# Patient Record
Sex: Female | Born: 1960 | Race: White | Hispanic: No | Smoking: Current every day smoker
Health system: Southern US, Community
[De-identification: ages and names within clinical notes are randomized; demographics above are authoritative.]

## PROBLEM LIST (undated history)

## (undated) DIAGNOSIS — F32A Depression, unspecified: Secondary | ICD-10-CM

## (undated) DIAGNOSIS — M1990 Unspecified osteoarthritis, unspecified site: Secondary | ICD-10-CM

## (undated) DIAGNOSIS — T7840XA Allergy, unspecified, initial encounter: Secondary | ICD-10-CM

## (undated) DIAGNOSIS — R197 Diarrhea, unspecified: Secondary | ICD-10-CM

## (undated) DIAGNOSIS — Z79890 Hormone replacement therapy (postmenopausal): Secondary | ICD-10-CM

## (undated) DIAGNOSIS — R112 Nausea with vomiting, unspecified: Secondary | ICD-10-CM

## (undated) DIAGNOSIS — K5900 Constipation, unspecified: Secondary | ICD-10-CM

## (undated) DIAGNOSIS — I509 Heart failure, unspecified: Secondary | ICD-10-CM

## (undated) DIAGNOSIS — F329 Major depressive disorder, single episode, unspecified: Secondary | ICD-10-CM

## (undated) DIAGNOSIS — Z9889 Other specified postprocedural states: Secondary | ICD-10-CM

## (undated) DIAGNOSIS — M797 Fibromyalgia: Secondary | ICD-10-CM

## (undated) HISTORY — PX: APPENDECTOMY: SHX54

## (undated) HISTORY — PX: VAGINAL HYSTERECTOMY: SUR661

## (undated) HISTORY — DX: Allergy, unspecified, initial encounter: T78.40XA

## (undated) HISTORY — DX: Heart failure, unspecified: I50.9

## (undated) HISTORY — DX: Unspecified osteoarthritis, unspecified site: M19.90

## (undated) HISTORY — PX: CHOLECYSTECTOMY: SHX55

## (undated) HISTORY — PX: RADICAL VAGINAL HYSTERECTOMY: SUR662

## (undated) HISTORY — PX: TONSILLECTOMY: SUR1361

## (undated) HISTORY — PX: COLONOSCOPY: SHX174

---

## 2006-01-17 ENCOUNTER — Ambulatory Visit: Payer: Self-pay | Admitting: Family Medicine

## 2006-04-16 ENCOUNTER — Ambulatory Visit: Payer: Self-pay | Admitting: Family Medicine

## 2006-04-25 ENCOUNTER — Ambulatory Visit: Payer: Self-pay | Admitting: Family Medicine

## 2006-07-31 ENCOUNTER — Ambulatory Visit: Payer: Self-pay | Admitting: Family Medicine

## 2006-09-30 ENCOUNTER — Ambulatory Visit: Payer: Self-pay | Admitting: Family Medicine

## 2006-10-07 ENCOUNTER — Ambulatory Visit: Payer: Self-pay | Admitting: Family Medicine

## 2006-12-31 ENCOUNTER — Ambulatory Visit: Payer: Self-pay | Admitting: Family Medicine

## 2007-02-11 ENCOUNTER — Ambulatory Visit: Payer: Self-pay | Admitting: Family Medicine

## 2011-03-08 ENCOUNTER — Other Ambulatory Visit: Payer: Self-pay

## 2012-01-24 ENCOUNTER — Observation Stay (HOSPITAL_COMMUNITY)
Admission: EM | Admit: 2012-01-24 | Discharge: 2012-01-24 | Disposition: A | Discharge status: Home / Self Care | Payer: Self-pay | Attending: Emergency Medicine | Admitting: Emergency Medicine

## 2012-01-24 ENCOUNTER — Observation Stay (HOSPITAL_COMMUNITY): Payer: Self-pay

## 2012-01-24 ENCOUNTER — Emergency Department (HOSPITAL_COMMUNITY): Payer: Self-pay

## 2012-01-24 ENCOUNTER — Encounter (HOSPITAL_COMMUNITY): Payer: Self-pay | Admitting: Emergency Medicine

## 2012-01-24 DIAGNOSIS — R079 Chest pain, unspecified: Principal | ICD-10-CM

## 2012-01-24 DIAGNOSIS — R42 Dizziness and giddiness: Secondary | ICD-10-CM | POA: Insufficient documentation

## 2012-01-24 HISTORY — DX: Fibromyalgia: M79.7

## 2012-01-24 HISTORY — DX: Hormone replacement therapy: Z79.890

## 2012-01-24 LAB — DIFFERENTIAL
Basophils Absolute: 0 10*3/uL (ref 0.0–0.1)
Basophils Relative: 1 % (ref 0–1)
Eosinophils Absolute: 0.1 10*3/uL (ref 0.0–0.7)
Monocytes Absolute: 0.5 10*3/uL (ref 0.1–1.0)
Neutro Abs: 3.3 10*3/uL (ref 1.7–7.7)
Neutrophils Relative %: 51 % (ref 43–77)

## 2012-01-24 LAB — BASIC METABOLIC PANEL
BUN: 12 mg/dL (ref 6–23)
Calcium: 8.9 mg/dL (ref 8.4–10.5)
Chloride: 107 mEq/L (ref 96–112)
Creatinine, Ser: 1.01 mg/dL (ref 0.50–1.10)
GFR calc Af Amer: 73 mL/min — ABNORMAL LOW (ref >90)
GFR calc non Af Amer: 63 mL/min — ABNORMAL LOW (ref >90)

## 2012-01-24 LAB — CBC
HCT: 35.4 % — ABNORMAL LOW (ref 36.0–46.0)
MCH: 33.1 pg (ref 26.0–34.0)
MCHC: 34.7 g/dL (ref 30.0–36.0)
RDW: 12.6 % (ref 11.5–15.5)

## 2012-01-24 LAB — TROPONIN I: Troponin I: <0.3 ng/mL (ref <0.30)

## 2012-01-24 MED ORDER — SODIUM CHLORIDE 0.9 % IV SOLN
1000.0000 mL | INTRAVENOUS | Status: DC
  Administered 2012-01-24: 1000 mL via INTRAVENOUS

## 2012-01-24 MED ORDER — IOHEXOL 350 MG/ML SOLN
80.0000 mL | Freq: Once | INTRAVENOUS | Status: AC | PRN
  Administered 2012-01-24: 80 mL via INTRAVENOUS

## 2012-01-24 MED ORDER — ASPIRIN 81 MG PO TBEC: 81.0000 mg | DELAYED_RELEASE_TABLET | Freq: Every day | ORAL | Status: AC

## 2012-01-24 MED ORDER — METOPROLOL TARTRATE 1 MG/ML IV SOLN
INTRAVENOUS | Status: AC
  Filled 2012-01-24: qty 15

## 2012-01-24 MED ORDER — METOPROLOL TARTRATE 25 MG PO TABS
50.0000 mg | ORAL_TABLET | Freq: Once | ORAL | Status: AC
  Administered 2012-01-24: 50 mg via ORAL
  Filled 2012-01-24: qty 2

## 2012-01-24 MED ORDER — NITROGLYCERIN 0.4 MG SL SUBL
0.4000 mg | SUBLINGUAL_TABLET | SUBLINGUAL | Status: DC | PRN
  Administered 2012-01-24: 0.4 mg via SUBLINGUAL
  Filled 2012-01-24: qty 25

## 2012-01-24 MED ORDER — SODIUM CHLORIDE 0.9 % IV SOLN
Freq: Once | INTRAVENOUS | Status: AC
  Administered 2012-01-24: 12:00:00 via INTRAVENOUS

## 2012-01-24 MED ORDER — NITROGLYCERIN 0.4 MG SL SUBL
0.4000 mg | SUBLINGUAL_TABLET | Freq: Once | SUBLINGUAL | Status: AC
  Administered 2012-01-24: 0.4 mg via SUBLINGUAL

## 2012-01-24 NOTE — ED Provider Notes (Signed)
Medical screening examination/treatment/procedure(s) were performed by non-physician practitioner and as supervising physician I was immediately available for consultation/collaboration.   Gavin Pound. Oletta Lamas, MD 01/24/12 1610

## 2012-01-24 NOTE — ED Provider Notes (Signed)
I have seen and examined this patient with the resident.  I agree with the resident's note, assessment and plan except as indicated.    Patient with 2 episodes of chest pain yesterday and today.  She did have some associated nausea.  They may be somewhat worse with exertion but patient is unclear.  She has no chest pain at this time.  She has a normal EKG.  Patient is low risk for coronary disease except for the fact that she smokes.  Given that patient has a normal EKG and is low risk but does have a story that is somewhat typical for ACS we are significantly concerned to obtain further studies on this patient.  If her troponins are negative we will plan to place her in the CDU chest pain protocol to obtain a coronary CTA patient's BMI is low enough, her heart weight is in the 50s to 60s, she has no IV dye allergy, patient is amenable to this plan.  Nat Christen, MD 01/24/12 1236

## 2012-01-24 NOTE — ED Provider Notes (Signed)
Need delta troponin, follows with coronary ct to r/o cardiac related cp.    3:04 PM Pt currently CP free.  Delta troponin unremarkable.  Coronary CT ordered.  Metoprolol given.  Care discussed with Felicie Morn, NP who will continue management.    Fayrene Helper, PA-C 01/24/12 1505

## 2012-01-24 NOTE — ED Notes (Signed)
Pt transported to Xray. 

## 2012-01-24 NOTE — ED Notes (Signed)
Pt returned from xray

## 2012-01-24 NOTE — ED Notes (Addendum)
Pt with substernal chest pain, non-radiating that began yesterday after recent work stressors. Pain worse with deep inspiration in left chest. Today experienced some nausea but no vomiting, near syncope episode prior to EMS arrival. 12 lead ekg showed sinus rhythm. Patient hyperventilating on arrival to ED. CBG 137. Smoker. Lost 18lbs since Jan. Recently treated for bronchitis. 18g RAC. 324mg  ASA PTA, 2 SL nitro changed pain from 5 to 3 in route.

## 2012-01-24 NOTE — ED Notes (Signed)
Pt in ct with RN.

## 2012-01-24 NOTE — Discharge Instructions (Signed)
Chest Pain Observation  It is often hard to give a specific diagnosis for the cause of chest pain. Your symptoms had a chance of being caused by inadequate oxygen delivery to your heart (angina). Angina that is not treated or evaluated can lead to a heart attack (myocardial infarction, MI) or death.  Blood tests, electrocardiograms, and X-rays may have been done to help determine a possible cause of your chest pain. After evaluation and observation, your caregiver has determined that it is unlikely your pain was caused by angina. However, a full evaluation of your pain needs to be completed. You need to follow up with caregivers or diagnostic testing as directed. It is very important to keep your follow-up appointments. Not keeping your follow-up appointments could result in permanent heart damage, disability, or death. If there is any problem keeping your follow-up appointments, you must call your caregiver.  HOME CARE INSTRUCTIONS   Due to the slight chance that your pain could be angina, it is important to follow healthy lifestyle habits and follow your caregiver's treatment plan:   Maintain a healthy weight.   Stay physically active and exercise regularly.   Decrease your salt intake.   Eat a diet low in saturated fats and cholesterol. Avoid foods fried in oil or made with fat. Talk to a dietician to learn about heart healthy foods.   Increase your fiber intake by including whole grains, vegetable, and fruits in your diet.   Avoid situations that cause stress, anger, or depression.   Take medication as advised by your caregiver. Report any side effects to your caregiver. Do not stop medications or adjust the dosages on your own.   Quit smoking. Do not use nicotine patches or gum until you check with your caregiver.   Keep your blood pressure, blood sugar, and cholesterol levels within normal limits.   Limit alcohol intake to no more than 1 drink per day for nonpregnant women and 2 drinks per day for  men.   Stop abusing drugs.  SEEK MEDICAL CARE IF:  You have severe chest pain or pressure which may include symptoms such as:   Pain or pressure in the arms, neck, jaw, or back.   Profuse sweating.   Feeling sick to your stomach (nauseous).   Feeling short of breath while at rest.   Having a fast or irregular heartbeat.   You have chest pain that does not get better after rest or after taking your usual medicine.   You wake from sleep with chest pain.   You feel dizzy, faint, or experience extreme fatigue.   You notice increasing shortness of breath during rest, sleep, or with activity.   You are unable to sleep because you cannot breathe.   You develop a frequent cough or you are coughing up blood.   You have severe back or abdominal pain, are nauseated, or throw up (vomit).   You develop severe weakness, dizziness, fainting, or chills.  Any of these symptoms may represent a serious problem that is an emergency. Do not wait to see if the symptoms will go away. Call your local emergency services (911 in the [place] Do not drive yourself to the hospital.  MAKE SURE YOU:   Understand these instructions.   Will watch your condition.   Will get help right away if you are not doing well or get worse.  Document Released: 09/15/2010 Document Revised: 08/02/2011 Document Reviewed: 09/15/2010  Exit[employer] Patient Information 2012 [employer].

## 2012-01-24 NOTE — ED Provider Notes (Signed)
Patient in CDU under chest pain protocol.  Testing completed, results reviewed and discussed with Dr. Oletta Lamas and with patient.  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  Lungs CTA bilaterally, s1/s2, RRR, no murmur.  Abdomen soft, bowel sounds present.  Strong distal pulses all extremities.  ECG without indication of ischemia.  Patient to follow-up with her PCP ([facility] [place]).  Jimmye Norman, NP 01/24/12 1901

## 2012-01-24 NOTE — ED Notes (Addendum)
Dr. Reche Dixon speaking with pt in CT.

## 2012-01-24 NOTE — ED Notes (Signed)
Pt updated on delays ?

## 2012-01-24 NOTE — ED Provider Notes (Signed)
History     CSN: [id number]  Arrival date & time 01/24/12  1023   None     Chief Complaint  Patient presents with  . Chest Pain    (Consider location/radiation/quality/duration/timing/severity/associated sxs/prior treatment) Patient is a 51 y.o. female presenting with chest pain. The history is provided by the patient.  Chest Pain The chest pain began yesterday. Chest pain occurs intermittently. The chest pain is unchanged. Associated with: nothing. At its most intense, the pain is at 5/10. The pain is currently at 4/10. The severity of the pain is mild. The quality of the pain is described as pressure-like (left chest). The pain does not radiate. Exacerbated by: nothing. Primary symptoms include cough (mild chronic). Pertinent negatives for primary symptoms include no fever, no fatigue, no shortness of breath, no abdominal pain, no nausea, no vomiting and no dizziness. She tried nitroglycerin for the symptoms. Risk factors: smoker.     Past Medical History  Diagnosis Date  . Fibromyalgia   . Bronchitis   . Hormone replacement therapy, postmenopausal     Past Surgical History  Procedure Date  . Vaginal hysterectomy   . Cholecystectomy     History reviewed. No pertinent family history.  History  Substance Use Topics  . Smoking status: Current Everyday Smoker  . Smokeless tobacco: Not on file  . Alcohol Use: Yes     socially    OB History    Grav Para Term Preterm Abortions TAB SAB Ect Mult Living                  Review of Systems  Constitutional: Negative for fever and fatigue.  HENT: Negative for congestion, drooling and neck pain.   Eyes: Negative for pain.  Respiratory: Positive for cough (mild chronic). Negative for shortness of breath.   Cardiovascular: Positive for chest pain.  Gastrointestinal: Negative for nausea, vomiting, abdominal pain and diarrhea.  Genitourinary: Negative for dysuria and hematuria.  Musculoskeletal: Negative for back pain and  gait problem.  Skin: Negative for color change.  Neurological: Negative for dizziness and headaches.  Hematological: Negative for adenopathy.  Psychiatric/Behavioral: Negative for behavioral problems.  All other systems reviewed and are negative.    Allergies  Penicillins  Home Medications   Current Outpatient Rx  Name Route Sig Dispense Refill  . ESTRADIOL 1 MG PO TABS Oral Take 1 mg by mouth daily.    [name] HYDROCODONE-ACETAMINOPHEN 5-500 MG PO TABS Oral Take 1 tablet by mouth every 6 (six) hours as needed. For pain associated with fibromyalgia    . LORAZEPAM 0.5 MG PO TABS Oral Take 0.5 mg by mouth at bedtime as needed. For muscle relaxant associated with fibromyalgia    . MILNACIPRAN HCL 25 MG PO TABS Oral Take 25 mg by mouth 2 (two) times daily.      BP 117/73  Pulse 65  Temp(Src) 97 F (36.1 C) (Oral)  Resp 14  SpO2 97%  Physical Exam  Nursing note and vitals reviewed. Constitutional: She is oriented to person, place, and time. She appears well-developed and well-nourished.  HENT:  Head: Normocephalic.  Mouth/Throat: No oropharyngeal exudate.  Eyes: Conjunctivae and EOM are normal. Pupils are equal, round, and reactive to light.  Neck: Normal range of motion. Neck supple.  Cardiovascular: Normal rate, regular rhythm, normal heart sounds and intact distal pulses.  Exam reveals no gallop and no friction rub.   No murmur heard. Pulmonary/Chest: Effort normal and breath sounds normal. No respiratory distress. She has no  wheezes.  Abdominal: Soft. Bowel sounds are normal. She exhibits no distension. There is no tenderness. There is no rebound and no guarding.  Musculoskeletal: Normal range of motion. She exhibits no edema and no tenderness.  Neurological: She is alert and oriented to person, place, and time.  Skin: Skin is warm and dry.  Psychiatric: She has a normal mood and affect. Her behavior is normal.    ED Course  Procedures (including critical care time)  Labs  Reviewed - No data to display No results found.   No diagnosis found.   Date: 01/24/2012  Rate: 66  Rhythm: normal sinus rhythm  QRS Axis: normal  Intervals: normal  ST/T Wave abnormalities: normal  Conduction Disutrbances:none  Narrative Interpretation: No ST or T wave changes cw ischemia  Old EKG Reviewed: none available    MDM  11:07 AM 51 y.o. female w hx of tobacco abuse pw cp and mild dizziness that began yesterday. Pt notes left sided chest pressure lasting for an hour yesterday and intermittent this morning now 4/10. Pt AFVSS here, got 2 nitro en route w/ mild relief. Will get labs for cp r/o.   Will get delta trop and send to CDU for coronary CT scan. Will plan on d/c home if benign and delta trop negative.  Clinical Impression 1. Chest pain           Purvis Sheffield, MD 01/24/12 [phone]

## 2012-02-25 ENCOUNTER — Encounter: Payer: Self-pay | Admitting: *Deleted

## 2012-02-25 ENCOUNTER — Encounter: Payer: Self-pay | Admitting: Cardiology

## 2012-02-25 ENCOUNTER — Ambulatory Visit (INDEPENDENT_AMBULATORY_CARE_PROVIDER_SITE_OTHER): Payer: PRIVATE HEALTH INSURANCE | Admitting: Cardiology

## 2012-02-25 VITALS — BP 128/80 | HR 71 | Ht 64.5 in | Wt 120.8 lb

## 2012-02-25 DIAGNOSIS — Z720 Tobacco use: Secondary | ICD-10-CM | POA: Insufficient documentation

## 2012-02-25 DIAGNOSIS — E785 Hyperlipidemia, unspecified: Secondary | ICD-10-CM | POA: Insufficient documentation

## 2012-02-25 DIAGNOSIS — I2510 Atherosclerotic heart disease of native coronary artery without angina pectoris: Secondary | ICD-10-CM | POA: Insufficient documentation

## 2012-02-25 NOTE — Assessment & Plan Note (Signed)
We discussed a specific strategy for tobacco cessation.  (Greater than three minutes discussing tobacco cessation.)  

## 2012-02-25 NOTE — Assessment & Plan Note (Signed)
She is nonobstructive disease as described. This is not the etiology of her chest pain. She however needs aggressive primary risk reduction we discussed this at length. I will plan an exercise treadmill test in 6 months. If she has any change in her chest pain or increasing symptoms she should let me know or present to the emergency room.

## 2012-02-25 NOTE — Assessment & Plan Note (Signed)
She mentions being told that her lipids were elevated in the hospital. However, I could not find a lipid reading. She had this done recently by her primary provider. I would suggest a goal LDL less than 100 and HDL of greater than 40. I will defer to Josue Hector, MD

## 2012-02-25 NOTE — Patient Instructions (Addendum)
Your physician has requested that you have an exercise tolerance test in 6 months at [facility]. Office. For further information please visit [email or web address] Please also follow instruction sheet, as given.  Your physician recommends that you continue on your current medications as directed. Please refer to the Current Medication list given to you today.

## 2012-02-25 NOTE — Progress Notes (Signed)
HPI The patient presents as a new patient for evaluation of an abnormal cardiac CT. She has no history of coronary disease prior to presenting to the emergency room on 5/30. On 5/29 she had presyncope with a rapid heart rate and dizziness. The next day she developed chest discomfort. Severe. It was left-sided. There was no radiation. No associated symptoms. She was rushed to the emergency room enzymes were negative and her EKG was nonacute. Cardiac CT angiography didn't demonstrate coronary calcifications advanced for her age. She had nonobstructive LAD plaque. Since going home she's had intermittent chest discomfort that comes on sporadically. He has been mild. She cannot bring it on with activities. She will walking incline gets some fatigue and shortness of breath but this is baseline. She's not describing any resting symptoms such as PND or orthopnea. She's no longer had any presyncope palpitations or syncope.  Allergies  Allergen Reactions  . Penicillins Hives and Rash    Current Outpatient Prescriptions  Medication Sig Dispense Refill  . aspirin (ASPIRIN EC) 81 MG EC tablet Take 1 tablet (81 mg total) by mouth daily. Swallow whole.  30 tablet  0  . estradiol (ESTRACE) 1 MG tablet Take 1 mg by mouth daily.      [name] HYDROcodone-acetaminophen (VICODIN) 5-500 MG per tablet Take 1 tablet by mouth every 6 (six) hours as needed. For pain associated with fibromyalgia      . LORazepam (ATIVAN) 0.5 MG tablet Take 0.5 mg by mouth at bedtime as needed. For muscle relaxant associated with fibromyalgia      . Milnacipran HCl (SAVELLA) 25 MG TABS Take 25 mg by mouth 2 (two) times daily.        Past Medical History  Diagnosis Date  . Fibromyalgia   . Bronchitis   . Hormone replacement therapy, postmenopausal     Past Surgical History  Procedure Date  . Vaginal hysterectomy   . Cholecystectomy     No family history on file.  History   Social History  . Marital Status: Single    Spouse  Name: N/A    Number of Children: N/A  . Years of Education: N/A   Occupational History  . Not on file.   Social History Main Topics  . Smoking status: Current Everyday Smoker -- 0.3 packs/day for 8 years    Types: Cigarettes  . Smokeless tobacco: Not on file  . Alcohol Use: Yes     socially  . Drug Use: No  . Sexually Active: Not on file   Other Topics Concern  . Not on file   Social History Narrative  . No narrative on file    ROS:  Positive for dizziness, syncope, what muscle weakness, decreased weight, decreased energy level, difficulty sleeping, fibromyalgia.  Otherwise as stated in the HPI and negative for all other systems.   PHYSICAL EXAM BP 128/80  Pulse 71  Ht 5' 4.5" (1.638 m)  Wt 120 lb 12.8 oz (54.795 kg)  BMI 20.42 kg/m2 GENERAL:  Well appearing HEENT:  Pupils equal round and reactive, fundi not visualized, oral mucosa unremarkable NECK:  No jugular venous distention, waveform within normal limits, carotid upstroke brisk and symmetric, no bruits, no thyromegaly LYMPHATICS:  No cervical, inguinal adenopathy LUNGS:  Clear to auscultation bilaterally BACK:  No CVA tenderness CHEST:  Unremarkable HEART:  PMI not displaced or sustained,S1 and S2 within normal limits, no S3, no S4, no clicks, no rubs, no murmurs ABD:  Flat, positive bowel sounds normal in  frequency in pitch, no bruits, no rebound, no guarding, no midline pulsatile mass, no hepatomegaly, no splenomegaly EXT:  2 plus pulses throughout, no edema, no cyanosis no clubbing SKIN:  No rashes no nodules NEURO:  Cranial nerves II through XII grossly intact, motor grossly intact throughout PSYCH:  Cognitively intact, oriented to person place and time  EKG:  Sinus rhythm, rate 66, axis within normal limits, intervals within normal limits, no acute ST-T wave changes.  01/24/12  ASSESSMENT AND PLAN

## 2012-09-23 ENCOUNTER — Encounter: Payer: PRIVATE HEALTH INSURANCE | Admitting: Physician Assistant

## 2012-10-17 ENCOUNTER — Encounter: Payer: PRIVATE HEALTH INSURANCE | Admitting: Nurse Practitioner

## 2012-11-11 ENCOUNTER — Ambulatory Visit: Payer: PRIVATE HEALTH INSURANCE | Admitting: Cardiology

## 2014-04-29 DIAGNOSIS — M797 Fibromyalgia: Secondary | ICD-10-CM | POA: Insufficient documentation

## 2014-11-18 ENCOUNTER — Ambulatory Visit (HOSPITAL_COMMUNITY): Payer: PRIVATE HEALTH INSURANCE

## 2014-11-18 ENCOUNTER — Ambulatory Visit
Admission: RE | Admit: 2014-11-18 | Discharge: 2014-11-18 | Disposition: A | Discharge status: Home / Self Care | Payer: BLUE CROSS/BLUE SHIELD | Source: Ambulatory Visit | Attending: Gastroenterology | Admitting: Gastroenterology

## 2014-11-18 ENCOUNTER — Other Ambulatory Visit (HOSPITAL_COMMUNITY): Payer: Self-pay | Admitting: Gastroenterology

## 2014-11-18 ENCOUNTER — Other Ambulatory Visit: Payer: Self-pay | Admitting: Gastroenterology

## 2014-11-18 DIAGNOSIS — Z1211 Encounter for screening for malignant neoplasm of colon: Secondary | ICD-10-CM

## 2014-11-18 DIAGNOSIS — Z9889 Other specified postprocedural states: Secondary | ICD-10-CM

## 2015-05-23 DIAGNOSIS — D519 Vitamin B12 deficiency anemia, unspecified: Secondary | ICD-10-CM | POA: Insufficient documentation

## 2015-05-23 DIAGNOSIS — E785 Hyperlipidemia, unspecified: Secondary | ICD-10-CM | POA: Insufficient documentation

## 2015-07-12 ENCOUNTER — Other Ambulatory Visit: Payer: Self-pay | Admitting: Surgery

## 2015-07-12 MED ORDER — GENTAMICIN SULFATE 40 MG/ML IJ SOLN: 5.0000 mg/kg | INTRAVENOUS | Status: DC

## 2015-07-12 MED ORDER — DEXTROSE 5 % IV SOLN: 900.0000 mg | INTRAVENOUS | Status: DC

## 2015-07-27 ENCOUNTER — Encounter (HOSPITAL_COMMUNITY): Payer: Self-pay | Admitting: *Deleted

## 2015-07-27 MED ORDER — CHLORHEXIDINE GLUCONATE CLOTH 2 % EX PADS: 6.0000 | MEDICATED_PAD | Freq: Once | CUTANEOUS | Status: DC

## 2015-07-27 NOTE — H&P (Signed)
Zelie Pilgrim 07/12/2015 9:36 AM Location: [facility] Patient #: [id number] DOB: Oct 05, 1960 Divorced / Language: Cleophus Molt / Race: White Female   History of Present Illness (Marnita Poirier A. Ninfa Linden MD; 07/12/2015 10:13 AM) The patient is a 54 year old female who presents with a colonic polyp. She is referred by Teena Irani. She had seen him for a screening colonoscopy in March of this year. She had a very redundant sigmoid colon and the colonoscopy was inadequate. She underwent a virtual colonoscopy with CAT scan guidance which demonstrated a possible 9 mm polyp in the sigmoid colon. Again this also demonstrated a very redundant sigmoid colon. He again attempted a colonoscopy on November 3 but was unable to get past the very redundant tortuous colon. A specific mass was not seen. The patient reports that over the last several months she has had pain with bowel movements and left lower quadrant abdominal pain. She has had no nausea or vomiting. She was able to tolerate a bowel prep with both endoscopies. She is otherwise without complaints.   Allergies (Sonya Bynum, CMA; 07/12/2015 9:37 AM) PenicillAMINE *ASSORTED CLASSES*  Medication History (Sonya Bynum, CMA; 07/12/2015 9:39 AM) Hydrocodone-Acetaminophen (5-325MG  Tablet, Oral as needed) Active. Vitamin D (Ergocalciferol) (50000UNIT Capsule, Oral) Active. Ativan (0.5MG  Tablet, Oral) Active. Estradiol (1MG  Tablet, Oral) Active. Vitamin B12 (100MCG Tablet, Oral) Active. Medications Reconciled    Review of Systems ([place]; 07/12/2015 9:36 AM) General Present- Fatigue and Weight Loss. Not Present- Appetite Loss, Chills, Fever, Night Sweats and Weight Gain. Skin Not Present- Change in Wart/Mole, Dryness, Hives, Jaundice, New Lesions, Non-Healing Wounds, Rash and Ulcer. Respiratory Present- Snoring. Not Present- Bloody sputum, Chronic Cough, Difficulty Breathing and Wheezing. Gastrointestinal Present- Bloating,  Change in Bowel Habits, Chronic diarrhea, Constipation, Gets full quickly at meals and Rectal Pain. Not Present- Abdominal Pain, Bloody Stool, Difficulty Swallowing, Excessive gas, Hemorrhoids, Indigestion, Nausea and Vomiting. Female Genitourinary Not Present- Frequency, Nocturia, Painful Urination, Pelvic Pain and Urgency. Musculoskeletal Present- Joint Pain and Muscle Weakness. Not Present- Back Pain, Joint Stiffness, Muscle Pain and Swelling of Extremities. Neurological Not Present- Decreased Memory, Fainting, Headaches, Numbness, Seizures, Tingling, Tremor, Trouble walking and Weakness.  Vitals (Sonya Bynum CMA; 07/12/2015 9:37 AM) 07/12/2015 9:36 AM Weight: 143 lb Height: 64in Body Surface Area: 1.7 m Body Mass Index: 24.55 kg/m  Temp.: 79F(Temporal)  Pulse: 79 (Regular)  BP: 124/74 (Sitting, Left Arm, Standard)       Physical Exam (Milos Milligan A. Ninfa Linden MD; 07/12/2015 10:14 AM) General Mental Status-Alert. General Appearance-Consistent with stated age. Hydration-Well hydrated. Voice-Normal.  Head and Neck Head-normocephalic, atraumatic with no lesions or palpable masses. Trachea-midline. Thyroid Gland Characteristics - normal size and consistency.  Eye Eyeball - Bilateral-Extraocular movements intact. Sclera/Conjunctiva - Bilateral-No scleral icterus.  Chest and Lung Exam Chest and lung exam reveals -quiet, even and easy respiratory effort with no use of accessory muscles and on auscultation, normal breath sounds, no adventitious sounds and normal vocal resonance. Inspection Chest Wall - Normal. Back - normal.  Breast Breast - Left-Symmetric, Non Tender, No Biopsy scars, no Dimpling, No Inflammation, No Lumpectomy scars, No Mastectomy scars, No Peau d' Orange. Breast - Right-Symmetric, Non Tender, No Biopsy scars, no Dimpling, No Inflammation, No Lumpectomy scars, No Mastectomy scars, No Peau d' Orange. Breast Lump-No Palpable  Breast Mass.  Cardiovascular Cardiovascular examination reveals -normal heart sounds, regular rate and rhythm with no murmurs and normal pedal pulses bilaterally.  Abdomen Inspection Inspection of the abdomen reveals - No Hernias. Skin - Scar - no  surgical scars. Palpation/Percussion Palpation and Percussion of the abdomen reveal - Soft, No Rebound tenderness, No Rigidity (guarding) and No hepatosplenomegaly. Tenderness - Left Lower Quadrant. Note: There is no fullness in the abdomen but there is tenderness in the left lower quadrant. She has a well-healed upper midline incision. She also has a well-healed Pfannenstiel incision. Auscultation Auscultation of the abdomen reveals - Bowel sounds normal.  Neurologic Neurologic evaluation reveals -alert and oriented x 3 with no impairment of recent or remote memory. Mental Status-Normal.  Musculoskeletal Normal Exam - Left-Upper Extremity Strength Normal and Lower Extremity Strength Normal. Normal Exam - Right-Upper Extremity Strength Normal and Lower Extremity Strength Normal.  Lymphatic - Did not examine.    Assessment & Plan (Devota Viruet A. 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 I agree that resection of this redundant sigmoid colon as necessary to roll malignancy and to improve her symptoms and prevent an emergent blockage and need for emergent surgery and colostomy. I discussed surgery with her and her family in detail. I gave him literature regarding surgery. I discussed the risks of surgery which includes but is not limited to bleeding, infection, injury to surrounding structures, findings of malignancy, anastomotic leak, anastomotic breakdown, need for further surgery, cardiopulmonary issues, DVT, etc. Preoperative bowel preparation will be given. She understands and  wishes to proceed with surgery Current Plans Pt Education - Pamphlet Given - Colorectal Surgery: discussed with patient and provided information.

## 2015-07-27 NOTE — Progress Notes (Signed)
Pt denies cardiac history, chest pain or sob.   Pt states she has instructions for her bowel prep for this evening.

## 2015-07-28 ENCOUNTER — Encounter (HOSPITAL_COMMUNITY): Payer: Self-pay | Admitting: *Deleted

## 2015-07-28 ENCOUNTER — Inpatient Hospital Stay (HOSPITAL_COMMUNITY): Payer: BLUE CROSS/BLUE SHIELD | Admitting: Anesthesiology

## 2015-07-28 ENCOUNTER — Encounter (HOSPITAL_COMMUNITY)
Admission: RE | Disposition: A | Discharge status: Home / Self Care | Payer: Self-pay | Source: Ambulatory Visit | Attending: Surgery

## 2015-07-28 ENCOUNTER — Inpatient Hospital Stay (HOSPITAL_COMMUNITY)
Admission: RE | Admit: 2015-07-28 | Discharge: 2015-08-04 | DRG: 336 | Disposition: A | Discharge status: Home / Self Care | Payer: BLUE CROSS/BLUE SHIELD | Source: Ambulatory Visit | Attending: Surgery | Admitting: Surgery

## 2015-07-28 DIAGNOSIS — K567 Ileus, unspecified: Secondary | ICD-10-CM | POA: Diagnosis not present

## 2015-07-28 DIAGNOSIS — M797 Fibromyalgia: Secondary | ICD-10-CM | POA: Diagnosis present

## 2015-07-28 DIAGNOSIS — Z9889 Other specified postprocedural states: Secondary | ICD-10-CM

## 2015-07-28 DIAGNOSIS — K660 Peritoneal adhesions (postprocedural) (postinfection): Secondary | ICD-10-CM | POA: Diagnosis present

## 2015-07-28 DIAGNOSIS — I2510 Atherosclerotic heart disease of native coronary artery without angina pectoris: Secondary | ICD-10-CM | POA: Diagnosis present

## 2015-07-28 DIAGNOSIS — Z87891 Personal history of nicotine dependence: Secondary | ICD-10-CM

## 2015-07-28 DIAGNOSIS — F329 Major depressive disorder, single episode, unspecified: Secondary | ICD-10-CM | POA: Diagnosis present

## 2015-07-28 DIAGNOSIS — D125 Benign neoplasm of sigmoid colon: Secondary | ICD-10-CM | POA: Diagnosis present

## 2015-07-28 HISTORY — DX: Diarrhea, unspecified: R19.7

## 2015-07-28 HISTORY — DX: Depression, unspecified: F32.A

## 2015-07-28 HISTORY — PX: LYSIS OF ADHESION: SHX5961

## 2015-07-28 HISTORY — DX: Other specified postprocedural states: Z98.890

## 2015-07-28 HISTORY — PX: LAPAROTOMY: SHX154

## 2015-07-28 HISTORY — DX: Major depressive disorder, single episode, unspecified: F32.9

## 2015-07-28 HISTORY — DX: Nausea with vomiting, unspecified: R11.2

## 2015-07-28 HISTORY — DX: Constipation, unspecified: K59.00

## 2015-07-28 HISTORY — PX: ABDOMINAL ADHESION SURGERY: SHX90

## 2015-07-28 HISTORY — DX: Other specified postprocedural states: R11.2

## 2015-07-28 LAB — BASIC METABOLIC PANEL
Anion gap: 9 (ref 5–15)
BUN: 11 mg/dL (ref 6–20)
CALCIUM: 9.3 mg/dL (ref 8.9–10.3)
CO2: 24 mmol/L (ref 22–32)
CREATININE: 0.99 mg/dL (ref 0.44–1.00)
Chloride: 106 mmol/L (ref 101–111)
GFR calc non Af Amer: >60 mL/min (ref >60)
Glucose, Bld: 109 mg/dL — ABNORMAL HIGH (ref 65–99)
Potassium: 4.2 mmol/L (ref 3.5–5.1)
SODIUM: 139 mmol/L (ref 135–145)

## 2015-07-28 LAB — CBC
HCT: 42.9 % (ref 36.0–46.0)
Hemoglobin: 14.6 g/dL (ref 12.0–15.0)
MCH: 32.3 pg (ref 26.0–34.0)
MCHC: 34 g/dL (ref 30.0–36.0)
MCV: 94.9 fL (ref 78.0–100.0)
PLATELETS: 242 10*3/uL (ref 150–400)
RBC: 4.52 MIL/uL (ref 3.87–5.11)
RDW: 12.8 % (ref 11.5–15.5)
WBC: 8 10*3/uL (ref 4.0–10.5)

## 2015-07-28 LAB — ABO/RH: ABO/RH(D): B NEG

## 2015-07-28 LAB — TYPE AND SCREEN
ABO/RH(D): B NEG
Antibody Screen: NEGATIVE

## 2015-07-28 SURGERY — LAPAROTOMY, EXPLORATORY
Anesthesia: General | Site: Abdomen

## 2015-07-28 MED ORDER — LIDOCAINE HCL (CARDIAC) 20 MG/ML IV SOLN
INTRAVENOUS | Status: AC
  Filled 2015-07-28: qty 5

## 2015-07-28 MED ORDER — SCOPOLAMINE 1 MG/3DAYS TD PT72
MEDICATED_PATCH | TRANSDERMAL | Status: AC
  Filled 2015-07-28: qty 1

## 2015-07-28 MED ORDER — HYDROMORPHONE 1 MG/ML IV SOLN
INTRAVENOUS | Status: DC
  Administered 2015-07-28: 16:00:00 via INTRAVENOUS
  Administered 2015-07-28: 4.13 mg via INTRAVENOUS
  Administered 2015-07-29: 0.9 mg via INTRAVENOUS
  Administered 2015-07-29: 1.8 mg via INTRAVENOUS
  Administered 2015-07-29: 3.3 mg via INTRAVENOUS
  Administered 2015-07-29: 2.4 mg via INTRAVENOUS
  Administered 2015-07-29: 2.1 mg via INTRAVENOUS
  Administered 2015-07-29: 1.2 mg via INTRAVENOUS
  Administered 2015-07-30: 1.8 mg via INTRAVENOUS
  Administered 2015-07-30 (×2): 0.6 mg via INTRAVENOUS
  Administered 2015-07-30: 2.4 mg via INTRAVENOUS
  Administered 2015-07-30: 0.9 mg via INTRAVENOUS
  Administered 2015-07-30: 07:00:00 via INTRAVENOUS
  Administered 2015-07-30: 0.3 mg via INTRAVENOUS
  Administered 2015-07-31: 0.6 mg via INTRAVENOUS
  Administered 2015-07-31: 0 mg via INTRAVENOUS
  Filled 2015-07-28: qty 25

## 2015-07-28 MED ORDER — LIDOCAINE HCL (CARDIAC) 20 MG/ML IV SOLN
INTRAVENOUS | Status: DC | PRN
  Administered 2015-07-28: 40 mg via INTRAVENOUS

## 2015-07-28 MED ORDER — HYDROMORPHONE 1 MG/ML IV SOLN
INTRAVENOUS | Status: AC
  Filled 2015-07-28: qty 25

## 2015-07-28 MED ORDER — ONDANSETRON 4 MG PO TBDP: 4.0000 mg | ORAL_TABLET | Freq: Four times a day (QID) | ORAL | Status: DC | PRN

## 2015-07-28 MED ORDER — PANTOPRAZOLE SODIUM 40 MG IV SOLR
40.0000 mg | Freq: Every day | INTRAVENOUS | Status: DC
  Administered 2015-07-28 – 2015-08-03 (×7): 40 mg via INTRAVENOUS
  Filled 2015-07-28 (×7): qty 40

## 2015-07-28 MED ORDER — GENTAMICIN SULFATE 40 MG/ML IJ SOLN
5.0000 mg/kg | INTRAVENOUS | Status: AC
  Administered 2015-07-28: 30 mg via INTRAVENOUS
  Filled 2015-07-28 (×2): qty 8

## 2015-07-28 MED ORDER — GENTAMICIN IN SALINE 1.6-0.9 MG/ML-% IV SOLN
INTRAVENOUS | Status: AC
  Filled 2015-07-28: qty 50

## 2015-07-28 MED ORDER — MIDAZOLAM HCL 2 MG/2ML IJ SOLN
INTRAMUSCULAR | Status: AC
  Filled 2015-07-28: qty 2

## 2015-07-28 MED ORDER — 0.9 % SODIUM CHLORIDE (POUR BTL) OPTIME
TOPICAL | Status: DC | PRN
  Administered 2015-07-28 (×2): 1000 mL

## 2015-07-28 MED ORDER — ALVIMOPAN 12 MG PO CAPS
ORAL_CAPSULE | ORAL | Status: AC
  Filled 2015-07-28: qty 1

## 2015-07-28 MED ORDER — LACTATED RINGERS IV SOLN
INTRAVENOUS | Status: DC
  Administered 2015-07-28: 50 mL/h via INTRAVENOUS

## 2015-07-28 MED ORDER — PROMETHAZINE HCL 25 MG/ML IJ SOLN: 6.2500 mg | INTRAMUSCULAR | Status: DC | PRN

## 2015-07-28 MED ORDER — CLINDAMYCIN PHOSPHATE 900 MG/50ML IV SOLN
INTRAVENOUS | Status: AC
  Administered 2015-07-28: 900 mg via INTRAVENOUS
  Filled 2015-07-28: qty 50

## 2015-07-28 MED ORDER — ROCURONIUM BROMIDE 100 MG/10ML IV SOLN
INTRAVENOUS | Status: DC | PRN
  Administered 2015-07-28: 40 mg via INTRAVENOUS

## 2015-07-28 MED ORDER — HYDROMORPHONE HCL 1 MG/ML IJ SOLN: 0.2500 mg | INTRAMUSCULAR | Status: DC | PRN

## 2015-07-28 MED ORDER — DIPHENHYDRAMINE HCL 12.5 MG/5ML PO ELIX: 12.5000 mg | ORAL_SOLUTION | Freq: Four times a day (QID) | ORAL | Status: DC | PRN

## 2015-07-28 MED ORDER — GLYCOPYRROLATE 0.2 MG/ML IJ SOLN
INTRAMUSCULAR | Status: AC
  Filled 2015-07-28: qty 2

## 2015-07-28 MED ORDER — LACTATED RINGERS IV SOLN
INTRAVENOUS | Status: DC | PRN
  Administered 2015-07-28 (×2): via INTRAVENOUS

## 2015-07-28 MED ORDER — CHLORHEXIDINE GLUCONATE 0.12 % MT SOLN
15.0000 mL | Freq: Two times a day (BID) | OROMUCOSAL | Status: DC
  Administered 2015-07-28 – 2015-07-29 (×3): 15 mL via OROMUCOSAL
  Filled 2015-07-28 (×3): qty 15

## 2015-07-28 MED ORDER — MIDAZOLAM HCL 5 MG/5ML IJ SOLN
INTRAMUSCULAR | Status: DC | PRN
  Administered 2015-07-28: 2 mg via INTRAVENOUS

## 2015-07-28 MED ORDER — CETYLPYRIDINIUM CHLORIDE 0.05 % MT LIQD
7.0000 mL | Freq: Two times a day (BID) | OROMUCOSAL | Status: DC
  Administered 2015-07-29 (×2): 7 mL via OROMUCOSAL

## 2015-07-28 MED ORDER — LORAZEPAM 2 MG/ML IJ SOLN
0.5000 mg | INTRAMUSCULAR | Status: AC | PRN
  Administered 2015-07-28: 0.5 mg via INTRAVENOUS

## 2015-07-28 MED ORDER — SCOPOLAMINE 1 MG/3DAYS TD PT72
1.0000 | MEDICATED_PATCH | TRANSDERMAL | Status: DC
  Administered 2015-07-28: 1.5 mg via TRANSDERMAL

## 2015-07-28 MED ORDER — ENOXAPARIN SODIUM 40 MG/0.4ML ~~LOC~~ SOLN
40.0000 mg | SUBCUTANEOUS | Status: DC
  Administered 2015-07-29 – 2015-08-03 (×6): 40 mg via SUBCUTANEOUS
  Filled 2015-07-28 (×6): qty 0.4

## 2015-07-28 MED ORDER — FENTANYL CITRATE (PF) 100 MCG/2ML IJ SOLN
INTRAMUSCULAR | Status: DC | PRN
  Administered 2015-07-28 (×7): 50 ug via INTRAVENOUS
  Administered 2015-07-28: 100 ug via INTRAVENOUS
  Administered 2015-07-28: 50 ug via INTRAVENOUS

## 2015-07-28 MED ORDER — LORAZEPAM 2 MG/ML IJ SOLN
INTRAMUSCULAR | Status: AC
  Filled 2015-07-28: qty 1

## 2015-07-28 MED ORDER — NEOSTIGMINE METHYLSULFATE 10 MG/10ML IV SOLN
INTRAVENOUS | Status: DC | PRN
  Administered 2015-07-28: 3 mg via INTRAVENOUS

## 2015-07-28 MED ORDER — FENTANYL CITRATE (PF) 250 MCG/5ML IJ SOLN
INTRAMUSCULAR | Status: AC
  Filled 2015-07-28: qty 5

## 2015-07-28 MED ORDER — DEXAMETHASONE SODIUM PHOSPHATE 4 MG/ML IJ SOLN
INTRAMUSCULAR | Status: DC | PRN
  Administered 2015-07-28: 8 mg via INTRAVENOUS

## 2015-07-28 MED ORDER — ONDANSETRON HCL 4 MG/2ML IJ SOLN
INTRAMUSCULAR | Status: AC
  Filled 2015-07-28: qty 2

## 2015-07-28 MED ORDER — SODIUM CHLORIDE 0.9 % IJ SOLN: 9.0000 mL | INTRAMUSCULAR | Status: DC | PRN

## 2015-07-28 MED ORDER — DIPHENHYDRAMINE HCL 50 MG/ML IJ SOLN: 12.5000 mg | Freq: Four times a day (QID) | INTRAMUSCULAR | Status: DC | PRN

## 2015-07-28 MED ORDER — GLYCOPYRROLATE 0.2 MG/ML IJ SOLN
INTRAMUSCULAR | Status: DC | PRN
  Administered 2015-07-28: 0.4 mg via INTRAVENOUS

## 2015-07-28 MED ORDER — PNEUMOCOCCAL VAC POLYVALENT 25 MCG/0.5ML IJ INJ
0.5000 mL | INJECTION | INTRAMUSCULAR | Status: AC
  Administered 2015-07-29: 0.5 mL via INTRAMUSCULAR
  Filled 2015-07-28: qty 0.5

## 2015-07-28 MED ORDER — ONDANSETRON HCL 4 MG/2ML IJ SOLN
4.0000 mg | Freq: Four times a day (QID) | INTRAMUSCULAR | Status: DC | PRN
  Administered 2015-07-31 – 2015-08-02 (×4): 4 mg via INTRAVENOUS
  Filled 2015-07-28 (×4): qty 2

## 2015-07-28 MED ORDER — ONDANSETRON HCL 4 MG/2ML IJ SOLN
4.0000 mg | Freq: Four times a day (QID) | INTRAMUSCULAR | Status: DC | PRN
  Administered 2015-07-29 – 2015-07-31 (×3): 4 mg via INTRAVENOUS
  Filled 2015-07-28 (×3): qty 2

## 2015-07-28 MED ORDER — PROPOFOL 10 MG/ML IV BOLUS
INTRAVENOUS | Status: DC | PRN
  Administered 2015-07-28: 40 mg via INTRAVENOUS
  Administered 2015-07-28: 160 mg via INTRAVENOUS

## 2015-07-28 MED ORDER — ALVIMOPAN 12 MG PO CAPS
12.0000 mg | ORAL_CAPSULE | Freq: Once | ORAL | Status: AC
  Administered 2015-07-28: 12 mg via ORAL

## 2015-07-28 MED ORDER — POTASSIUM CHLORIDE IN NACL 20-0.9 MEQ/L-% IV SOLN
INTRAVENOUS | Status: DC
  Administered 2015-07-28 – 2015-07-29 (×2): via INTRAVENOUS
  Filled 2015-07-28 (×3): qty 1000

## 2015-07-28 MED ORDER — NALOXONE HCL 0.4 MG/ML IJ SOLN: 0.4000 mg | INTRAMUSCULAR | Status: DC | PRN

## 2015-07-28 MED ORDER — DEXAMETHASONE SODIUM PHOSPHATE 4 MG/ML IJ SOLN
INTRAMUSCULAR | Status: AC
  Filled 2015-07-28: qty 2

## 2015-07-28 MED ORDER — ONDANSETRON HCL 4 MG/2ML IJ SOLN
INTRAMUSCULAR | Status: DC | PRN
  Administered 2015-07-28: 4 mg via INTRAVENOUS

## 2015-07-28 SURGICAL SUPPLY — 59 items
BLADE SURG ROTATE 9660 (MISCELLANEOUS) IMPLANT
CANISTER SUCTION 2500CC (MISCELLANEOUS) ×4 IMPLANT
CHLORAPREP W/TINT 26ML (MISCELLANEOUS) ×4 IMPLANT
COVER MAYO STAND STRL (DRAPES) ×8 IMPLANT
COVER SURGICAL LIGHT HANDLE (MISCELLANEOUS) ×4 IMPLANT
DRAPE INCISE IOBAN 66X45 STRL (DRAPES) IMPLANT
DRAPE LAPAROSCOPIC ABDOMINAL (DRAPES) ×4 IMPLANT
DRAPE PROXIMA HALF (DRAPES) ×8 IMPLANT
DRAPE UTILITY XL STRL (DRAPES) ×8 IMPLANT
DRAPE WARM FLUID 44X44 (DRAPE) ×4 IMPLANT
DRSG OPSITE POSTOP 4X10 (GAUZE/BANDAGES/DRESSINGS) ×4 IMPLANT
DRSG OPSITE POSTOP 4X8 (GAUZE/BANDAGES/DRESSINGS) IMPLANT
ELECT BLADE 6.5 EXT (BLADE) ×4 IMPLANT
ELECT CAUTERY BLADE 6.4 (BLADE) ×8 IMPLANT
ELECT REM PT RETURN 9FT ADLT (ELECTROSURGICAL) ×4
ELECTRODE REM PT RTRN 9FT ADLT (ELECTROSURGICAL) ×2 IMPLANT
GLOVE BIO SURGEON STRL SZ7 (GLOVE) ×4 IMPLANT
GLOVE BIOGEL PI IND STRL 6.5 (GLOVE) ×4 IMPLANT
GLOVE BIOGEL PI IND STRL 7.0 (GLOVE) ×2 IMPLANT
GLOVE BIOGEL PI IND STRL 7.5 (GLOVE) ×2 IMPLANT
GLOVE BIOGEL PI INDICATOR 6.5 (GLOVE) ×4
GLOVE BIOGEL PI INDICATOR 7.0 (GLOVE) ×2
GLOVE BIOGEL PI INDICATOR 7.5 (GLOVE) ×2
GLOVE ECLIPSE 7.5 STRL STRAW (GLOVE) ×4 IMPLANT
GLOVE SURG SIGNA 7.5 PF LTX (GLOVE) ×8 IMPLANT
GOWN STRL REUS W/ TWL LRG LVL3 (GOWN DISPOSABLE) ×8 IMPLANT
GOWN STRL REUS W/ TWL XL LVL3 (GOWN DISPOSABLE) ×4 IMPLANT
GOWN STRL REUS W/TWL LRG LVL3 (GOWN DISPOSABLE) ×8
GOWN STRL REUS W/TWL XL LVL3 (GOWN DISPOSABLE) ×4
KIT BASIN OR (CUSTOM PROCEDURE TRAY) ×4 IMPLANT
KIT ROOM TURNOVER OR (KITS) ×4 IMPLANT
LEGGING LITHOTOMY PAIR STRL (DRAPES) ×4 IMPLANT
LIGASURE IMPACT 36 18CM CVD LR (INSTRUMENTS) ×4 IMPLANT
NS IRRIG 1000ML POUR BTL (IV SOLUTION) ×8 IMPLANT
PACK GENERAL/GYN (CUSTOM PROCEDURE TRAY) ×4 IMPLANT
PAD ARMBOARD 7.5X6 YLW CONV (MISCELLANEOUS) ×4 IMPLANT
PENCIL BUTTON HOLSTER BLD 10FT (ELECTRODE) ×4 IMPLANT
SEPRAFILM PROCEDURAL PACK 3X5 (MISCELLANEOUS) ×4 IMPLANT
SPECIMEN JAR LARGE (MISCELLANEOUS) ×4 IMPLANT
SPONGE LAP 18X18 X RAY DECT (DISPOSABLE) ×4 IMPLANT
STAPLER VISISTAT 35W (STAPLE) ×4 IMPLANT
SUCTION POOLE TIP (SUCTIONS) ×4 IMPLANT
SURGILUBE 2OZ TUBE FLIPTOP (MISCELLANEOUS) ×4 IMPLANT
SUT PROLENE 2 0 CT2 30 (SUTURE) IMPLANT
SUT PROLENE 2 0 KS (SUTURE) IMPLANT
SUT SILK 2 0 SH CR/8 (SUTURE) ×4 IMPLANT
SUT SILK 2 0 TIES 10X30 (SUTURE) ×4 IMPLANT
SUT SILK 3 0 SH CR/8 (SUTURE) ×8 IMPLANT
SUT SILK 3 0 TIES 10X30 (SUTURE) ×4 IMPLANT
SUT VIC AB 3-0 SH 18 (SUTURE) IMPLANT
SYR BULB IRRIGATION 50ML (SYRINGE) ×4 IMPLANT
TOWEL OR 17X26 10 PK STRL BLUE (TOWEL DISPOSABLE) ×4 IMPLANT
TRAY FOLEY CATH 14FRSI W/METER (CATHETERS) ×4 IMPLANT
TRAY PROCTOSCOPIC FIBER OPTIC (SET/KITS/TRAYS/PACK) ×4 IMPLANT
TUBE CONNECTING 12'X1/4 (SUCTIONS) ×1
TUBE CONNECTING 12X1/4 (SUCTIONS) ×3 IMPLANT
UNDERPAD 30X30 INCONTINENT (UNDERPADS AND DIAPERS) ×4 IMPLANT
WATER STERILE IRR 1000ML POUR (IV SOLUTION) IMPLANT
YANKAUER SUCT BULB TIP NO VENT (SUCTIONS) ×8 IMPLANT

## 2015-07-28 NOTE — Interval H&P Note (Signed)
History and Physical Interval Note:no change in H and P  07/28/2015 1:23 PM  Margaret Dixon  has presented today for surgery, with the diagnosis of REDUNDANT SIGMOID COLON POLYP  The various methods of treatment have been discussed with the patient and family. After consideration of risks, benefits and other options for treatment, the patient has consented to  Procedure(s): PARTIAL COLECTOMY (N/A) as a surgical intervention .  The patient's history has been reviewed, patient examined, no change in status, stable for surgery.  I have reviewed the patient's chart and labs.  Questions were answered to the patient's satisfaction.     Yael Coppess A

## 2015-07-28 NOTE — Anesthesia Preprocedure Evaluation (Addendum)
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, has seen cardiology and note discussed risk reduction strategies   Neuro/Psych Depression  Neuromuscular disease    GI/Hepatic negative GI ROS, Neg liver ROS,   Endo/Other  negative endocrine ROS  Renal/GU negative Renal ROS     Musculoskeletal  (+) Fibromyalgia -  Abdominal   Peds  Hematology negative hematology ROS (+)   Anesthesia Other Findings   Reproductive/Obstetrics negative OB ROS                          Anesthesia Physical Anesthesia Plan  ASA: II  Anesthesia Plan: General   Post-op Pain Management:    Induction: Intravenous  Airway Management Planned: Oral ETT  Additional Equipment:   Intra-op Plan:   Post-operative Plan:   Informed Consent: I have reviewed the patients History and Physical, chart, labs and discussed the procedure including the risks, benefits and alternatives for the proposed anesthesia with the patient or authorized representative who has indicated his/her understanding and acceptance.   Dental Advisory Given  Plan Discussed with: Anesthesiologist, CRNA and Surgeon  Anesthesia Plan Comments: (Triple anti-emetic therapy, multimodal analgesia Consider lidocaine infusion at 2mg /kg/hr for analgesia)       Anesthesia Quick Evaluation

## 2015-07-28 NOTE — Anesthesia Procedure Notes (Signed)
Procedure Name: Intubation Date/Time: 07/28/2015 1:44 PM Performed by: Garrison Columbus T Pre-anesthesia Checklist: Patient identified, Emergency Drugs available, Suction available and Patient being monitored Patient Re-evaluated:Patient Re-evaluated prior to inductionOxygen Delivery Method: Circle system utilized Preoxygenation: Pre-oxygenation with 100% oxygen Intubation Type: IV induction Ventilation: Mask ventilation without difficulty Laryngoscope Size: Miller and 2 Grade View: Grade I Tube type: Oral Tube size: 7.5 mm Number of attempts: 1 Airway Equipment and Method: Stylet Placement Confirmation: ETT inserted through vocal cords under direct vision,  positive ETCO2 and breath sounds checked- equal and bilateral Secured at: 22 cm Tube secured with: Tape Dental Injury: Teeth and Oropharynx as per pre-operative assessment

## 2015-07-28 NOTE — Transfer of Care (Signed)
Immediate Anesthesia Transfer of Care Note  Patient: Margaret Dixon  Procedure(s) Performed: Procedure(s): EXPLORATORY LAPAROTOMY (N/A) LYSIS OF ADHESION (N/A)  Patient Location: PACU  Anesthesia Type:General  Level of Consciousness: awake, alert  and oriented  Airway & Oxygen Therapy: Patient Spontanous Breathing and Patient connected to nasal cannula oxygen  Post-op Assessment: Report given to RN, Post -op Vital signs reviewed and stable and Patient moving all extremities X 4  Post vital signs: Reviewed and stable  Last Vitals:  Filed Vitals:   07/28/15 1131  BP: 143/76  Pulse: 64  Temp: 36.4 C  Resp: 20    Complications: No apparent anesthesia complications

## 2015-07-28 NOTE — Anesthesia Postprocedure Evaluation (Signed)
Anesthesia Post Note  Patient: Margaret Dixon  Procedure(s) Performed: Procedure(s) (LRB): EXPLORATORY LAPAROTOMY (N/A) LYSIS OF ADHESION (N/A)  Patient location during evaluation: PACU Anesthesia Type: General Level of consciousness: awake and alert Pain management: pain level controlled Vital Signs Assessment: post-procedure vital signs reviewed and stable Respiratory status: spontaneous breathing, nonlabored ventilation, respiratory function stable and patient connected to nasal cannula oxygen Cardiovascular status: blood pressure returned to baseline and stable Postop Assessment: no signs of nausea or vomiting Anesthetic complications: no    Last Vitals:  Filed Vitals:   07/28/15 1545 07/28/15 1600  BP: 125/62 111/94  Pulse: 81 67  Temp: 36.4 C   Resp: 19 21    Last Pain:  Filed Vitals:   07/28/15 1616  PainSc: Asleep                 Zenaida Deed

## 2015-07-28 NOTE — Op Note (Addendum)
EXPLORATORY LAPAROTOMY, LYSIS OF ADHESION  Procedure Note  HAGAR ZANARDI 07/28/2015   Pre-op Diagnosis: REDUNDANT SIGMOID COLON     Post-op Diagnosis: same with large amount intra-abdominal adhesions  Procedure(s): EXPLORATORY LAPAROTOMY LYSIS OF ADHESION COLOPEXY REPAIR OF ENTEROTOMY  Surgeon(s): Coralie Keens, MD  Anesthesia: General  Staff:  Circulator: Harrel Lemon, RN Relief Scrub: Lyndle Herrlich, CST Scrub Person: Rolan Bucco RN First Assistant: Cyd Silence, RN  Estimated Blood Loss: Minimal                         Coralie Keens A   Date: 07/28/2015  Time: 3:34 PM

## 2015-07-29 ENCOUNTER — Encounter (HOSPITAL_COMMUNITY): Payer: Self-pay | Admitting: Surgery

## 2015-07-29 LAB — CBC
HEMATOCRIT: 41 % (ref 36.0–46.0)
Hemoglobin: 13.6 g/dL (ref 12.0–15.0)
MCH: 32.2 pg (ref 26.0–34.0)
MCHC: 33.2 g/dL (ref 30.0–36.0)
MCV: 96.9 fL (ref 78.0–100.0)
PLATELETS: 290 10*3/uL (ref 150–400)
RBC: 4.23 MIL/uL (ref 3.87–5.11)
RDW: 13.3 % (ref 11.5–15.5)
WBC: 12.9 10*3/uL — AB (ref 4.0–10.5)

## 2015-07-29 LAB — BASIC METABOLIC PANEL
ANION GAP: 8 (ref 5–15)
BUN: 14 mg/dL (ref 6–20)
CALCIUM: 8 mg/dL — AB (ref 8.9–10.3)
CO2: 23 mmol/L (ref 22–32)
Chloride: 107 mmol/L (ref 101–111)
Creatinine, Ser: 1 mg/dL (ref 0.44–1.00)
GLUCOSE: 113 mg/dL — AB (ref 65–99)
POTASSIUM: 5.5 mmol/L — AB (ref 3.5–5.1)
Sodium: 138 mmol/L (ref 135–145)

## 2015-07-29 LAB — HEMOGLOBIN A1C
HEMOGLOBIN A1C: 5.7 % — AB (ref 4.8–5.6)
MEAN PLASMA GLUCOSE: 117 mg/dL

## 2015-07-29 MED ORDER — HYDROMORPHONE HCL 1 MG/ML IJ SOLN: 1.0000 mg | INTRAMUSCULAR | Status: DC | PRN

## 2015-07-29 MED ORDER — SODIUM CHLORIDE 0.9 % IV SOLN
INTRAVENOUS | Status: DC
  Administered 2015-07-29 – 2015-08-03 (×11): via INTRAVENOUS

## 2015-07-29 NOTE — Progress Notes (Signed)
   07/29/15 1000  Clinical Encounter Type  Visited With Patient  Visit Type Initial  Referral From Nurse  Spiritual Encounters  Spiritual Needs Prayer;Emotional  Stress Factors  Patient Stress Factors Family relationships;Health changes  Chaplain visited with Pt; Pt said she was pain but still needed prayer; Chaplain stayed for convo with Pt; Pt and chaplain enjoyed the convo; Chaplain prayed with Pt; Chaplain can return upon Pt or nurses request

## 2015-07-29 NOTE — Progress Notes (Signed)
1 Day Post-Op  Subjective: Complains of incisional pain but controlled with PCA  Objective: Vital signs in last 24 hours: Temp:  [97.5 F (36.4 C)-97.9 F (36.6 C)] 97.9 F (36.6 C) (12/02 0635) Pulse Rate:  [62-89] 68 (12/02 0635) Resp:  [13-21] 13 (12/02 0635) BP: (110-144)/(56-94) 144/75 mmHg (12/02 0635) SpO2:  [94 %-100 %] 98 % (12/02 0635) Weight:  [64.411 kg (142 lb)-66.7 kg (147 lb 0.8 oz)] 66.7 kg (147 lb 0.8 oz) (12/01 1805) Last BM Date: 07/28/15  Intake/Output from previous day: 12/01 0701 - 12/02 0700 In: 1000 [I.V.:1000] Out: 590 [Urine:490; Blood:100] Intake/Output this shift: Total I/O In: -  Out: 450 [Urine:450]  Lungs clear Abdomen soft, non distended Dressing dry  Lab Results:   Recent Labs  07/28/15 1222  WBC 8.0  HGB 14.6  HCT 42.9  PLT 242   BMET  Recent Labs  07/28/15 1222  NA 139  K 4.2  CL 106  CO2 24  GLUCOSE 109*  BUN 11  CREATININE 0.99  CALCIUM 9.3   PT/INR No results for input(s): LABPROT, INR in the last 72 hours. ABG No results for input(s): PHART, HCO3 in the last 72 hours.  Invalid input(s): PCO2, PO2  Studies/Results: No results found.  Anti-infectives: Anti-infectives    Start     Dose/Rate Route Frequency Ordered Stop   07/28/15 1245  gentamicin (GARAMYCIN) 320 mg in dextrose 5 % 50 mL IVPB     5 mg/kg  64.4 kg 116 mL/hr over 30 Minutes Intravenous To Surgery 07/28/15 1235 07/28/15 1406   07/28/15 1227  gentamicin (GARAMYCIN) 1.6-0.9 MG/ML-% IVPB  Status:  Discontinued    Comments:  Leandrew Koyanagi   : cabinet override      07/28/15 1227 07/28/15 1238   07/28/15 1226  clindamycin (CLEOCIN) 900 MG/50ML IVPB    Comments:  Leandrew Koyanagi   : cabinet override      07/28/15 1226 07/28/15 1348      Assessment/Plan: s/p Procedure(s): EXPLORATORY LAPAROTOMY (N/A) LYSIS OF ADHESION (N/A)  Check labs D/c foley Keep npo with ice chips Ambulate   LOS: 1 day    Dajia Gunnels A 07/29/2015

## 2015-07-29 NOTE — Progress Notes (Signed)
Patient has reported several times that her PCA is not giving her enough medication to stop the pain. Every time RN goes into room to assess pain, patient is asleep. RN has helped patient reposition several times alleviate pain as well.

## 2015-07-29 NOTE — Op Note (Signed)
NAMEMarland Kitchen  CLOVER, ZULOAGA NO.:  [id number]  MEDICAL RECORD NO.:  [id number]  LOCATION:  6N32C                        FACILITY:  [facility]  PHYSICIAN:  Coralie Keens, M.D. DATE OF BIRTH:  1961-06-02  DATE OF PROCEDURE:  07/28/2015 DATE OF DISCHARGE:                              OPERATIVE REPORT   PREOPERATIVE DIAGNOSIS:  Redundant sigmoid colon.  POSTOPERATIVE DIAGNOSIS:  Redundant sigmoid colon with large amount of intra-abdominal adhesions.  PROCEDURE: 1. Exploratory laparotomy. 2. Lysis of adhesions (1 hour). 3. Repair of enterotomy. 4. Colopexy.  SURGEON:  Coralie Keens, M.D.  ASSISTANT:  Ivin Booty Hinchcock, RNFA.  ANESTHESIA:  General endotracheal anesthesia.  ESTIMATED BLOOD LOSS:  Minimal.  INDICATIONS:  This is a 54 year old female who has had 2 attempted colonoscopies as well as a virtual colonoscopy.  The colonoscopy could not be completed secondary to redundancy of the sigmoid colon.  There was question of a polyp in the descending colon and ascending colon. The decision was made to proceed with laparotomy and possible resection of her redundant sigmoid loop of colon which may be volvulized.  FINDINGS:  The patient was found to have a large amount of small bowel adhesions.  There was small bowel stuck to the vagina and cul-de-sac  to be excised from the cuff.  I created an enterotomy which had to be repaired.  The sigmoid colon itself did not have a very large loop.  It was basically fixed in a mass down in the deep pelvis along the vaginal cuff as well.  I was able to straighten out the sigmoid colon completely.  It did not seem like a large redundant loop so I did a colopexy.  PROCEDURE IN DETAIL:  The patient was brought to the operating room, identified as Margaret Dixon.  She was placed supine on the operating room table and general anesthesia was induced.  She was then placed in a lithotomy position, and a Foley catheter was  inserted.  Her abdomen was then prepped and draped in usual sterile fashion.  I created a midline incision with a scalpel.  I carried this down through the subcutaneous tissue to the fascia which was then opened in the entire length of the incision.  I then opened the peritoneum entirely the incision as well. The patient had small bowel stuck to the abdominal wall.  A small enterotomy was created freeing it up from the abdominal wall which I repaired with interrupted silk sutures.  The patient was found to have a significant amount of adhesions throughout the entire abdomen.  I had to free the omentum up from the abdominal wall with the cautery.  Also I did do 1 hour lysis of adhesions to free up the small bowel.  Several loops were completely adhered to each other and there were 2 loops completely stuck to the vaginal cuff which had to be freed up as well lysing the adhesions.  I then repaired several serosal tears with silk sutures as well.  After this was completed, I was able to run the small bowel from the ligament of Treitz to the cecum.  I found no other injuries and lysed all adhesions.  I then evaluated the ascending  colon and found no polyps that could be easily palpated.  I then likewise evaluated the transverse colon and descending colon.  I then got to the sigmoid colon.  It initially did not appear redundant at all, but then it became apparent that most of the sigmoid colon was fixated in a large lump or mass down in the pelvis from previous hysterectomy.  I did extensively lysed adhesions in order to free up the sigmoid colon which was adherent to itself into the rectum.  Once this was done, I was able to free all the colon up out of the pelvis.  It did not appear particularly redundant when that was completed.  At this point, I elected not to perform a resection.  I instead decided to proceed with colopexy.  I again evaluated the pelvis.  Hemostasis appeared to be achieved  in the pelvis along the vaginal cuff with the cautery and with aid of a  piece of Surgicel.  I then evaluated the left ureter which appeared to be intact while I mobilized the sigmoid colon.  Again, all the dense adhesions in the pelvis would be freed up in order to free the colon.  I then thoroughly irrigated the abdomen with several liters of normal saline.  I used several silk sutures to pexy the sigmoid colon to the abdominal sidewall.  It appeared very straightened from the rectum up and I believe a colonoscopy would now be possible.  I again evaluated the colon and again could feel no masses or polyps in the colon.  Once I irrigated the abdomen further, hemostasis appeared to be achieved.  I then closed the patient's midline fascia with a running #1 looped PDS suture.  I then irrigated the skin and closed it with skin staples. Gauze and tape were then applied.  The patient tolerated the procedure well.  All the counts were correct at the end of procedure.  The patient was then extubated in the operating room and taken in a stable condition to the recovery room.     Coralie Keens, M.D.     DB/MEDQ  D:  07/28/2015  T:  07/29/2015  Job:  [id number]

## 2015-07-29 NOTE — Care Management Note (Signed)
Case Management Note  Patient Details  Name: Margaret Dixon MRN: [id number] Date of Birth: 16-Aug-1961  Subjective/Objective:                    Action/Plan:  Initial UR completed  Expected Discharge Date:                  Expected Discharge Plan:  Home/Self Care  In-House Referral:     Discharge planning Services     Post Acute Care Choice:    Choice offered to:     DME Arranged:    DME Agency:     HH Arranged:    [place] Agency:     Status of Service:  In process, will continue to follow  Medicare Important Message Given:    Date Medicare IM Given:    Medicare IM give by:    Date Additional Medicare IM Given:    Additional Medicare Important Message give by:     If discussed at [place] of Stay Meetings, dates discussed:    Additional Comments:  Marilu Favre, RN 07/29/2015, 7:49 AM

## 2015-07-30 LAB — BASIC METABOLIC PANEL
ANION GAP: 6 (ref 5–15)
BUN: 9 mg/dL (ref 6–20)
CALCIUM: 7.7 mg/dL — AB (ref 8.9–10.3)
CO2: 24 mmol/L (ref 22–32)
Chloride: 107 mmol/L (ref 101–111)
Creatinine, Ser: 0.89 mg/dL (ref 0.44–1.00)
Glucose, Bld: 108 mg/dL — ABNORMAL HIGH (ref 65–99)
POTASSIUM: 3.7 mmol/L (ref 3.5–5.1)
SODIUM: 137 mmol/L (ref 135–145)

## 2015-07-30 MED ORDER — DULOXETINE HCL 30 MG PO CPEP
30.0000 mg | ORAL_CAPSULE | Freq: Every day | ORAL | Status: DC
  Administered 2015-07-30 – 2015-08-04 (×6): 30 mg via ORAL
  Filled 2015-07-30 (×6): qty 1

## 2015-07-30 MED ORDER — ESTRADIOL 1 MG PO TABS
1.0000 mg | ORAL_TABLET | Freq: Every day | ORAL | Status: DC
  Administered 2015-07-30 – 2015-08-04 (×6): 1 mg via ORAL
  Filled 2015-07-30 (×6): qty 1

## 2015-07-30 MED ORDER — LORAZEPAM 0.5 MG PO TABS
0.5000 mg | ORAL_TABLET | Freq: Every evening | ORAL | Status: DC | PRN
  Administered 2015-07-30 – 2015-07-31 (×2): 0.5 mg via ORAL
  Filled 2015-07-30 (×2): qty 1

## 2015-07-30 MED ORDER — OXYCODONE-ACETAMINOPHEN 5-325 MG PO TABS
1.0000 | ORAL_TABLET | ORAL | Status: DC | PRN
  Administered 2015-08-01 – 2015-08-02 (×6): 2 via ORAL
  Filled 2015-07-30 (×6): qty 2

## 2015-07-30 NOTE — Progress Notes (Signed)
Patient ID: Margaret Dixon, female   DOB: 05-25-1961, 54 y.o.   MRN: [id number]     [facility]      [address] [place], [place] [address]    Phone: [phone] FAX: [phone]     Subjective: Sore.  No flatus.  No n/v.  Ambulating. Afebrile.  VSS.  Voiding.   Objective:  Vital signs:  Filed Vitals:   07/30/15 0326 07/30/15 0400 07/30/15 0642 07/30/15 0707  BP: 114/57  109/66   Pulse: 95  92   Temp: 99.4 F (37.4 C)  99.8 F (37.7 C)   TempSrc: Oral  Oral   Resp: $Remo'20 20 18 16  'NNExv$ Height:      Weight:      SpO2: 94% 96% 94% 96%    Last BM Date: 07/28/15 (prior to surgery)  Intake/Output   Yesterday:  12/02 0701 - 12/03 0700 In: 3008.3 [P.O.:250; I.V.:2758.3] Out: 2150 [Urine:2150] This shift:  Total I/O In: -  Out: 200 [Urine:200]   Physical Exam: General: Pt awake/alert/oriented x4 in no acute distress Chest: cta.  No chest wall pain w good excursion CV:  Pulses intact.  Regular rhythm MS: Normal AROM mjr joints.  No obvious deformity Abdomen: Soft.  Nondistended.  Incision with old blood.  Mildly tender at incisions only.  No evidence of peritonitis.  No incarcerated hernias. Ext:  SCDs BLE.  No mjr edema.  No cyanosis Skin: No petechiae / purpura   Problem List:   Active Problems:   S/P exploratory laparotomy    Results:   Labs: Results for orders placed or performed during the hospital encounter of 07/28/15 (from the past 48 hour(s))  Basic metabolic panel     Status: Abnormal   Collection Time: 07/28/15 12:22 PM  Result Value Ref Range   Sodium 139 135 - 145 mmol/L   Potassium 4.2 3.5 - 5.1 mmol/L   Chloride 106 101 - 111 mmol/L   CO2 24 22 - 32 mmol/L   Glucose, Bld 109 (H) 65 - 99 mg/dL   BUN 11 6 - 20 mg/dL   Creatinine, Ser 0.99 0.44 - 1.00 mg/dL   Calcium 9.3 8.9 - 10.3 mg/dL   GFR calc non Af Amer >60 >60 mL/min   GFR calc Af Amer >60 >60 mL/min    Comment: (NOTE) The eGFR has  been calculated using the CKD EPI equation. This calculation has not been validated in all clinical situations. eGFR's persistently <60 mL/min signify possible Chronic Kidney Disease.    Anion gap 9 5 - 15  CBC     Status: None   Collection Time: 07/28/15 12:22 PM  Result Value Ref Range   WBC 8.0 4.0 - 10.5 K/uL   RBC 4.52 3.87 - 5.11 MIL/uL   Hemoglobin 14.6 12.0 - 15.0 g/dL   HCT 42.9 36.0 - 46.0 %   MCV 94.9 78.0 - 100.0 fL   MCH 32.3 26.0 - 34.0 pg   MCHC 34.0 30.0 - 36.0 g/dL   RDW 12.8 11.5 - 15.5 %   Platelets 242 150 - 400 K/uL  Hemoglobin A1c     Status: Abnormal   Collection Time: 07/28/15 12:22 PM  Result Value Ref Range   Hgb A1c MFr Bld 5.7 (H) 4.8 - 5.6 %    Comment: (NOTE)         Pre-diabetes: 5.7 - 6.4         Diabetes: >6.4  Glycemic control for adults with diabetes: <7.0    Mean Plasma Glucose 117 mg/dL    Comment: (NOTE) Performed At: [facility] [place], [place] [id number] Lindon Romp MD GQ:[id number]   Type and screen All Cardiac and thoracic surgeries, spinal fusions, myomectomies, craniotomies, colon & liver resections, total joint revisions, same day c-section with placenta previa or accreta.     Status: None   Collection Time: 07/28/15 12:31 PM  Result Value Ref Range   ABO/RH(D) B NEG    Antibody Screen NEG    Sample Expiration 07/31/2015   ABO/Rh     Status: None   Collection Time: 07/28/15 12:31 PM  Result Value Ref Range   ABO/RH(D) B NEG   CBC     Status: Abnormal   Collection Time: 07/29/15  6:20 AM  Result Value Ref Range   WBC 12.9 (H) 4.0 - 10.5 K/uL   RBC 4.23 3.87 - 5.11 MIL/uL   Hemoglobin 13.6 12.0 - 15.0 g/dL   HCT 41.0 36.0 - 46.0 %   MCV 96.9 78.0 - 100.0 fL   MCH 32.2 26.0 - 34.0 pg   MCHC 33.2 30.0 - 36.0 g/dL   RDW 13.3 11.5 - 15.5 %   Platelets 290 150 - 400 K/uL  Basic metabolic panel     Status: Abnormal   Collection Time: 07/29/15  6:20 AM  Result Value Ref Range   Sodium  138 135 - 145 mmol/L   Potassium 5.5 (H) 3.5 - 5.1 mmol/L    Comment: SPECIMEN HEMOLYZED. HEMOLYSIS MAY AFFECT INTEGRITY OF RESULTS.   Chloride 107 101 - 111 mmol/L   CO2 23 22 - 32 mmol/L   Glucose, Bld 113 (H) 65 - 99 mg/dL   BUN 14 6 - 20 mg/dL   Creatinine, Ser 1.00 0.44 - 1.00 mg/dL   Calcium 8.0 (L) 8.9 - 10.3 mg/dL   GFR calc non Af Amer >60 >60 mL/min   GFR calc Af Amer >60 >60 mL/min    Comment: (NOTE) The eGFR has been calculated using the CKD EPI equation. This calculation has not been validated in all clinical situations. eGFR's persistently <60 mL/min signify possible Chronic Kidney Disease.    Anion gap 8 5 - 15  Basic metabolic panel     Status: Abnormal   Collection Time: 07/30/15  5:50 AM  Result Value Ref Range   Sodium 137 135 - 145 mmol/L   Potassium 3.7 3.5 - 5.1 mmol/L    Comment: DELTA CHECK NOTED   Chloride 107 101 - 111 mmol/L   CO2 24 22 - 32 mmol/L   Glucose, Bld 108 (H) 65 - 99 mg/dL   BUN 9 6 - 20 mg/dL   Creatinine, Ser 0.89 0.44 - 1.00 mg/dL   Calcium 7.7 (L) 8.9 - 10.3 mg/dL   GFR calc non Af Amer >60 >60 mL/min   GFR calc Af Amer >60 >60 mL/min    Comment: (NOTE) The eGFR has been calculated using the CKD EPI equation. This calculation has not been validated in all clinical situations. eGFR's persistently <60 mL/min signify possible Chronic Kidney Disease.    Anion gap 6 5 - 15    Imaging / Studies: No results found.  Medications / Allergies:  Scheduled Meds: . antiseptic oral rinse  7 mL Mouth Rinse q12n4p  . chlorhexidine  15 mL Mouth Rinse BID  . enoxaparin (LOVENOX) injection  40 mg Subcutaneous Q24H  . HYDROmorphone   Intravenous 6  times per day  . pantoprazole (PROTONIX) IV  40 mg Intravenous QHS   Continuous Infusions: . sodium chloride 125 mL/hr at 07/30/15 0711   PRN Meds:.diphenhydrAMINE **OR** diphenhydrAMINE, diphenhydrAMINE **OR** diphenhydrAMINE, HYDROmorphone (DILAUDID) injection, naloxone **AND** sodium chloride,  ondansetron **OR** ondansetron (ZOFRAN) IV, ondansetron (ZOFRAN) IV  Antibiotics: Anti-infectives    Start     Dose/Rate Route Frequency Ordered Stop   07/28/15 1245  gentamicin (GARAMYCIN) 320 mg in dextrose 5 % 50 mL IVPB     5 mg/kg  64.4 kg 116 mL/hr over 30 Minutes Intravenous To Surgery 07/28/15 1235 07/28/15 1406   07/28/15 1227  gentamicin (GARAMYCIN) 1.6-0.9 MG/ML-% IVPB  Status:  Discontinued    Comments:  Leandrew Koyanagi   : cabinet override      07/28/15 1227 07/28/15 1238   07/28/15 1226  clindamycin (CLEOCIN) 900 MG/50ML IVPB    Comments:  Leandrew Koyanagi   : cabinet override      07/28/15 1226 07/28/15 1348        Assessment/Plan Redundant sigmoid colon POD#2 exploratory laparotomy, repair of enterotomy, colopexy, LOA---Dr. Ninfa Linden  -allow for clears, ambulate, IS FEN-clears, IVF, PCA, add PO pain meds.  Resume home meds VTE prophylaxis-SCD/lovenox  Dispo-ileus   Erby Pian, ANP-BC [facility]  07/30/2015 7:42 AM

## 2015-07-31 MED ORDER — PROMETHAZINE HCL 25 MG/ML IJ SOLN
12.5000 mg | Freq: Four times a day (QID) | INTRAMUSCULAR | Status: DC | PRN
  Administered 2015-07-31 – 2015-08-01 (×2): 25 mg via INTRAVENOUS
  Filled 2015-07-31 (×2): qty 1

## 2015-07-31 MED ORDER — HYDROMORPHONE HCL 1 MG/ML IJ SOLN
1.0000 mg | INTRAMUSCULAR | Status: AC | PRN
  Administered 2015-07-31 – 2015-08-01 (×4): 1 mg via INTRAVENOUS
  Filled 2015-07-31 (×4): qty 1

## 2015-07-31 NOTE — Progress Notes (Signed)
Patient ID: Margaret Dixon, female   DOB: [birth year], 54 y.o.   MRN: [id number]     [address] [place], [place] [address]    Phone: [phone] FAX: [phone]     Subjective: Walking, voiding.  No vomiting, but nausea overnight.  Taking in clears.   Objective:  Vital signs:  Filed Vitals:   07/31/15 0011 07/31/15 0132 07/31/15 0348 07/31/15 0548  BP:  118/46  125/67  Pulse:  78  86  Temp:  98.5 F (36.9 C)  98.9 F (37.2 C)  TempSrc:      Resp: _0 Height:      Weight:      SpO2: 94% 94% 97% 95%    Last BM Date: 07/28/15 (prior to surgery)  Intake/Output   Yesterday:  12/03 0701 - 12/04 0700 In: 7703 [P.O.:600; I.V.:932] Out: 2150 [Urine:2150] This shift:    I/O last 3 completed shifts: In: 4035 [P.O.:600; I.V.:3057] Out: 3250 [Urine:3250]     Physical Exam: General: Pt awake/alert/oriented x4 in no acute distress Chest: cta. No chest wall pain w good excursion CV: Pulses intact. Regular rhythm MS: Normal AROM mjr joints. No obvious deformity Abdomen: Soft. distended.  Dressing removed, incision without erythema, serous drainage. Mildly tender at incisions only. No evidence of peritonitis. No incarcerated hernias. Ext: SCDs BLE. No mjr edema. No cyanosis Skin: No petechiae / purpura   Problem List:   Active Problems:   S/P exploratory laparotomy    Results:   Labs: Results for orders placed or performed during the hospital encounter of 07/28/15 (from the past 48 hour(s))  Basic metabolic panel     Status: Abnormal   Collection Time: 07/30/15  5:50 AM  Result Value Ref Range   Sodium 137 135 - 145 mmol/L   Potassium 3.7 3.5 - 5.1 mmol/L    Comment: DELTA CHECK NOTED   Chloride 107 101 - 111 mmol/L   CO2 24 22 - 32 mmol/L   Glucose, Bld 108 (H) 65 - 99 mg/dL   BUN 9 6 - 20 mg/dL   Creatinine, Ser 0.89 0.44 - 1.00 mg/dL   Calcium 7.7 (L) 8.9 - 10.3 mg/dL    GFR calc non Af Amer >60 >60 mL/min   GFR calc Af Amer >60 >60 mL/min    Comment: (NOTE) The eGFR has been calculated using the CKD EPI equation. This calculation has not been validated in all clinical situations. eGFR's persistently <60 mL/min signify possible Chronic Kidney Disease.    Anion gap 6 5 - 15    Imaging / Studies: No results found.  Medications / Allergies:  Scheduled Meds: . DULoxetine  30 mg Oral Daily  . enoxaparin (LOVENOX) injection  40 mg Subcutaneous Q24H  . estradiol  1 mg Oral Daily  . pantoprazole (PROTONIX) IV  40 mg Intravenous QHS   Continuous Infusions: . sodium chloride 125 mL/hr at 07/30/15 2000   PRN Meds:.diphenhydrAMINE **OR** diphenhydrAMINE, HYDROmorphone (DILAUDID) injection, LORazepam, ondansetron **OR** ondansetron (ZOFRAN) IV, oxyCODONE-acetaminophen, promethazine  Antibiotics: Anti-infectives    Start     Dose/Rate Route Frequency Ordered Stop   07/28/15 1245  gentamicin (GARAMYCIN) 320 mg in dextrose 5 % 50 mL IVPB     5 mg/kg  64.4 kg 116 mL/hr over 30 Minutes Intravenous To Surgery 07/28/15 1235 07/28/15 1406   07/28/15 1227  gentamicin (GARAMYCIN) 1.6-0.9 MG/ML-% IVPB  Status:  Discontinued  Comments:  Leandrew Koyanagi   : cabinet override      07/28/15 1227 07/28/15 1238   07/28/15 1226  clindamycin (CLEOCIN) 900 MG/50ML IVPB    Comments:  Leandrew Koyanagi   : cabinet override      07/28/15 1226 07/28/15 1348         Assessment/Plan Redundant sigmoid colon POD#3 exploratory laparotomy, repair of enterotomy, colopexy, LOA---Dr. Ninfa Linden  -clears only, may need NPO and NGT if nausea persists FEN-clears, IVF, DC PCA, add phenergan VTE prophylaxis-SCD/lovenox  Dispo-ileus  Erby Pian, ANP-BC [facility]   07/31/2015 7:35 AM

## 2015-08-01 LAB — BASIC METABOLIC PANEL
ANION GAP: 7 (ref 5–15)
BUN: 5 mg/dL — AB (ref 6–20)
CALCIUM: 7.9 mg/dL — AB (ref 8.9–10.3)
CO2: 27 mmol/L (ref 22–32)
CREATININE: 0.42 mg/dL — AB (ref 0.44–1.00)
Chloride: 107 mmol/L (ref 101–111)
GFR calc Af Amer: >60 mL/min (ref >60)
GLUCOSE: 104 mg/dL — AB (ref 65–99)
Potassium: 3.6 mmol/L (ref 3.5–5.1)
Sodium: 141 mmol/L (ref 135–145)

## 2015-08-01 LAB — CBC
HCT: 30.3 % — ABNORMAL LOW (ref 36.0–46.0)
Hemoglobin: 10.3 g/dL — ABNORMAL LOW (ref 12.0–15.0)
MCH: 32.1 pg (ref 26.0–34.0)
MCHC: 34 g/dL (ref 30.0–36.0)
MCV: 94.4 fL (ref 78.0–100.0)
PLATELETS: 214 10*3/uL (ref 150–400)
RBC: 3.21 MIL/uL — ABNORMAL LOW (ref 3.87–5.11)
RDW: 12.8 % (ref 11.5–15.5)
WBC: 9.9 10*3/uL (ref 4.0–10.5)

## 2015-08-01 MED ORDER — METOCLOPRAMIDE HCL 5 MG/ML IJ SOLN
10.0000 mg | Freq: Three times a day (TID) | INTRAMUSCULAR | Status: AC
  Administered 2015-08-01 – 2015-08-02 (×5): 10 mg via INTRAVENOUS
  Filled 2015-08-01 (×5): qty 2

## 2015-08-01 MED ORDER — CETYLPYRIDINIUM CHLORIDE 0.05 % MT LIQD
7.0000 mL | Freq: Two times a day (BID) | OROMUCOSAL | Status: DC
Start: 2015-08-01 — End: 2015-08-02
  Administered 2015-08-01 (×2): 7 mL via OROMUCOSAL

## 2015-08-01 MED ORDER — NYSTATIN 100000 UNIT/ML MT SUSP
5.0000 mL | Freq: Four times a day (QID) | OROMUCOSAL | Status: DC
  Administered 2015-08-01 – 2015-08-03 (×11): 500000 [IU] via ORAL
  Filled 2015-08-01 (×10): qty 5

## 2015-08-01 MED ORDER — CHLORHEXIDINE GLUCONATE 0.12 % MT SOLN
15.0000 mL | Freq: Two times a day (BID) | OROMUCOSAL | Status: DC
  Administered 2015-08-01 – 2015-08-02 (×3): 15 mL via OROMUCOSAL
  Filled 2015-08-01 (×3): qty 15

## 2015-08-01 NOTE — Progress Notes (Signed)
Patient has vomited several times during night. Zofran and phenergan given. Patient stated she did feel better after vomiting. Will continue to monitor. Jimmie Molly, RN

## 2015-08-01 NOTE — Progress Notes (Signed)
4 Days Post-Op  Subjective: Nausea and Dixon small amount of emesis last night Feels better this morning Denies flatus  Objective: Vital signs in last 24 hours: Temp:  [98.1 F (36.7 C)-99.6 F (37.6 C)] 98.8 F (37.1 C) (12/05 0531) Pulse Rate:  [70-80] 75 (12/05 0531) Resp:  [16-21] 17 (12/05 0531) BP: (120-128)/(70-82) 128/76 mmHg (12/05 0531) SpO2:  [92 %-95 %] 92 % (12/05 0531) Last BM Date: 07/28/15 (prior to surgery)  Intake/Output from previous day: 12/04 0701 - 12/05 0700 In: 5587.1 [P.O.:660; I.V.:4927.1] Out: 2900 [Urine:2800; Emesis/NG output:100] Intake/Output this shift:    Lungs clear Abdomen soft, incision clean, rare BS  Lab Results:   Recent Labs  08/01/15 0352  WBC 9.9  HGB 10.3*  HCT 30.3*  PLT 214   BMET  Recent Labs  07/30/15 0550 08/01/15 0352  NA 137 141  K 3.7 3.6  CL 107 107  CO2 24 27  GLUCOSE 108* 104*  BUN 9 5*  CREATININE 0.89 0.42*  CALCIUM 7.7* 7.9*   PT/INR No results for input(s): LABPROT, INR in the last 72 hours. ABG No results for input(s): PHART, HCO3 in the last 72 hours.  Invalid input(s): PCO2, PO2  Studies/Results: No results found.  Anti-infectives: Anti-infectives    Start     Dose/Rate Route Frequency Ordered Stop   07/28/15 1245  gentamicin (GARAMYCIN) 320 mg in dextrose 5 % 50 mL IVPB     5 mg/kg  64.4 kg 116 mL/hr over 30 Minutes Intravenous To Surgery 07/28/15 1235 07/28/15 1406   07/28/15 1227  gentamicin (GARAMYCIN) 1.6-0.9 MG/ML-% IVPB  Status:  Discontinued    Comments:  Leandrew Koyanagi   : cabinet override      07/28/15 1227 07/28/15 1238   07/28/15 1226  clindamycin (CLEOCIN) 900 MG/50ML IVPB    Comments:  Leandrew Koyanagi   : cabinet override      07/28/15 1226 07/28/15 1348      Assessment/Plan: s/p Procedure(s): EXPLORATORY LAPAROTOMY (N/Dixon) LYSIS OF ADHESION (N/Dixon)   Ileus  Keep on liquids reglan Ambulate Decrease IVF  LOS: 4 days    Margaret Dixon 08/01/2015

## 2015-08-02 MED ORDER — TRAMADOL HCL 50 MG PO TABS
50.0000 mg | ORAL_TABLET | Freq: Four times a day (QID) | ORAL | Status: DC | PRN
  Administered 2015-08-02 – 2015-08-04 (×6): 100 mg via ORAL
  Filled 2015-08-02 (×3): qty 2
  Filled 2015-08-02: qty 1
  Filled 2015-08-02 (×2): qty 2
  Filled 2015-08-02: qty 1

## 2015-08-02 NOTE — Progress Notes (Signed)
5 Days Post-Op  Subjective: Had 2 BM's this morning  Objective: Vital signs in last 24 hours: Temp:  [98.1 F (36.7 C)-99 F (37.2 C)] 98.1 F (36.7 C) (12/06 0603) Pulse Rate:  [73-81] 76 (12/06 0603) Resp:  [16] 16 (12/06 0603) BP: (134-147)/(69-71) 147/69 mmHg (12/06 0603) SpO2:  [92 %-95 %] 92 % (12/06 0603) Last BM Date: 08/01/15  Intake/Output from previous day: 12/05 0701 - 12/06 0700 In: 2956.2 [P.O.:1050; I.V.:1906.2] Out: 1100 [Urine:1100] Intake/Output this shift:    Abdomen soft, incision clean  Lab Results:   Recent Labs  08/01/15 0352  WBC 9.9  HGB 10.3*  HCT 30.3*  PLT 214   BMET  Recent Labs  08/01/15 0352  NA 141  K 3.6  CL 107  CO2 27  GLUCOSE 104*  BUN 5*  CREATININE 0.42*  CALCIUM 7.9*   PT/INR No results for input(s): LABPROT, INR in the last 72 hours. ABG No results for input(s): PHART, HCO3 in the last 72 hours.  Invalid input(s): PCO2, PO2  Studies/Results: No results found.  Anti-infectives: Anti-infectives    Start     Dose/Rate Route Frequency Ordered Stop   07/28/15 1245  gentamicin (GARAMYCIN) 320 mg in dextrose 5 % 50 mL IVPB     5 mg/kg  64.4 kg 116 mL/hr over 30 Minutes Intravenous To Surgery 07/28/15 1235 07/28/15 1406   07/28/15 1227  gentamicin (GARAMYCIN) 1.6-0.9 MG/ML-% IVPB  Status:  Discontinued    Comments:  Leandrew Koyanagi   : cabinet override      07/28/15 1227 07/28/15 1238   07/28/15 1226  clindamycin (CLEOCIN) 900 MG/50ML IVPB    Comments:  Leandrew Koyanagi   : cabinet override      07/28/15 1226 07/28/15 1348      Assessment/Plan: s/p Procedure(s): EXPLORATORY LAPAROTOMY (N/A) LYSIS OF ADHESION (N/A)  Ileus improved  Try full liquid diet Decrease IVF  LOS: 5 days    Jessy Calixte A 08/02/2015

## 2015-08-03 LAB — CBC WITH DIFFERENTIAL/PLATELET
BASOS ABS: 0 10*3/uL (ref 0.0–0.1)
BASOS PCT: 0 %
EOS ABS: 0.2 10*3/uL (ref 0.0–0.7)
EOS PCT: 2 %
HCT: 32.9 % — ABNORMAL LOW (ref 36.0–46.0)
Hemoglobin: 11.4 g/dL — ABNORMAL LOW (ref 12.0–15.0)
LYMPHS PCT: 16 %
Lymphs Abs: 1.3 10*3/uL (ref 0.7–4.0)
MCH: 31.8 pg (ref 26.0–34.0)
MCHC: 34.7 g/dL (ref 30.0–36.0)
MCV: 91.9 fL (ref 78.0–100.0)
MONO ABS: 1 10*3/uL (ref 0.1–1.0)
Monocytes Relative: 13 %
Neutro Abs: 5.3 10*3/uL (ref 1.7–7.7)
Neutrophils Relative %: 69 %
PLATELETS: 270 10*3/uL (ref 150–400)
RBC: 3.58 MIL/uL — ABNORMAL LOW (ref 3.87–5.11)
RDW: 12.7 % (ref 11.5–15.5)
WBC: 7.7 10*3/uL (ref 4.0–10.5)

## 2015-08-03 LAB — BASIC METABOLIC PANEL
Anion gap: 10 (ref 5–15)
CALCIUM: 8.5 mg/dL — AB (ref 8.9–10.3)
CO2: 29 mmol/L (ref 22–32)
CREATININE: 0.75 mg/dL (ref 0.44–1.00)
Chloride: 96 mmol/L — ABNORMAL LOW (ref 101–111)
GFR calc Af Amer: >60 mL/min (ref >60)
GLUCOSE: 100 mg/dL — AB (ref 65–99)
Potassium: 2.8 mmol/L — ABNORMAL LOW (ref 3.5–5.1)
SODIUM: 135 mmol/L (ref 135–145)

## 2015-08-03 MED ORDER — POTASSIUM CHLORIDE 10 MEQ/100ML IV SOLN
10.0000 meq | INTRAVENOUS | Status: AC
  Administered 2015-08-03 (×5): 10 meq via INTRAVENOUS
  Filled 2015-08-03 (×4): qty 100

## 2015-08-03 NOTE — Progress Notes (Signed)
6 Days Post-Op  Subjective: Had nausea and emesis after pain meds last night Feels better Continues to have BM's  Objective: Vital signs in last 24 hours: Temp:  [98.7 F (37.1 C)-99.4 F (37.4 C)] 99.4 F (37.4 C) (12/07 0539) Pulse Rate:  [70-77] 70 (12/07 0539) Resp:  [16] 16 (12/07 0539) BP: (136-147)/(66-77) 136/77 mmHg (12/07 0539) SpO2:  [93 %-96 %] 96 % (12/07 0539) Last BM Date: 08/02/15  Intake/Output from previous day: 12/06 0701 - 12/07 0700 In: 1858.3 [P.O.:1200; I.V.:658.3] Out: 1000 [Urine:1000] Intake/Output this shift: Total I/O In: 240 [P.O.:240] Out: 250 [Urine:250]  Looks comfortable Abdomen soft, non-distended, incision clean Minimally tender  Lab Results:   Recent Labs  08/01/15 0352  WBC 9.9  HGB 10.3*  HCT 30.3*  PLT 214   BMET  Recent Labs  08/01/15 0352  NA 141  K 3.6  CL 107  CO2 27  GLUCOSE 104*  BUN 5*  CREATININE 0.42*  CALCIUM 7.9*   PT/INR No results for input(s): LABPROT, INR in the last 72 hours. ABG No results for input(s): PHART, HCO3 in the last 72 hours.  Invalid input(s): PCO2, PO2  Studies/Results: No results found.  Anti-infectives: Anti-infectives    Start     Dose/Rate Route Frequency Ordered Stop   07/28/15 1245  gentamicin (GARAMYCIN) 320 mg in dextrose 5 % 50 mL IVPB     5 mg/kg  64.4 kg 116 mL/hr over 30 Minutes Intravenous To Surgery 07/28/15 1235 07/28/15 1406   07/28/15 1227  gentamicin (GARAMYCIN) 1.6-0.9 MG/ML-% IVPB  Status:  Discontinued    Comments:  Leandrew Koyanagi   : cabinet override      07/28/15 1227 07/28/15 1238   07/28/15 1226  clindamycin (CLEOCIN) 900 MG/50ML IVPB    Comments:  Leandrew Koyanagi   : cabinet override      07/28/15 1226 07/28/15 1348      Assessment/Plan: s/p Procedure(s): EXPLORATORY LAPAROTOMY (N/A) LYSIS OF ADHESION (N/A)  Continue IVF and full liquids Check labs Suspect N/V are med related  LOS: 6 days    Margaret Dixon A 08/03/2015

## 2015-08-04 MED ORDER — CYANOCOBALAMIN 1000 MCG/ML IJ SOLN
1000.0000 ug | Freq: Once | INTRAMUSCULAR | Status: AC
  Administered 2015-08-04: 1000 ug via INTRAMUSCULAR
  Filled 2015-08-04: qty 1

## 2015-08-04 MED ORDER — TRAMADOL HCL 50 MG PO TABS: 50.0000 mg | ORAL_TABLET | Freq: Four times a day (QID) | ORAL | Status: DC | PRN

## 2015-08-04 NOTE — Discharge Summary (Signed)
Physician Discharge Summary  Patient ID: Margaret Dixon MRN: [id number] DOB/AGE: Nov 04, 1960 54 y.o.  Admit date: 07/28/2015 Discharge date: 08/04/2015  Admission Diagnoses:  Discharge Diagnoses:  Active Problems:   S/P exploratory laparotomy abdominal pain from adhesions  Discharged Condition: good  Hospital Course: uneventful post op recovery.  Admitted to the floor post op.  Started on liquids on POD#2.  Slowly advanced diet.  Starting having BM's by POD#5.  Tolerating a diet so discharged home  Consults: None  Significant Diagnostic Studies:   Treatments: surgery: exploratory laparotomy, lysis of adhesions, colopexy  Discharge Exam: Blood pressure 146/73, pulse 89, temperature 98.6 F (37 C), temperature source Oral, resp. rate 18, height 5\' 4"  (1.626 m), weight 66.7 kg (147 lb 0.8 oz), SpO2 99 %. General appearance: alert, cooperative and no distress Resp: clear to auscultation bilaterally Cardio: regular rate and rhythm, S1, S2 normal, no murmur, click, rub or gallop Incision/Wound:abdomen soft, non tender, incision healing well  Disposition: 01-Home or Self Care     Medication List    TAKE these medications        benzonatate 200 MG capsule  Commonly known as:  TESSALON  Take 200 mg by mouth 3 (three) times daily as needed. cough     cyanocobalamin 1000 MCG/ML injection  Commonly known as:  (VITAMIN B-12)  Inject 1,000 mcg into the muscle every 14 (fourteen) days.     DULoxetine 30 MG capsule  Commonly known as:  CYMBALTA  Take 30 mg by mouth daily.     ergocalciferol 50000 UNITS capsule  Commonly known as:  VITAMIN D2  Take 50,000 Units by mouth once a week.     estradiol 1 MG tablet  Commonly known as:  ESTRACE  Take 1 mg by mouth daily.     HYDROcodone-acetaminophen 5-500 MG tablet  Commonly known as:  VICODIN  Take 1 tablet by mouth every 6 (six) hours as needed. For pain associated with fibromyalgia     LORazepam 0.5 MG tablet  Commonly  known as:  ATIVAN  Take 0.5 mg by mouth at bedtime as needed. For muscle relaxant associated with fibromyalgia     traMADol 50 MG tablet  Commonly known as:  ULTRAM  Take 1-2 tablets (50-100 mg total) by mouth every 6 (six) hours as needed for moderate pain.           Follow-up Information    Follow up with [facility] A, MD. Schedule an appointment as soon as possible for a visit in 3 weeks.   Specialty:  General Surgery   Contact information:   [address] [place] [address] [phone]       Signed: Harl Bowie 08/04/2015, 7:38 AM

## 2015-08-04 NOTE — Discharge Instructions (Signed)
[facility], [place] [phone]  OPEN ABDOMINAL SURGERY: POST OP INSTRUCTIONS  Always review your discharge instruction sheet given to you by the facility where your surgery was performed.  IF YOU HAVE DISABILITY OR FAMILY LEAVE FORMS, YOU MUST BRING THEM TO THE OFFICE FOR PROCESSING.  PLEASE DO NOT GIVE THEM TO YOUR DOCTOR.  1. A prescription for pain medication may be given to you upon discharge.  Take your pain medication as prescribed, if needed.  If narcotic pain medicine is not needed, then you may take acetaminophen (Tylenol) or ibuprofen (Advil) as needed. 2. Take your usually prescribed medications unless otherwise directed. 3. If you need a refill on your pain medication, please contact your pharmacy. They will contact our office to request authorization.  Prescriptions will not be filled after 5pm or on week-ends. 4. You should follow a light diet the first few days after arrival home, such as soup and crackers, pudding, etc.unless your doctor has advised otherwise. A high-fiber, low fat diet can be resumed as tolerated.   Be sure to include lots of fluids daily. Most patients will experience some swelling and bruising on the chest and neck area.  Ice packs will help.  Swelling and bruising can take several days to resolve 5. Most patients will experience some swelling and bruising in the area of the incision. Ice pack will help. Swelling and bruising can take several days to resolve..  6. It is common to experience some constipation if taking pain medication after surgery.  Increasing fluid intake and taking a stool softener will usually help or prevent this problem from occurring.  A mild laxative (Milk of Magnesia or Miralax) should be taken according to package directions if there are no bowel movements after 48 hours. 7.  You may have steri-strips (small skin tapes) in place directly over the incision.  These strips should be left on the skin for 7-10 days.  If your  surgeon used skin glue on the incision, you may shower in 24 hours.  The glue will flake off over the next 2-3 weeks.  Any sutures or staples will be removed at the office during your follow-up visit. You may find that a light gauze bandage over your incision may keep your staples from being rubbed or pulled. You may shower and replace the bandage daily. 8. ACTIVITIES:  You may resume regular (light) daily activities beginning the next day--such as daily self-care, walking, climbing stairs--gradually increasing activities as tolerated.  You may have sexual intercourse when it is comfortable.  Refrain from any heavy lifting or straining until approved by your doctor. (5 weeks of no lifting) a. You may drive when you no longer are taking prescription pain medication, you can comfortably wear a seatbelt, and you can safely maneuver your car and apply brakes b. Return to Work: ___________________________________ 66. You should see your doctor in the office for a follow-up appointment approximately two weeks after your surgery.  Make sure that you call for this appointment within a day or two after you arrive home to insure a convenient appointment time. OTHER INSTRUCTIONS:  _____________________________________________________________ _____________________________________________________________  WHEN TO CALL YOUR DOCTOR: 1. Fever over 101.0 2. Inability to urinate 3. Nausea and/or vomiting 4. Extreme swelling or bruising 5. Continued bleeding from incision. 6. Increased pain, redness, or drainage from the incision. 7. Difficulty swallowing or breathing 8. Muscle cramping or spasms. 9. Numbness or tingling in hands or feet or around lips.  The  clinic staff is available to answer your questions during regular business hours.  Please dont hesitate to call and ask to speak to one of the nurses if you have concerns.  For further questions, please visit [email or web address]

## 2015-08-04 NOTE — Progress Notes (Signed)
7 Days Post-Op  Subjective: Doing well No nausea Tolerating Dixon regular diet and having BM's  Objective: Vital signs in last 24 hours: Temp:  [98.1 F (36.7 C)-99.1 F (37.3 C)] 98.6 F (37 C) (12/08 0500) Pulse Rate:  [64-89] 89 (12/08 0500) Resp:  [18] 18 (12/08 0500) BP: (134-146)/(73-77) 146/73 mmHg (12/08 0500) SpO2:  [96 %-99 %] 99 % (12/08 0500) Last BM Date: 08/03/15  Intake/Output from previous day: 12/07 0701 - 12/08 0700 In: 2938.3 [P.O.:1350; I.V.:1088.3; IV Piggyback:500] Out: 1625 [Urine:1625] Intake/Output this shift:    Abdomen soft, incision healing well  Lab Results:   Recent Labs  08/03/15 0645  WBC 7.7  HGB 11.4*  HCT 32.9*  PLT 270   BMET  Recent Labs  08/03/15 0645  NA 135  K 2.8*  CL 96*  CO2 29  GLUCOSE 100*  BUN <5*  CREATININE 0.75  CALCIUM 8.5*   PT/INR No results for input(s): LABPROT, INR in the last 72 hours. ABG No results for input(s): PHART, HCO3 in the last 72 hours.  Invalid input(s): PCO2, PO2  Studies/Results: No results found.  Anti-infectives: Anti-infectives    Start     Dose/Rate Route Frequency Ordered Stop   07/28/15 1245  gentamicin (GARAMYCIN) 320 mg in dextrose 5 % 50 mL IVPB     5 mg/kg  64.4 kg 116 mL/hr over 30 Minutes Intravenous To Surgery 07/28/15 1235 07/28/15 1406   07/28/15 1227  gentamicin (GARAMYCIN) 1.6-0.9 MG/ML-% IVPB  Status:  Discontinued    Comments:  Leandrew Koyanagi   : cabinet override      07/28/15 1227 07/28/15 1238   07/28/15 1226  clindamycin (CLEOCIN) 900 MG/50ML IVPB    Comments:  Leandrew Koyanagi   : cabinet override      07/28/15 1226 07/28/15 1348      Assessment/Plan: s/p Procedure(s): EXPLORATORY LAPAROTOMY (N/Dixon) LYSIS OF ADHESION (N/Dixon)   Discharge home  LOS: 7 days    Margaret Dixon 08/04/2015

## 2015-08-04 NOTE — Progress Notes (Signed)
Per order, staples removed from midline incision. 1/2 inch steri strips placed.

## 2015-09-26 DIAGNOSIS — E559 Vitamin D deficiency, unspecified: Secondary | ICD-10-CM | POA: Insufficient documentation

## 2015-10-20 ENCOUNTER — Other Ambulatory Visit: Payer: Self-pay | Admitting: Surgery

## 2015-10-27 ENCOUNTER — Encounter (HOSPITAL_BASED_OUTPATIENT_CLINIC_OR_DEPARTMENT_OTHER): Payer: Self-pay | Admitting: *Deleted

## 2015-11-02 NOTE — H&P (Signed)
Margaret Dixon is an 55 y.o. female.   Chief Complaint: chronic stitch abscess HPI: she is status post exploratory laparotomy in December 2016.  She has continued to have recurrent superficial infections in her midline incision.  She has failed conservative management decision has been made to proceed to the operating room for removal of sutures as the source of her infection.  She is otherwise without complaints.  Past Medical History  Diagnosis Date  . Fibromyalgia   . Hormone replacement therapy, postmenopausal   . Depression     due to Fibromyalgia  . Constipation   . Diarrhea   . PONV (postoperative nausea and vomiting)     Past Surgical History  Procedure Laterality Date  . Vaginal hysterectomy    . Cholecystectomy    . Appendectomy    . Tonsillectomy    . Colonoscopy    . Abdominal adhesion surgery  07/28/2015  . Laparotomy N/A 07/28/2015    Procedure: EXPLORATORY LAPAROTOMY;  Surgeon: Coralie Keens, MD;  Location: [facility];  Service: General;  Laterality: N/A;  . Lysis of adhesion N/A 07/28/2015    Procedure: LYSIS OF ADHESION;  Surgeon: Coralie Keens, MD;  Location: [facility];  Service: General;  Laterality: N/A;    Family History  Problem Relation Age of Onset  . Coronary artery disease Father 73  . Diabetes Father   . Colon polyps Father   . Emphysema Father   . Coronary artery disease Paternal Grandfather     Died of CAD at an early age.  . Cancer Mother    Social History:  reports that she quit smoking about 15 months ago. Her smoking use included Cigarettes. She has a 2.4 pack-year smoking history. She has never used smokeless tobacco. She reports that she drinks alcohol. She reports that she does not use illicit drugs.  Allergies:  Allergies  Allergen Reactions  . Penicillins Hives and Rash    Has patient had a PCN reaction causing immediate rash, facial/tongue/throat swelling, SOB or lightheadedness with hypotension: Yes Has patient had a PCN reaction  causing severe rash involving mucus membranes or skin necrosis: No Has patient had a PCN reaction that required hospitalization No Has patient had a PCN reaction occurring within the last 10 years: No If all of the above answers are "NO", then may proceed with Cephalosporin use.   . Cephalexin   . Clarithromycin   . Levofloxacin     No prescriptions prior to admission    No results found for this or any previous visit (from the past 48 hour(s)). No results found.  Review of Systems  All other systems reviewed and are negative.   Height 5\' 4"  (1.626 m), weight 63.504 kg (140 lb). Physical Exam  Constitutional: She appears well-developed and well-nourished. No distress.  HENT:  Head: Normocephalic and atraumatic.  Eyes: Pupils are equal, round, and reactive to light.  Neck: Normal range of motion. No tracheal deviation present.  Cardiovascular: Normal rate and regular rhythm.   Respiratory: Effort normal and breath sounds normal. No respiratory distress.  GI: Soft. She exhibits no distension. There is no tenderness.  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  I discussed the risks which includes but is not limited to bleeding, infection, having a  chronic open wound, need for further surgery, etc. She understands and wishes to proceed with surgery  Monnie Gudgel A, MD 11/02/2015, 9:14 AM

## 2015-11-03 ENCOUNTER — Ambulatory Visit (HOSPITAL_BASED_OUTPATIENT_CLINIC_OR_DEPARTMENT_OTHER): Payer: BLUE CROSS/BLUE SHIELD | Admitting: Certified Registered"

## 2015-11-03 ENCOUNTER — Encounter (HOSPITAL_BASED_OUTPATIENT_CLINIC_OR_DEPARTMENT_OTHER)
Admission: RE | Disposition: A | Discharge status: Home / Self Care | Payer: Self-pay | Source: Ambulatory Visit | Attending: Surgery

## 2015-11-03 ENCOUNTER — Ambulatory Visit (HOSPITAL_BASED_OUTPATIENT_CLINIC_OR_DEPARTMENT_OTHER)
Admission: RE | Admit: 2015-11-03 | Discharge: 2015-11-03 | Disposition: A | Discharge status: Home / Self Care | Payer: BLUE CROSS/BLUE SHIELD | Source: Ambulatory Visit | Attending: Surgery | Admitting: Surgery

## 2015-11-03 ENCOUNTER — Encounter (HOSPITAL_BASED_OUTPATIENT_CLINIC_OR_DEPARTMENT_OTHER): Payer: Self-pay | Admitting: Certified Registered"

## 2015-11-03 DIAGNOSIS — L02211 Cutaneous abscess of abdominal wall: Secondary | ICD-10-CM | POA: Diagnosis not present

## 2015-11-03 DIAGNOSIS — F329 Major depressive disorder, single episode, unspecified: Secondary | ICD-10-CM | POA: Diagnosis not present

## 2015-11-03 DIAGNOSIS — Z87891 Personal history of nicotine dependence: Secondary | ICD-10-CM | POA: Diagnosis not present

## 2015-11-03 DIAGNOSIS — I2510 Atherosclerotic heart disease of native coronary artery without angina pectoris: Secondary | ICD-10-CM | POA: Diagnosis not present

## 2015-11-03 DIAGNOSIS — M797 Fibromyalgia: Secondary | ICD-10-CM | POA: Insufficient documentation

## 2015-11-03 DIAGNOSIS — T814XXA Infection following a procedure, initial encounter: Secondary | ICD-10-CM | POA: Insufficient documentation

## 2015-11-03 DIAGNOSIS — Z880 Allergy status to penicillin: Secondary | ICD-10-CM | POA: Insufficient documentation

## 2015-11-03 DIAGNOSIS — Z881 Allergy status to other antibiotic agents status: Secondary | ICD-10-CM | POA: Insufficient documentation

## 2015-11-03 DIAGNOSIS — Z90710 Acquired absence of both cervix and uterus: Secondary | ICD-10-CM | POA: Diagnosis not present

## 2015-11-03 HISTORY — PX: INCISION AND DRAINAGE ABSCESS: SHX5864

## 2015-11-03 SURGERY — INCISION AND DRAINAGE, ABSCESS
Anesthesia: General | Site: Abdomen

## 2015-11-03 MED ORDER — LIDOCAINE HCL (CARDIAC) 20 MG/ML IV SOLN
INTRAVENOUS | Status: AC
  Filled 2015-11-03: qty 5

## 2015-11-03 MED ORDER — CLINDAMYCIN HCL 300 MG PO CAPS: 300.0000 mg | ORAL_CAPSULE | Freq: Three times a day (TID) | ORAL | Status: DC

## 2015-11-03 MED ORDER — GLYCOPYRROLATE 0.2 MG/ML IJ SOLN: 0.2000 mg | Freq: Once | INTRAMUSCULAR | Status: DC | PRN

## 2015-11-03 MED ORDER — FENTANYL CITRATE (PF) 100 MCG/2ML IJ SOLN
INTRAMUSCULAR | Status: DC | PRN
  Administered 2015-11-03: 100 ug via INTRAVENOUS

## 2015-11-03 MED ORDER — FENTANYL CITRATE (PF) 100 MCG/2ML IJ SOLN
25.0000 ug | INTRAMUSCULAR | Status: DC | PRN
  Administered 2015-11-03: 25 ug via INTRAVENOUS
  Administered 2015-11-03: 50 ug via INTRAVENOUS
  Administered 2015-11-03: 25 ug via INTRAVENOUS

## 2015-11-03 MED ORDER — SCOPOLAMINE 1 MG/3DAYS TD PT72: 1.0000 | MEDICATED_PATCH | Freq: Once | TRANSDERMAL | Status: DC | PRN

## 2015-11-03 MED ORDER — MIDAZOLAM HCL 5 MG/5ML IJ SOLN
INTRAMUSCULAR | Status: DC | PRN
  Administered 2015-11-03: 2 mg via INTRAVENOUS

## 2015-11-03 MED ORDER — LIDOCAINE HCL (CARDIAC) 20 MG/ML IV SOLN
INTRAVENOUS | Status: DC | PRN
  Administered 2015-11-03: 50 mg via INTRAVENOUS

## 2015-11-03 MED ORDER — DEXAMETHASONE SODIUM PHOSPHATE 4 MG/ML IJ SOLN
INTRAMUSCULAR | Status: DC | PRN
  Administered 2015-11-03: 10 mg via INTRAVENOUS

## 2015-11-03 MED ORDER — FENTANYL CITRATE (PF) 100 MCG/2ML IJ SOLN: 50.0000 ug | INTRAMUSCULAR | Status: DC | PRN

## 2015-11-03 MED ORDER — BUPIVACAINE-EPINEPHRINE 0.5% -1:200000 IJ SOLN
INTRAMUSCULAR | Status: DC | PRN
  Administered 2015-11-03 (×2): 10 mL

## 2015-11-03 MED ORDER — PROMETHAZINE HCL 25 MG/ML IJ SOLN: 6.2500 mg | INTRAMUSCULAR | Status: DC | PRN

## 2015-11-03 MED ORDER — ONDANSETRON HCL 4 MG/2ML IJ SOLN
INTRAMUSCULAR | Status: AC
  Filled 2015-11-03: qty 2

## 2015-11-03 MED ORDER — MIDAZOLAM HCL 2 MG/2ML IJ SOLN: 1.0000 mg | INTRAMUSCULAR | Status: DC | PRN

## 2015-11-03 MED ORDER — MIDAZOLAM HCL 2 MG/2ML IJ SOLN
INTRAMUSCULAR | Status: AC
  Filled 2015-11-03: qty 2

## 2015-11-03 MED ORDER — VANCOMYCIN HCL IN DEXTROSE 1-5 GM/200ML-% IV SOLN
1000.0000 mg | INTRAVENOUS | Status: AC
  Administered 2015-11-03: 1000 mg via INTRAVENOUS

## 2015-11-03 MED ORDER — DEXAMETHASONE SODIUM PHOSPHATE 10 MG/ML IJ SOLN
INTRAMUSCULAR | Status: AC
  Filled 2015-11-03: qty 1

## 2015-11-03 MED ORDER — VANCOMYCIN HCL IN DEXTROSE 1-5 GM/200ML-% IV SOLN
INTRAVENOUS | Status: AC
  Filled 2015-11-03: qty 200

## 2015-11-03 MED ORDER — LACTATED RINGERS IV SOLN
INTRAVENOUS | Status: DC
  Administered 2015-11-03: 13:00:00 via INTRAVENOUS

## 2015-11-03 MED ORDER — HYDROCODONE-ACETAMINOPHEN 5-500 MG PO TABS: 1.0000 | ORAL_TABLET | ORAL | Status: DC | PRN

## 2015-11-03 MED ORDER — MEPERIDINE HCL 25 MG/ML IJ SOLN: 6.2500 mg | INTRAMUSCULAR | Status: DC | PRN

## 2015-11-03 MED ORDER — KETOROLAC TROMETHAMINE 30 MG/ML IJ SOLN
INTRAMUSCULAR | Status: AC
  Filled 2015-11-03: qty 1

## 2015-11-03 MED ORDER — PROPOFOL 10 MG/ML IV BOLUS
INTRAVENOUS | Status: DC | PRN
  Administered 2015-11-03: 200 mg via INTRAVENOUS

## 2015-11-03 MED ORDER — FENTANYL CITRATE (PF) 100 MCG/2ML IJ SOLN
INTRAMUSCULAR | Status: AC
  Filled 2015-11-03: qty 2

## 2015-11-03 MED ORDER — PROPOFOL 10 MG/ML IV BOLUS
INTRAVENOUS | Status: AC
  Filled 2015-11-03: qty 40

## 2015-11-03 MED ORDER — LACTATED RINGERS IV SOLN: INTRAVENOUS | Status: DC

## 2015-11-03 MED ORDER — FENTANYL CITRATE (PF) 100 MCG/2ML IJ SOLN
INTRAMUSCULAR | Status: AC
Start: 2015-11-03 — End: 2015-11-03
  Filled 2015-11-03: qty 2

## 2015-11-03 SURGICAL SUPPLY — 41 items
BLADE HEX COATED 2.75 (ELECTRODE) ×3 IMPLANT
BLADE SURG 15 STRL LF DISP TIS (BLADE) ×1 IMPLANT
BLADE SURG 15 STRL SS (BLADE) ×2
CANISTER SUCT 1200ML W/VALVE (MISCELLANEOUS) IMPLANT
CHLORAPREP W/TINT 26ML (MISCELLANEOUS) ×3 IMPLANT
COVER BACK TABLE 60X90IN (DRAPES) ×3 IMPLANT
COVER MAYO STAND STRL (DRAPES) ×3 IMPLANT
DECANTER SPIKE VIAL GLASS SM (MISCELLANEOUS) IMPLANT
DRAPE LAPAROTOMY 100X72 PEDS (DRAPES) ×3 IMPLANT
DRAPE UTILITY XL STRL (DRAPES) ×3 IMPLANT
ELECT REM PT RETURN 9FT ADLT (ELECTROSURGICAL) ×3
ELECTRODE REM PT RTRN 9FT ADLT (ELECTROSURGICAL) ×1 IMPLANT
GLOVE BIOGEL PI IND STRL 7.0 (GLOVE) ×2 IMPLANT
GLOVE BIOGEL PI INDICATOR 7.0 (GLOVE) ×4
GLOVE ECLIPSE 6.5 STRL STRAW (GLOVE) ×3 IMPLANT
GLOVE SURG SIGNA 7.5 PF LTX (GLOVE) ×3 IMPLANT
GOWN STRL REUS W/ TWL LRG LVL3 (GOWN DISPOSABLE) ×1 IMPLANT
GOWN STRL REUS W/ TWL XL LVL3 (GOWN DISPOSABLE) ×1 IMPLANT
GOWN STRL REUS W/TWL LRG LVL3 (GOWN DISPOSABLE) ×2
GOWN STRL REUS W/TWL XL LVL3 (GOWN DISPOSABLE) ×2
LIQUID BAND (GAUZE/BANDAGES/DRESSINGS) IMPLANT
NEEDLE HYPO 25X1 1.5 SAFETY (NEEDLE) ×3 IMPLANT
NS IRRIG 1000ML POUR BTL (IV SOLUTION) ×3 IMPLANT
PACK BASIN DAY SURGERY FS (CUSTOM PROCEDURE TRAY) ×3 IMPLANT
PENCIL BUTTON HOLSTER BLD 10FT (ELECTRODE) ×3 IMPLANT
SLEEVE SCD COMPRESS KNEE MED (MISCELLANEOUS) ×3 IMPLANT
SPONGE GAUZE 4X4 12PLY STER LF (GAUZE/BANDAGES/DRESSINGS) ×3 IMPLANT
SPONGE LAP 4X18 X RAY DECT (DISPOSABLE) ×3 IMPLANT
SUT ETHILON 2 0 FS 18 (SUTURE) ×3 IMPLANT
SUT MNCRL AB 4-0 PS2 18 (SUTURE) IMPLANT
SUT VIC AB 3-0 SH 27 (SUTURE)
SUT VIC AB 3-0 SH 27X BRD (SUTURE) IMPLANT
SWAB COLLECTION DEVICE MRSA (MISCELLANEOUS) IMPLANT
SWAB CULTURE ESWAB REG 1ML (MISCELLANEOUS) IMPLANT
SYR BULB 3OZ (MISCELLANEOUS) ×3 IMPLANT
SYR CONTROL 10ML LL (SYRINGE) ×3 IMPLANT
TOWEL OR 17X24 6PK STRL BLUE (TOWEL DISPOSABLE) ×3 IMPLANT
TOWEL OR NON WOVEN STRL DISP B (DISPOSABLE) ×3 IMPLANT
TUBE CONNECTING 20'X1/4 (TUBING)
TUBE CONNECTING 20X1/4 (TUBING) IMPLANT
YANKAUER SUCT BULB TIP NO VENT (SUCTIONS) IMPLANT

## 2015-11-03 NOTE — Interval H&P Note (Signed)
History and Physical Interval Note:no change in H and P  11/03/2015 1:01 PM  Margaret Dixon  has presented today for surgery, with the diagnosis of Chronic stitch abscesses  The various methods of treatment have been discussed with the patient and family. After consideration of risks, benefits and other options for treatment, the patient has consented to  Procedure(s): EXCISION CHRONIC ABDOMINAL WALL STITCH ABSCESS (N/A) as a surgical intervention .  The patient's history has been reviewed, patient examined, no change in status, stable for surgery.  I have reviewed the patient's chart and labs.  Questions were answered to the patient's satisfaction.     Agron Swiney A

## 2015-11-03 NOTE — Anesthesia Procedure Notes (Signed)
Procedure Name: LMA Insertion Date/Time: 11/03/2015 2:04 PM Performed by: Toula Moos L Pre-anesthesia Checklist: Patient identified, Emergency Drugs available, Suction available, Patient being monitored and Timeout performed Patient Re-evaluated:Patient Re-evaluated prior to inductionOxygen Delivery Method: Circle System Utilized Preoxygenation: Pre-oxygenation with 100% oxygen Intubation Type: IV induction Ventilation: Mask ventilation without difficulty LMA: LMA inserted LMA Size: 4.0 Number of attempts: 1 Airway Equipment and Method: Bite block Placement Confirmation: positive ETCO2 Tube secured with: Tape Dental Injury: Teeth and Oropharynx as per pre-operative assessment

## 2015-11-03 NOTE — Anesthesia Postprocedure Evaluation (Signed)
Anesthesia Post Note  Patient: Margaret Dixon  Procedure(s) Performed: Procedure(s) (LRB): EXCISION CHRONIC ABDOMINAL WALL STITCH ABSCESS (N/A)  Patient location during evaluation: PACU Anesthesia Type: General Level of consciousness: awake and alert Pain management: pain level controlled Vital Signs Assessment: post-procedure vital signs reviewed and stable Respiratory status: spontaneous breathing, nonlabored ventilation, respiratory function stable and patient connected to nasal cannula oxygen Cardiovascular status: blood pressure returned to baseline and stable Postop Assessment: no signs of nausea or vomiting Anesthetic complications: no    Last Vitals:  Filed Vitals:   11/03/15 1500 11/03/15 1510  BP: 117/71   Pulse: 86 82  Temp:    Resp: 26 13    Last Pain:  Filed Vitals:   11/03/15 1513  PainSc: 7                  Montez Hageman

## 2015-11-03 NOTE — Op Note (Signed)
EXCISION CHRONIC ABDOMINAL WALL STITCH ABSCESS  Procedure Note  Margaret Dixon 11/03/2015   Pre-op Diagnosis: Chronic stitch abscesses abdomen     Post-op Diagnosis: same  Procedure(s): EXCISION CHRONIC ABDOMINAL WALL STITCH ABSCESS  Surgeon(s): Coralie Keens, MD  Anesthesia: General  Staff:  Circulator: Glenna Fellows, RN Scrub Person: Izora Ribas, RN  Estimated Blood Loss: Minimal           Yarianna Varble A   Date: 11/03/2015  Time: 2:28 PM

## 2015-11-03 NOTE — Discharge Instructions (Signed)
Ok to shower tomorrow  Expect some drainage  Call your surgeon if you experience:   1.  Fever over 101.0. 2.  Inability to urinate. 3.  Nausea and/or vomiting. 4.  Extreme swelling or bruising at the surgical site. 5.  Continued bleeding from the incision. 6.  Increased pain, redness or drainage from the incision. 7.  Problems related to your pain medication. 8.  Any problems and/or concerns.   Post Anesthesia Home Care Instructions  Activity: Get plenty of rest for the remainder of the day. A responsible adult should stay with you for 24 hours following the procedure.  For the next 24 hours, DO NOT: -Drive a car -Paediatric nurse -Drink alcoholic beverages -Take any medication unless instructed by your physician -Make any legal decisions or sign important papers.  Meals: Start with liquid foods such as gelatin or soup. Progress to regular foods as tolerated. Avoid greasy, spicy, heavy foods. If nausea and/or vomiting occur, drink only clear liquids until the nausea and/or vomiting subsides. Call your physician if vomiting continues.  Special Instructions/Symptoms: Your throat may feel dry or sore from the anesthesia or the breathing tube placed in your throat during surgery. If this causes discomfort, gargle with warm salt water. The discomfort should disappear within 24 hours.  If you had a scopolamine patch placed behind your ear for the management of post- operative nausea and/or vomiting:  1. The medication in the patch is effective for 72 hours, after which it should be removed.  Wrap patch in a tissue and discard in the trash. Wash hands thoroughly with soap and water. 2. You may remove the patch earlier than 72 hours if you experience unpleasant side effects which may include dry mouth, dizziness or visual disturbances. 3. Avoid touching the patch. Wash your hands with soap and water after contact with the patch.

## 2015-11-03 NOTE — Anesthesia Preprocedure Evaluation (Addendum)
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, has seen cardiology and note discussed risk reduction strategies   Neuro/Psych Depression  Neuromuscular disease    GI/Hepatic negative GI ROS, Neg liver ROS,   Endo/Other  negative endocrine ROS  Renal/GU negative Renal ROS     Musculoskeletal  (+) Fibromyalgia -  Abdominal   Peds  Hematology negative hematology ROS (+)   Anesthesia Other Findings   Reproductive/Obstetrics negative OB ROS                            Anesthesia Physical  Anesthesia Plan  ASA: II  Anesthesia Plan: General   Post-op Pain Management:    Induction: Intravenous  Airway Management Planned: Oral ETT and LMA  Additional Equipment:   Intra-op Plan:   Post-operative Plan:   Informed Consent: I have reviewed the patients History and Physical, chart, labs and discussed the procedure including the risks, benefits and alternatives for the proposed anesthesia with the patient or authorized representative who has indicated his/her understanding and acceptance.   Dental Advisory Given  Plan Discussed with: Anesthesiologist, CRNA and Surgeon  Anesthesia Plan Comments: (Triple anti-emetic therapy)        Anesthesia Quick Evaluation

## 2015-11-03 NOTE — Transfer of Care (Signed)
Immediate Anesthesia Transfer of Care Note  Patient: Margaret Dixon  Procedure(s) Performed: Procedure(s): EXCISION CHRONIC ABDOMINAL WALL STITCH ABSCESS (N/A)  Patient Location: PACU  Anesthesia Type:General  Level of Consciousness: sedated  Airway & Oxygen Therapy: Patient Spontanous Breathing and Patient connected to face mask oxygen  Post-op Assessment: Report given to RN and Post -op Vital signs reviewed and stable  Post vital signs: Reviewed and stable  Last Vitals:  Filed Vitals:   11/03/15 1440 11/03/15 1441  BP:    Pulse: 92 89  Temp:    Resp: 7 24    Complications: No apparent anesthesia complications

## 2015-11-04 ENCOUNTER — Encounter (HOSPITAL_BASED_OUTPATIENT_CLINIC_OR_DEPARTMENT_OTHER): Payer: Self-pay | Admitting: Surgery

## 2015-11-04 NOTE — Op Note (Signed)
NAMEMarland Kitchen  KEN, OTOOLE NO.:  [id number]  MEDICAL RECORD NO.:  [id number]  LOCATION:                                 FACILITY:  PHYSICIAN:  Coralie Keens, M.D. DATE OF BIRTH:  1960-10-27  DATE OF PROCEDURE:  11/03/2015 DATE OF DISCHARGE:                              OPERATIVE REPORT   PREOPERATIVE DIAGNOSIS:  Chronic abdominal wall stitch abscess.  POSTOPERATIVE DIAGNOSIS:  Chronic abdominal wall stitch abscess.  PROCEDURE:  Excision of chronic abdominal wall stitch abscess x2.  SURGEON:  Coralie Keens, M.D.  ANESTHESIA:  General with 0.5% Marcaine.  ESTIMATED BLOOD LOSS:  Minimal.  INDICATIONS:  This is a 55 year old female, who had undergone exploratory laparotomy on July 28, 2015.  She developed 2 separate areas of wound infections over a month after surgery.  These have persisted under consistent with chronic stitch abscesses.  Decision was made to proceed with excision of both these areas.  PROCEDURE IN DETAIL:  The patient was brought to the operating room, identified as Margaret Dixon.  She was placed supine on the operating table, and general anesthesia was induced.  Her abdomen was then prepped and draped in usual sterile fashion.  She had 2 small openings with chronic granulation tissue on her midline incision.  I excised both these areas with elliptical incisions with a scalpel taking down into the chronic granulation tissue and previous scar.  I was able to excise underlying suture material from both areas.  I then excised all the chronic granulation tissue as well.  Both wounds appeared clean.  I then irrigated both wounds with saline.  Hemostasis achieved with cautery.  I then anesthetized further with Marcaine and then loosely approximated the skin with interrupted 2-0 nylon sutures at both areas.  Dry gauze and tape were then applied.  The patient tolerated the procedure well. All the counts were correct at the end of procedure.   The patient was then extubated in the operating room and taken in a stable condition to the recovery room.     Coralie Keens, M.D.     DB/MEDQ  D:  11/03/2015  T:  11/03/2015  Job:  [id number]

## 2015-11-12 ENCOUNTER — Telehealth: Payer: Self-pay | Admitting: General Surgery

## 2015-11-12 NOTE — Telephone Encounter (Signed)
Pt called stating her wound opened and is oozing blood after getting stiches taken out yesterday.  Recommended she keep the area covered and call us if the bleeding or drainage got worse.  She will call the office on Mon if she needs further assistance.

## 2020-12-29 LAB — HM COLONOSCOPY

## 2021-12-02 ENCOUNTER — Encounter (HOSPITAL_COMMUNITY): Payer: Self-pay

## 2021-12-02 ENCOUNTER — Inpatient Hospital Stay (HOSPITAL_COMMUNITY)
Admission: EM | Admit: 2021-12-02 | Discharge: 2021-12-04 | DRG: 247 | Disposition: A | Discharge status: Home / Self Care | Payer: Managed Care, Other (non HMO) | Attending: Cardiovascular Disease | Admitting: Cardiovascular Disease

## 2021-12-02 ENCOUNTER — Emergency Department (HOSPITAL_COMMUNITY): Payer: Managed Care, Other (non HMO)

## 2021-12-02 DIAGNOSIS — Z79890 Hormone replacement therapy (postmenopausal): Secondary | ICD-10-CM | POA: Diagnosis not present

## 2021-12-02 DIAGNOSIS — E785 Hyperlipidemia, unspecified: Secondary | ICD-10-CM | POA: Diagnosis present

## 2021-12-02 DIAGNOSIS — I2510 Atherosclerotic heart disease of native coronary artery without angina pectoris: Secondary | ICD-10-CM | POA: Diagnosis present

## 2021-12-02 DIAGNOSIS — F32A Depression, unspecified: Secondary | ICD-10-CM | POA: Diagnosis present

## 2021-12-02 DIAGNOSIS — I249 Acute ischemic heart disease, unspecified: Principal | ICD-10-CM | POA: Diagnosis present

## 2021-12-02 DIAGNOSIS — Z7982 Long term (current) use of aspirin: Secondary | ICD-10-CM

## 2021-12-02 DIAGNOSIS — I214 Non-ST elevation (NSTEMI) myocardial infarction: Principal | ICD-10-CM | POA: Diagnosis present

## 2021-12-02 DIAGNOSIS — M797 Fibromyalgia: Secondary | ICD-10-CM | POA: Diagnosis present

## 2021-12-02 DIAGNOSIS — Z79899 Other long term (current) drug therapy: Secondary | ICD-10-CM

## 2021-12-02 DIAGNOSIS — Z8371 Family history of colonic polyps: Secondary | ICD-10-CM

## 2021-12-02 DIAGNOSIS — E079 Disorder of thyroid, unspecified: Secondary | ICD-10-CM | POA: Diagnosis present

## 2021-12-02 DIAGNOSIS — Z825 Family history of asthma and other chronic lower respiratory diseases: Secondary | ICD-10-CM | POA: Diagnosis not present

## 2021-12-02 DIAGNOSIS — Z8249 Family history of ischemic heart disease and other diseases of the circulatory system: Secondary | ICD-10-CM | POA: Diagnosis not present

## 2021-12-02 DIAGNOSIS — Z833 Family history of diabetes mellitus: Secondary | ICD-10-CM

## 2021-12-02 DIAGNOSIS — Z880 Allergy status to penicillin: Secondary | ICD-10-CM

## 2021-12-02 DIAGNOSIS — I10 Essential (primary) hypertension: Secondary | ICD-10-CM | POA: Diagnosis present

## 2021-12-02 DIAGNOSIS — F17210 Nicotine dependence, cigarettes, uncomplicated: Secondary | ICD-10-CM | POA: Diagnosis present

## 2021-12-02 DIAGNOSIS — I248 Other forms of acute ischemic heart disease: Secondary | ICD-10-CM | POA: Diagnosis not present

## 2021-12-02 DIAGNOSIS — Z9049 Acquired absence of other specified parts of digestive tract: Secondary | ICD-10-CM | POA: Diagnosis not present

## 2021-12-02 DIAGNOSIS — N951 Menopausal and female climacteric states: Secondary | ICD-10-CM

## 2021-12-02 DIAGNOSIS — Z881 Allergy status to other antibiotic agents status: Secondary | ICD-10-CM

## 2021-12-02 DIAGNOSIS — R946 Abnormal results of thyroid function studies: Secondary | ICD-10-CM | POA: Diagnosis present

## 2021-12-02 DIAGNOSIS — Z955 Presence of coronary angioplasty implant and graft: Secondary | ICD-10-CM

## 2021-12-02 DIAGNOSIS — Z780 Asymptomatic menopausal state: Secondary | ICD-10-CM

## 2021-12-02 DIAGNOSIS — Z720 Tobacco use: Secondary | ICD-10-CM

## 2021-12-02 LAB — LIPID PANEL
Cholesterol: 192 mg/dL (ref 0–200)
HDL: 39 mg/dL — ABNORMAL LOW (ref >40)
LDL Cholesterol: 110 mg/dL — ABNORMAL HIGH (ref 0–99)
Total CHOL/HDL Ratio: 4.9 RATIO
Triglycerides: 214 mg/dL — ABNORMAL HIGH (ref <150)
VLDL: 43 mg/dL — ABNORMAL HIGH (ref 0–40)

## 2021-12-02 LAB — BASIC METABOLIC PANEL
Anion gap: 8 (ref 5–15)
BUN: 15 mg/dL (ref 8–23)
CO2: 24 mmol/L (ref 22–32)
Calcium: 9.2 mg/dL (ref 8.9–10.3)
Chloride: 108 mmol/L (ref 98–111)
Creatinine, Ser: 0.93 mg/dL (ref 0.44–1.00)
GFR, Estimated: >60 mL/min (ref >60)
Glucose, Bld: 135 mg/dL — ABNORMAL HIGH (ref 70–99)
Potassium: 3.6 mmol/L (ref 3.5–5.1)
Sodium: 140 mmol/L (ref 135–145)

## 2021-12-02 LAB — HEMOGLOBIN A1C
Hgb A1c MFr Bld: 5.8 % — ABNORMAL HIGH (ref 4.8–5.6)
Mean Plasma Glucose: 119.76 mg/dL

## 2021-12-02 LAB — TROPONIN I (HIGH SENSITIVITY)
Troponin I (High Sensitivity): 24 ng/L — ABNORMAL HIGH (ref <18)
Troponin I (High Sensitivity): 83 ng/L — ABNORMAL HIGH (ref <18)

## 2021-12-02 LAB — CBC
HCT: 42 % (ref 36.0–46.0)
Hemoglobin: 14 g/dL (ref 12.0–15.0)
MCH: 30.5 pg (ref 26.0–34.0)
MCHC: 33.3 g/dL (ref 30.0–36.0)
MCV: 91.5 fL (ref 80.0–100.0)
Platelets: 248 10*3/uL (ref 150–400)
RBC: 4.59 MIL/uL (ref 3.87–5.11)
RDW: 13.1 % (ref 11.5–15.5)
WBC: 8.2 10*3/uL (ref 4.0–10.5)
nRBC: 0 % (ref 0.0–0.2)

## 2021-12-02 LAB — TSH: TSH: 7.92 u[IU]/mL — ABNORMAL HIGH (ref 0.350–4.500)

## 2021-12-02 LAB — HIV ANTIBODY (ROUTINE TESTING W REFLEX): HIV Screen 4th Generation wRfx: NONREACTIVE

## 2021-12-02 MED ORDER — SODIUM CHLORIDE 0.9 % WEIGHT BASED INFUSION
1.0000 mL/kg/h | INTRAVENOUS | Status: DC
  Administered 2021-12-04: 1 mL/kg/h via INTRAVENOUS

## 2021-12-02 MED ORDER — SODIUM CHLORIDE 0.9% FLUSH: 3.0000 mL | INTRAVENOUS | Status: DC | PRN

## 2021-12-02 MED ORDER — ONDANSETRON HCL 4 MG/2ML IJ SOLN
4.0000 mg | Freq: Four times a day (QID) | INTRAMUSCULAR | Status: DC | PRN
  Administered 2021-12-03: 4 mg via INTRAVENOUS
  Filled 2021-12-02: qty 2

## 2021-12-02 MED ORDER — SODIUM CHLORIDE 0.9 % WEIGHT BASED INFUSION
3.0000 mL/kg/h | INTRAVENOUS | Status: DC
  Administered 2021-12-04: 3 mL/kg/h via INTRAVENOUS

## 2021-12-02 MED ORDER — ASPIRIN EC 81 MG PO TBEC
81.0000 mg | DELAYED_RELEASE_TABLET | Freq: Every day | ORAL | Status: DC
  Administered 2021-12-03: 81 mg via ORAL
  Filled 2021-12-02: qty 1

## 2021-12-02 MED ORDER — ATORVASTATIN CALCIUM 80 MG PO TABS
80.0000 mg | ORAL_TABLET | Freq: Every day | ORAL | Status: DC
  Administered 2021-12-02 – 2021-12-04 (×3): 80 mg via ORAL
  Filled 2021-12-02 (×3): qty 1

## 2021-12-02 MED ORDER — ACETAMINOPHEN 325 MG PO TABS
650.0000 mg | ORAL_TABLET | ORAL | Status: DC | PRN
  Administered 2021-12-03 – 2021-12-04 (×2): 650 mg via ORAL
  Filled 2021-12-02 (×2): qty 2

## 2021-12-02 MED ORDER — CIPROFLOXACIN HCL 500 MG PO TABS
500.0000 mg | ORAL_TABLET | Freq: Two times a day (BID) | ORAL | Status: DC
  Administered 2021-12-02 – 2021-12-03 (×3): 500 mg via ORAL
  Filled 2021-12-02 (×4): qty 1

## 2021-12-02 MED ORDER — HEPARIN BOLUS VIA INFUSION
4000.0000 [IU] | Freq: Once | INTRAVENOUS | Status: AC
Start: 2021-12-02 — End: 2021-12-02
  Administered 2021-12-02: 4000 [IU] via INTRAVENOUS

## 2021-12-02 MED ORDER — ASPIRIN 81 MG PO CHEW
81.0000 mg | CHEWABLE_TABLET | ORAL | Status: AC
  Administered 2021-12-04: 81 mg via ORAL
  Filled 2021-12-02: qty 1

## 2021-12-02 MED ORDER — NICOTINE 14 MG/24HR TD PT24
14.0000 mg | MEDICATED_PATCH | Freq: Every day | TRANSDERMAL | Status: DC
  Administered 2021-12-02 – 2021-12-04 (×3): 14 mg via TRANSDERMAL
  Filled 2021-12-02 (×3): qty 1

## 2021-12-02 MED ORDER — NITROGLYCERIN 0.4 MG SL SUBL: 0.4000 mg | SUBLINGUAL_TABLET | SUBLINGUAL | Status: DC | PRN

## 2021-12-02 MED ORDER — SODIUM CHLORIDE 0.9% FLUSH
3.0000 mL | Freq: Two times a day (BID) | INTRAVENOUS | Status: DC
  Administered 2021-12-03: 3 mL via INTRAVENOUS

## 2021-12-02 MED ORDER — MUPIROCIN 2 % EX OINT
1.0000 | TOPICAL_OINTMENT | Freq: Two times a day (BID) | CUTANEOUS | Status: DC
Start: 2021-12-02 — End: 2021-12-03

## 2021-12-02 MED ORDER — HEPARIN (PORCINE) 25000 UT/250ML-% IV SOLN
1000.0000 [IU]/h | INTRAVENOUS | Status: DC
Start: 2021-12-02 — End: 2021-12-04
  Administered 2021-12-02: 800 [IU]/h via INTRAVENOUS
  Administered 2021-12-03: 1000 [IU]/h via INTRAVENOUS
  Filled 2021-12-02 (×2): qty 250

## 2021-12-02 MED ORDER — SODIUM CHLORIDE 0.9 % IV SOLN: 250.0000 mL | INTRAVENOUS | Status: DC | PRN

## 2021-12-02 NOTE — ED Triage Notes (Signed)
Pt brought in by [facility] for chest pain. Pt c/o arm pain for the last week, was having chest pain and discomfort down L arm. Pt denies chest pain at this moment, just heaviness and arm pain. ? ?4 baby aspirin prior to EMS arrival. ? ?Had been having abd pain and was suspected to have UTI. Currently taking cipro. ?

## 2021-12-02 NOTE — Progress Notes (Signed)
ANTICOAGULATION CONSULT NOTE - Initial Consult ? ?Pharmacy Consult for heparin gtt  ?Indication: chest pain/ACS ? ?Allergies  ?Allergen Reactions  ? Penicillins Hives and Rash  ?  Has patient had a PCN reaction causing immediate rash, facial/tongue/throat swelling, SOB or lightheadedness with hypotension: Yes ?Has patient had a PCN reaction causing severe rash involving mucus membranes or skin necrosis: No ?Has patient had a PCN reaction that required hospitalization No ?Has patient had a PCN reaction occurring within the last 10 years: No ?If all of the above answers are "NO", then may proceed with Cephalosporin use. ?  ? Cephalexin   ? Clarithromycin   ? Levofloxacin   ? ? ?Patient Measurements: ?Height: '5\' 4"'$  (162.6 cm) ?Weight: 65.8 kg (145 lb) ?IBW/kg (Calculated) : 54.7 ?Heparin Dosing Weight: HEPARIN DW (KG): 65.8 ? ? ?Vital Signs: ?Temp: 98.2 ?F (36.8 ?C) (04/08 1136) ?Temp Source: Oral (04/08 1136) ?BP: 158/79 (04/08 1600) ?Pulse Rate: 68 (04/08 1600) ? ?Labs: ?Recent Labs  ?  12/02/21 ?1155  ?HGB 14.0  ?HCT 42.0  ?PLT 248  ?CREATININE 0.93  ? ? ?Estimated Creatinine Clearance: 58.5 mL/min (by C-G formula based on SCr of 0.93 mg/dL). ? ? ?Medical History: ?Past Medical History:  ?Diagnosis Date  ? Constipation   ? Depression   ? due to Fibromyalgia  ? Diarrhea   ? Fibromyalgia   ? Hormone replacement therapy, postmenopausal   ? PONV (postoperative nausea and vomiting)   ? ? ?Medications:  ?(Not in a hospital admission)  ?Scheduled:  ? heparin  4,000 Units Intravenous Once  ? ?Infusions:  ? heparin    ? ?PRN:  ?Anti-infectives (From admission, onward)  ? ? None  ? ?  ? ? ?Assessment: ?Margaret Dixon a 61 y.o. female requires anticoagulation with a heparin iv infusion for the indication of  chest pain/ACS. Heparin gtt will be started following pharmacy protocol per pharmacy consult. Patient is not on previous oral anticoagulant that will require aPTT/HL correlation before transitioning to only HL  monitoring.  ? ?Goal of Therapy:  ?Heparin level 0.3-0.7 units/ml ?Monitor platelets by anticoagulation protocol: Yes ?  ?Plan:  ?Give 4000 units bolus x 1 ?Start heparin infusion at 800 units/hr ?Check anti-Xa level in 6 hours and daily while on heparin ?Continue to monitor H&H and platelets ? ? ?Margaret Dixon ?12/02/2021,4:23 PM ?  ?

## 2021-12-02 NOTE — Progress Notes (Addendum)
Patient arrived to room 3E28 from [facility].  Assessment complete, VS obtained, and Admission database began.  On call provider notified.  Heparin infusing at 8 ml/hr ?

## 2021-12-02 NOTE — ED Provider Notes (Signed)
?[facility] ?Provider Note ? ? ?CSN: [id number] ?Arrival date & time: 12/02/21  1119 ? ?  ? ?History ? ?Chief Complaint  ?Patient presents with  ? Chest Pain  ? Arm Pain  ? ? ?Margaret Dixon is a 61 y.o. female. ? ? ?Chest Pain ?Associated symptoms: shortness of breath   ?Associated symptoms: no dizziness, no headache, no nausea, no palpitations, no vomiting and no weakness   ?Arm Pain ?Associated symptoms include chest pain and shortness of breath. Pertinent negatives include no headaches.  ? ?  ? ?Margaret Dixon is a 61 y.o. female with hx of CAD diagnosed in 2013, seen by Dr. Percival Spanish, and had CT angiography of the heart that showed age advanced nonobstructive coronary artery disease in 2013 who presents to the Emergency Department today complaining of heaviness of her left chest this morning at 5 AM.  She was getting ready for work when her symptoms began.  She also noticed a sharp pain radiating down her left arm to her fingers and she was having some shortness of breath.  She went to work this morning and symptoms persisted.  Coworkers gave her four 81 mg aspirin and 1 dose of Mylanta with improvement of her symptoms.  Symptoms much improved on arrival.  She feels some pain along the base of her left neck, but denies jaw pain, diaphoresis, nausea or vomiting.  No back pain .  Former smoker, but she began smoking cigarettes again due to stress and anxiety from recently losing her father. ? ?Home Medications ?Prior to Admission medications   ?Medication Sig Start Date End Date Taking? Authorizing Provider  ?clindamycin (CLEOCIN) 300 MG capsule Take 1 capsule (300 mg total) by mouth 3 (three) times daily. 11/03/15   Coralie Keens, MD  ?cyanocobalamin (,VITAMIN B-12,) 1000 MCG/ML injection Inject 1,000 mcg into the muscle every 14 (fourteen) days.    [provider]  ?DULoxetine (CYMBALTA) 30 MG capsule Take 30 mg by mouth daily. 04/29/14   [provider]  ?estradiol  (ESTRACE) 1 MG tablet Take 1 mg by mouth daily.    [provider]  ?HYDROcodone-acetaminophen (VICODIN) 5-500 MG tablet Take 1-2 tablets by mouth every 4 (four) hours as needed. For pain associated with fibromyalgia 11/03/15   Coralie Keens, MD  ?LORazepam (ATIVAN) 0.5 MG tablet Take 0.5 mg by mouth at bedtime as needed. For muscle relaxant associated with fibromyalgia    [provider]  ?Vitamin D, Ergocalciferol, (DRISDOL) 50000 units CAPS capsule Take 50,000 Units by mouth every 7 (seven) days.    [provider]  ?   ? ?Allergies    ?Penicillins, Cephalexin, Clarithromycin, and Levofloxacin   ? ?Review of Systems   ?Review of Systems  ?Respiratory:  Positive for shortness of breath.   ?Cardiovascular:  Positive for chest pain. Negative for palpitations and leg swelling.  ?Gastrointestinal:  Negative for nausea and vomiting.  ?Neurological:  Negative for dizziness, syncope, weakness and headaches.  ?All other systems reviewed and are negative. ? ?Physical Exam ?Updated Vital Signs ?BP (!) 148/72   Pulse 74   Temp 98.2 ?F (36.8 ?C) (Oral)   Resp 18   Ht '5\' 4"'$  (1.626 m)   Wt 65.8 kg   SpO2 96%   BMI 24.89 kg/m?  ?Physical Exam ?Vitals and nursing note reviewed.  ?Constitutional:   ?   General: She is not in acute distress. ?   Appearance: She is well-developed. She is not toxic-appearing.  ?  Comments: Patient appears anxious and tearful  ?HENT:  ?   Mouth/Throat:  ?   Mouth: Mucous membranes are moist.  ?Cardiovascular:  ?   Rate and Rhythm: Normal rate and regular rhythm.  ?   Pulses: Normal pulses.  ?Pulmonary:  ?   Effort: Pulmonary effort is normal.  ?   Breath sounds: Normal breath sounds.  ?Chest:  ?   Chest wall: No tenderness.  ?Abdominal:  ?   Palpations: Abdomen is soft.  ?   Tenderness: There is no abdominal tenderness.  ?Musculoskeletal:     ?   General: Normal range of motion.  ?   Cervical back: No tenderness.  ?   Right lower leg: No edema.  ?   Left lower leg:  No edema.  ?Skin: ?   General: Skin is warm.  ?   Capillary Refill: Capillary refill takes less than 2 seconds.  ?Neurological:  ?   General: No focal deficit present.  ?   Mental Status: She is alert.  ?   Sensory: No sensory deficit.  ?   Motor: No weakness.  ? ? ?ED Results / Procedures / Treatments   ?Labs ?(all labs ordered are listed, but only abnormal results are displayed) ?Labs Reviewed  ?BASIC METABOLIC PANEL - Abnormal; Notable for the following components:  ?    Result Value  ? Glucose, Bld 135 (*)   ? All other components within normal limits  ?TROPONIN I (HIGH SENSITIVITY) - Abnormal; Notable for the following components:  ? Troponin I (High Sensitivity) 24 (*)   ? All other components within normal limits  ?TROPONIN I (HIGH SENSITIVITY) - Abnormal; Notable for the following components:  ? Troponin I (High Sensitivity) 83 (*)   ? All other components within normal limits  ?CBC  ? ? ?EKG ?EKG Interpretation ? ?Date/Time:  Saturday December 02 2021 11:33:31 EDT ?Ventricular Rate:  72 ?PR Interval:  139 ?QRS Duration: 98 ?QT Interval:  407 ?QTC Calculation: 446 ?R Axis:   72 ?Text Interpretation: Sinus rhythm since last tracing no significant change Confirmed by Daleen Bo [phone]) on 12/02/2021 11:48:25 AM ? ?Radiology ?DG Chest 2 View ? ?Result Date: 12/02/2021 ?CLINICAL DATA:  Chest pain.  Left arm pain for the last week EXAM: CHEST - 2 VIEW COMPARISON:  01/24/2012 FINDINGS: The heart size and mediastinal contours are within normal limits. Both lungs are clear. The visualized skeletal structures are unremarkable. IMPRESSION: No active cardiopulmonary disease. Electronically Signed   By: Jorje Guild M.D.   On: 12/02/2021 12:11   ? ?Procedures ?Procedures  ? ? ?Medications Ordered in ED ?Medications - No data to display ? ?ED Course/ Medical Decision Making/ A&P ?  ?                        ?Medical Decision Making ?Patient here with left-sided chest pain and left arm pain since 5 AM this morning.  Pain  resolved on arrival here and after taking four 81 mg aspirin.  History of nonobstructive coronary artery disease diagnosed in 2013 no cardiac follow-up since that time.  Patient is a smoker. ? ?She was seen by Dr. Percival Spanish with cardiology in 2013 had CT angiography that showed nonobstructive disease. ? ?On my exam, patient's pain has resolved.  Mildly hypertensive but vitals otherwise reassuring ? ?Amount and/or Complexity of Data Reviewed ?External Data Reviewed: notes. ?Labs: ordered. ?   Details: Labs interpreted by me, no evidence of leukocytosis or  anemia.  Electrolytes unremarkable.  Initial troponin elevated at 24, delta troponin 83 ?Radiology: ordered. ?   Details: Chest x-ray without acute cardiopulmonary process ?ECG/medicine tests: ordered. ?   Details: EKG without acute ischemic change ?Discussion of management or test interpretation with external provider(s): Patient with elevated delta troponin, history concerning for cardiac process.  Will consult cardiology, heparin ordered ? ?Risk ?Risk Details: Discussed findings with cards, Dr. Johnsie Cancel who accepts pt and recommends transfer to cardiac tele at [facility] ? ? ? ? ? ? ? ? ?Final Clinical Impression(s) / ED Diagnoses ?Final diagnoses:  ?ACS (acute coronary syndrome) ([place])  ? ? ?Rx / DC Orders ?ED Discharge Orders   ? ? None  ? ?  ? ? ?  ?Kem Parkinson, PA-C ?12/02/21 1628 ? ?  ?Daleen Bo, MD ?12/03/21 2108 ? ?

## 2021-12-02 NOTE — ED Notes (Signed)
Pt ambulated to the restroom.

## 2021-12-02 NOTE — H&P (Signed)
?Cardiology Admission History and Physical:  ? ?Patient ID: Margaret Dixon ?MRN: [id number]; DOB: 1960/12/13  ? ?Admission date: d ? ?Primary Care Provider: Doyle Askew, PA-C ?[facility] Cardiologist: None  ?[facility]rophysiologist:  None  ? ?Chief Complaint: chest pain  ? ?Patient Profile:  ? ?Margaret Dixon is a 61 y.o. female with nonobstructive CAD, prior tobacco use, MDD, and fibromyalgia who presents with chest pain and elevated hsT c/w NSTEMI.  ? ?History of Present Illness:  ? ?Margaret Dixon was brought in by EMS for chest pain with radiation to the arm with associated nausea that has occurred over the past week but significantly worsened in frequency and severity today.  She noticed that over the past several days she has had intermittent moderate chest discomfort which she mainly describes as chest pressure however this radiated down her left arm into her fingertips and a sharp pain.  She also had some nausea associated with this but denied any emesis.  She initially thought this may be gas/reflux and did not take any medications.  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  They have been fairly constant all week long and worse with exertional activities.  The worst pain she experienced today was 8/10 severity while helping one of the facility residents get dressed.  She denies any associated diaphoresis.  She was given aspirin 324 mg by nursing staff at the facility prior to EMS arrival.  She has not taken any nitroglycerin today and does not have any at home.  She did notice improvement in her symptoms with chest pain down to 3/10 severity after receiving morphine.  ? ?VS on arrival to [facility] ED: P 74, BP 148/72, O2 96%/RA, RR 18, T 98.98F ? ?VS during my evaluation:  ?P 62, BP 166/84 (110), O2 98%/RA, RR 13 ? ?She was resting comfortably in bed and was conversant about her symptoms.  She had associated shortness of breath but denied any nausea, emesis, palpitations, orthopnea, PND, or lower extremity edema.  She has not had any recent sick contacts and denies any fever or chills.  She was started on ciprofloxacin Friday morning for suspected UTI with urine culture pending from her primary care physician after she experienced abdominal pain and urinary frequency.  Other than coronary CT in 2013 which showed moderate nonobstructive CAD she has not had coronary evaluation, prior catheterizations or cardiac surgery before.  She does not have family history of premature CAD or sudden cardiac death.  She is the last surviving of 5 siblings however all passed from other causes.  1 brother passed in 2010 at age 63 from lung issues while 2 other sisters passed at age 73 and 44 from lung issues.  She had an older sister who passed at age 9 from a ruptured brain aneurysm.  Her mother passed in 2004 and father passed in 2021.  Neither had known heart issues.  She is not clear what lung issues her family has but stated that several had severe COPD.  She has 1 daughter in [place] with no health issues.  Her prior surgical history includes appendectomy, cholecystectomy, and exploratory laparotomy for colonic adhesions. ? ?She feels that some of her symptoms are similar to her 2013 evaluation however these current symptoms are more severe and she did not have associated left arm pain or nausea to her prior  presentation. ? ?She is currently smoking again around 1/2 pack/day and has been smoking since her father passed 2 years ago.  Prior to that she had quit for 5 years and before that she had been smoking between half to 1 pack/day since her 2s.  She knows she needs to quit and is successfully done so before but said that life stressors resulted in her starting back again when her father passed.  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 ? ?She was  evaluated by cardiology in 2013 by Dr. Percival Spanish when she presented with presyncope, rapid heart rates, dizziness, and subsequent chest discomfort that was left-sided without radiation and no associated symptoms.  She could not bring her symptoms all with activities but did report some fatigue and shortness of breath with walking incline.  She denies any PND or orthopnea. ? ?Past Medical History:  ?Diagnosis Date  ? Constipation   ? Depression   ? due to Fibromyalgia  ? Diarrhea   ? Fibromyalgia   ? Hormone replacement therapy, postmenopausal   ? PONV (postoperative nausea and vomiting)   ? ?Past Surgical History:  ?Procedure Laterality Date  ? ABDOMINAL ADHESION SURGERY  07/28/2015  ? APPENDECTOMY    ? CHOLECYSTECTOMY    ? COLONOSCOPY    ? INCISION AND DRAINAGE ABSCESS N/A 11/03/2015  ? Procedure: EXCISION CHRONIC ABDOMINAL WALL STITCH ABSCESS;  Surgeon: Coralie Keens, MD;  Location: [facility];  Service: General;  Laterality: N/A;  ? LAPAROTOMY N/A 07/28/2015  ? Procedure: EXPLORATORY LAPAROTOMY;  Surgeon: Coralie Keens, MD;  Location: [facility];  Service: General;  Laterality: N/A;  ? LYSIS OF ADHESION N/A 07/28/2015  ? Procedure: LYSIS OF ADHESION;  Surgeon: Coralie Keens, MD;  Location: [facility];  Service: General;  Laterality: N/A;  ? TONSILLECTOMY    ? VAGINAL HYSTERECTOMY    ?  ?Medications Prior to Admission: ?Prior to Admission medications   ?Medication Sig Start Date End Date Taking? Authorizing Provider  ?Ascorbic Acid (VITAMIN C GUMMIES PO) Take by mouth.   Yes [provider]  ?cyanocobalamin (,VITAMIN B-12,) 1000 MCG/ML injection Inject 1,000 mcg into the muscle every 14 (fourteen) days.   Yes [provider]  ?estradiol (ESTRACE) 1 MG tablet Take 1 mg by mouth daily.   Yes [provider]  ?Multiple Vitamins-Minerals (MULTIVITAMIN GUMMIES ADULT) CHEW Chew by mouth.   Yes [provider]  ?Vitamin D, Ergocalciferol, (DRISDOL) 50000 units CAPS capsule Take  50,000 Units by mouth every 7 (seven) days.   Yes [provider]  ?clindamycin (CLEOCIN) 300 MG capsule Take 1 capsule (300 mg total) by mouth 3 (three) times daily. ?Patient not taking: Reported on 12/02/2021 11/03/15   Coralie Keens, MD  ?HYDROcodone-acetaminophen (VICODIN) 5-500 MG tablet Take 1-2 tablets by mouth every 4 (four) hours as needed. For pain associated with fibromyalgia ?Patient not taking: Reported on 12/02/2021 11/03/15   Coralie Keens, MD  ?  ?Allergies:    ?Allergies  ?Allergen Reactions  ? Penicillins Hives and Rash  ?  Has patient had a PCN reaction causing immediate rash, facial/tongue/throat swelling, SOB or lightheadedness with hypotension: Yes ?Has patient had a PCN reaction causing severe rash involving mucus membranes or skin necrosis: No ?Has patient had a PCN reaction that required hospitalization No ?Has patient had a PCN reaction occurring within the last 10 years: No ?If all of the above answers are "NO", then may proceed with Cephalosporin use. ?  ?  Cephalexin   ? Clarithromycin   ? Levofloxacin   ? ?Social History:   ?Social History  ? ?Socioeconomic History  ? Marital status: Divorced  ?  Spouse name: Not on file  ? Number of children: 2  ? Years of education: Not on file  ? Highest education level: Not on file  ?Occupational History  ? Not on file  ?Tobacco Use  ? Smoking status: Former  ?  Packs/day: 0.30  ?  Years: 8.00  ?  Pack years: 2.40  ?  Types: Cigarettes  ?  Quit date: 07/26/2014  ?  Years since quitting: 7.3  ? Smokeless tobacco: Never  ?Substance and Sexual Activity  ? Alcohol use: Yes  ?  Comment: socially  ? Drug use: No  ? Sexual activity: Not on file  ?Other Topics Concern  ? Not on file  ?Social History Narrative  ? Lives alone.  Works at [employer].    ? ?Social Determinants of Health  ? ?Financial Resource Strain: Not on file  ?Food Insecurity: Not on file  ?Transportation Needs: Not on file  ?Physical Activity: Not on file  ?Stress: Not  on file  ?Social Connections: Not on file  ?Intimate Partner Violence: Not on file  ?  ?Family History:   ?The patient's family history includes Cancer in her mother; Colon polyps in her father; Coronary ar

## 2021-12-02 NOTE — ED Provider Notes (Signed)
?  Face-to-face evaluation ? ? ?History: She presents for evaluation of intermittent episodes of chest pain, left-sided radiating to left arm.  Similar problem about 8 years ago when she was hospitalized and seen by cardiology.  She did not have stress testing done at that time.  She is currently smoking.  She does not know her cholesterol level. ? ?Physical exam: Alert elderly female.  She is calm and comfortable.  No respiratory distress.  No dysarthria or aphasia. ? ?MDM: Evaluation for  ?Chief Complaint  ?Patient presents with  ? Chest Pain  ? Arm Pain  ?  ? ?Nonspecific chest pain, increased risk factor based on prior history and current smoking.  Initial troponin elevated.  Delta troponin required for evaluation.  No acute EKG abnormalities requiring immediate cardiology intervention.  Likely consult cardiology after return of second troponin and arrange for disposition. ? ?Medical screening examination/treatment/procedure(s) were conducted as a shared visit with non-physician practitioner(s) and myself.  I personally evaluated the patient during the encounter ? ?  ?Daleen Bo, MD ?12/03/21 2108 ? ?

## 2021-12-03 ENCOUNTER — Encounter (HOSPITAL_COMMUNITY): Payer: Self-pay | Admitting: Student in an Organized Health Care Education/Training Program

## 2021-12-03 ENCOUNTER — Other Ambulatory Visit: Payer: Self-pay

## 2021-12-03 ENCOUNTER — Inpatient Hospital Stay (HOSPITAL_COMMUNITY): Payer: Managed Care, Other (non HMO)

## 2021-12-03 DIAGNOSIS — I248 Other forms of acute ischemic heart disease: Secondary | ICD-10-CM

## 2021-12-03 DIAGNOSIS — I214 Non-ST elevation (NSTEMI) myocardial infarction: Principal | ICD-10-CM

## 2021-12-03 LAB — ECHOCARDIOGRAM COMPLETE
Area-P 1/2: 2.39 cm2
Height: 64 in
S' Lateral: 1.5 cm
Weight: 2336 oz

## 2021-12-03 LAB — CBC
HCT: 40.2 % (ref 36.0–46.0)
HCT: 40.3 % (ref 36.0–46.0)
Hemoglobin: 13.5 g/dL (ref 12.0–15.0)
Hemoglobin: 13.8 g/dL (ref 12.0–15.0)
MCH: 31 pg (ref 26.0–34.0)
MCH: 31.3 pg (ref 26.0–34.0)
MCHC: 33.6 g/dL (ref 30.0–36.0)
MCHC: 34.2 g/dL (ref 30.0–36.0)
MCV: 91.4 fL (ref 80.0–100.0)
MCV: 92.4 fL (ref 80.0–100.0)
Platelets: 235 10*3/uL (ref 150–400)
Platelets: 245 10*3/uL (ref 150–400)
RBC: 4.35 MIL/uL (ref 3.87–5.11)
RBC: 4.41 MIL/uL (ref 3.87–5.11)
RDW: 13 % (ref 11.5–15.5)
RDW: 13 % (ref 11.5–15.5)
WBC: 10.7 10*3/uL — ABNORMAL HIGH (ref 4.0–10.5)
WBC: 8.1 10*3/uL (ref 4.0–10.5)
nRBC: 0 % (ref 0.0–0.2)
nRBC: 0 % (ref 0.0–0.2)

## 2021-12-03 LAB — HEPARIN LEVEL (UNFRACTIONATED)
Heparin Unfractionated: 0.18 IU/mL — ABNORMAL LOW (ref 0.30–0.70)
Heparin Unfractionated: 0.63 IU/mL (ref 0.30–0.70)
Heparin Unfractionated: 0.46 IU/mL (ref 0.30–0.70)
Heparin Unfractionated: 0.34 IU/mL (ref 0.30–0.70)

## 2021-12-03 LAB — SURGICAL PCR SCREEN
MRSA, PCR: NEGATIVE
Staphylococcus aureus: NEGATIVE

## 2021-12-03 MED ORDER — ALUM & MAG HYDROXIDE-SIMETH 200-200-20 MG/5ML PO SUSP
15.0000 mL | ORAL | Status: DC | PRN
  Administered 2021-12-03: 15 mL via ORAL
  Filled 2021-12-03: qty 30

## 2021-12-03 MED ORDER — POTASSIUM CHLORIDE CRYS ER 20 MEQ PO TBCR
40.0000 meq | EXTENDED_RELEASE_TABLET | Freq: Once | ORAL | Status: AC
  Administered 2021-12-03: 40 meq via ORAL
  Filled 2021-12-03: qty 2

## 2021-12-03 MED ORDER — PNEUMOCOCCAL 20-VAL CONJ VACC 0.5 ML IM SUSY
0.5000 mL | PREFILLED_SYRINGE | INTRAMUSCULAR | Status: DC | PRN
  Filled 2021-12-03: qty 0.5

## 2021-12-03 MED ORDER — HEPARIN BOLUS VIA INFUSION
2000.0000 [IU] | Freq: Once | INTRAVENOUS | Status: AC
Start: 2021-12-03 — End: 2021-12-03
  Administered 2021-12-03: 2000 [IU] via INTRAVENOUS
  Filled 2021-12-03: qty 2000

## 2021-12-03 NOTE — Progress Notes (Signed)
ANTICOAGULATION CONSULT NOTE- follow-up ?Pharmacy Consult for heparin ?Indication: chest pain/ACS ?Brief A/P: Heparin level subtherapeutic Increase Heparin rate ? ?Allergies  ?Allergen Reactions  ? Penicillins Hives and Rash  ?  Has patient had a PCN reaction causing immediate rash, facial/tongue/throat swelling, SOB or lightheadedness with hypotension: Yes ?Has patient had a PCN reaction causing severe rash involving mucus membranes or skin necrosis: No ?Has patient had a PCN reaction that required hospitalization No ?Has patient had a PCN reaction occurring within the last 10 years: No ?If all of the above answers are "NO", then may proceed with Cephalosporin use. ?  ? Cephalexin   ? Clarithromycin   ? Levofloxacin   ? ? ?Patient Measurements: ?Height: '5\' 4"'$  (162.6 cm) ?Weight: 66.2 kg (146 lb) ?IBW/kg (Calculated) : 54.7 ?HEPARIN DW (KG): 65.8 ? ? ?Vital Signs: ?Temp: 98.3 ?F (36.8 ?C) (04/09 1703) ?Temp Source: Oral (04/09 1703) ?BP: 124/68 (04/09 1703) ?Pulse Rate: 63 (04/09 1703) ? ?Labs: ?Recent Labs  ?  12/02/21 ?1155 12/03/21 ?0031 12/03/21 ?1194 12/03/21 ?1740 12/03/21 ?1515  ?HGB 14.0  --  13.8  --   --   ?HCT 42.0  --  40.3  --   --   ?PLT 248  --  235  --   --   ?HEPARINUNFRC  --  0.18*  --  0.46 0.34  ?CREATININE 0.93  --   --   --   --   ? ? ? ?Estimated Creatinine Clearance: 58.7 mL/min (by C-G formula based on SCr of 0.93 mg/dL). ? ? ?Assessment: ?61 y.o. female with chest pain for heparin. ? ?Confirmatory heparin level is therapeutic. ? ?Goal of Therapy:  ?Heparin level 0.3-0.7 units/ml ?Monitor platelets by anticoagulation protocol: Yes ?  ?Plan:  ?Continue heparin @ 1000 units/hr ?Monitor for S/S bleeding ?Heparin level and CBC daily with AM labs ? ?Arrie Senate, PharmD, BCPS, BCCP ?Clinical Pharmacist ?[phone] ?Please check AMION for all [place] numbers ?12/03/2021 ? ?

## 2021-12-03 NOTE — Progress Notes (Signed)
CHG completed ? ?

## 2021-12-03 NOTE — Progress Notes (Signed)
ANTICOAGULATION CONSULT NOTE- follow-up ?Pharmacy Consult for heparin ?Indication: chest pain/ACS ?Brief A/P: Heparin level subtherapeutic Increase Heparin rate ? ?Allergies  ?Allergen Reactions  ? Penicillins Hives and Rash  ?  Has patient had a PCN reaction causing immediate rash, facial/tongue/throat swelling, SOB or lightheadedness with hypotension: Yes ?Has patient had a PCN reaction causing severe rash involving mucus membranes or skin necrosis: No ?Has patient had a PCN reaction that required hospitalization No ?Has patient had a PCN reaction occurring within the last 10 years: No ?If all of the above answers are "NO", then may proceed with Cephalosporin use. ?  ? Cephalexin   ? Clarithromycin   ? Levofloxacin   ? ? ?Patient Measurements: ?Height: '5\' 4"'$  (162.6 cm) ?Weight: 66.2 kg (146 lb) ?IBW/kg (Calculated) : 54.7 ?HEPARIN DW (KG): 65.8 ? ? ?Vital Signs: ?Temp: 97.7 ?F (36.5 ?C) (04/09 0516) ?Temp Source: Oral (04/09 0516) ?BP: 124/64 (04/09 0516) ?Pulse Rate: 65 (04/09 0516) ? ?Labs: ?Recent Labs  ?  12/02/21 ?1155 12/03/21 ?0031 12/03/21 ?2774 12/03/21 ?1287  ?HGB 14.0  --  13.8  --   ?HCT 42.0  --  40.3  --   ?PLT 248  --  235  --   ?HEPARINUNFRC  --  0.18*  --  0.46  ?CREATININE 0.93  --   --   --   ? ? ? ?Estimated Creatinine Clearance: 58.7 mL/min (by C-G formula based on SCr of 0.93 mg/dL). ? ? ?Assessment: ?61 y.o. female with chest pain for heparin  ? ?Goal of Therapy:  ?Heparin level 0.3-0.7 units/ml ?Monitor platelets by anticoagulation protocol: Yes ?  ?Plan:  ?Continue heparin @ 1000 units/hr ?Recheck heparin level in 6 hours ?Monitor for S/S bleeding ?Heparin level and CBC daily with AM labs ? ?Nycholas Rayner BS, PharmD, BCPS ?Clinical Pharmacist ?12/03/2021 9:47 AM ?

## 2021-12-03 NOTE — H&P (View-Only) (Signed)
? ? ?Subjective:  ?Denies SSCP, palpitations or Dyspnea ?Discussed cath with her  ? ?Objective:  ?Vitals:  ? 12/02/21 1945 12/02/21 2000 12/03/21 0018 12/03/21 0516  ?BP: (!) 128/58 (!) 166/84 (!) 96/50 124/64  ?Pulse: 63 (!) 57 70 65  ?Resp: '17 13 17 18  '$ ?Temp:   97.8 ?F (36.6 ?C) 97.7 ?F (36.5 ?C)  ?TempSrc:   Oral Oral  ?SpO2: 98% 99% 98% 98%  ?Weight:   66.2 kg   ?Height:      ? ? ?Intake/Output from previous day: ? ?Intake/Output Summary (Last 24 hours) at 12/03/2021 0707 ?Last data filed at 12/03/2021 0300 ?Gross per 24 hour  ?Intake 522.12 ml  ?Output 200 ml  ?Net 322.12 ml  ? ? ?Physical Exam: ?Affect appropriate ?Healthy:  appears stated age ?HEENT: normal ?Neck supple with no adenopathy ?JVP normal no bruits no thyromegaly ?Lungs clear with no wheezing and good diaphragmatic motion ?Heart:  S1/S2 no murmur, no rub, gallop or click ?PMI normal ?Abdomen: benighn, BS positve, no tenderness, no AAA ?no bruit.  No HSM or HJR ?Distal pulses intact with no bruits ?No edema ?Neuro non-focal ?Skin warm and dry ?No muscular weakness ? ? ?Lab Results: ?Basic Metabolic Panel: ?Recent Labs  ?  12/02/21 ?1155  ?NA 140  ?K 3.6  ?CL 108  ?CO2 24  ?GLUCOSE 135*  ?BUN 15  ?CREATININE 0.93  ?CALCIUM 9.2  ? ?Liver Function Tests: ?No results for input(s): AST, ALT, ALKPHOS, BILITOT, PROT, ALBUMIN in the last 72 hours. ?No results for input(s): LIPASE, AMYLASE in the last 72 hours. ?CBC: ?Recent Labs  ?  12/02/21 ?1155 12/03/21 ?0109  ?WBC 8.2 8.1  ?HGB 14.0 13.8  ?HCT 42.0 40.3  ?MCV 91.5 91.4  ?PLT 248 235  ? ?Cardiac Enzymes: ?No results for input(s): CKTOTAL, CKMB, CKMBINDEX, TROPONINI in the last 72 hours. ?BNP: ?Invalid input(s): POCBNP ?D-Dimer: ?No results for input(s): DDIMER in the last 72 hours. ?Hemoglobin A1C: ?Recent Labs  ?  12/02/21 ?2102  ?HGBA1C 5.8*  ? ?Fasting Lipid Panel: ?Recent Labs  ?  12/02/21 ?2102  ?CHOL 192  ?HDL 39*  ?LDLCALC 110*  ?TRIG 214*  ?CHOLHDL 4.9  ? ?Thyroid Function Tests: ?Recent Labs  ?   12/02/21 ?2102  ?TSH 7.920*  ? ?Anemia Panel: ?No results for input(s): VITAMINB12, FOLATE, FERRITIN, TIBC, IRON, RETICCTPCT in the last 72 hours. ? ?Imaging: ?DG Chest 2 View ? ?Result Date: 12/02/2021 ?CLINICAL DATA:  Chest pain.  Left arm pain for the last week EXAM: CHEST - 2 VIEW COMPARISON:  01/24/2012 FINDINGS: The heart size and mediastinal contours are within normal limits. Both lungs are clear. The visualized skeletal structures are unremarkable. IMPRESSION: No active cardiopulmonary disease. Electronically Signed   By: Jorje Guild M.D.   On: 12/02/2021 12:11   ? ?Cardiac Studies: ? ECG: NSR no acute changes  ? ? Telemetry:  NSR 12/03/2021  ? Echo: pending ? ?Medications: ?  ? [START ON 12/04/2021] aspirin  81 mg Oral Pre-Cath  ? aspirin EC  81 mg Oral Daily  ? atorvastatin  80 mg Oral Daily  ? ciprofloxacin  500 mg Oral BID  ? nicotine  14 mg Transdermal Daily  ? sodium chloride flush  3 mL Intravenous Q12H  ? ?  ? sodium chloride    ? [START ON 12/04/2021] sodium chloride    ? Followed by  ? [START ON 12/04/2021] sodium chloride    ? heparin 1,000 Units/hr (12/03/21 0132)  ? ? ?Assessment/Plan:  ? ?  SSCP:  new onset worrisome for angina small bump in troponin 83 on heparin for cath Monday orders written  ?HLD:  high dose statin started  ?Smoking:  nicotine patch  ?Thyroid:  f/u primary TSH mildly elevated consider low dose synthroid  ?Menopause:  holding estradiol in setting of suspected NSTEMI ? ?Margaret Dixon ?12/03/2021, 7:07 AM ? ? ? ?

## 2021-12-03 NOTE — Progress Notes (Signed)
ANTICOAGULATION CONSULT NOTE ?Pharmacy Consult for heparin ?Indication: chest pain/ACS ?Brief A/P: Heparin level subtherapeutic Increase Heparin rate ? ?Allergies  ?Allergen Reactions  ? Penicillins Hives and Rash  ?  Has patient had a PCN reaction causing immediate rash, facial/tongue/throat swelling, SOB or lightheadedness with hypotension: Yes ?Has patient had a PCN reaction causing severe rash involving mucus membranes or skin necrosis: No ?Has patient had a PCN reaction that required hospitalization No ?Has patient had a PCN reaction occurring within the last 10 years: No ?If all of the above answers are "NO", then may proceed with Cephalosporin use. ?  ? Cephalexin   ? Clarithromycin   ? Levofloxacin   ? ? ?Patient Measurements: ?Height: '5\' 4"'$  (162.6 cm) ?Weight: 66.2 kg (146 lb) ?IBW/kg (Calculated) : 54.7 ?Heparin Dosing Weight: HEPARIN DW (KG): 65.8 ? ? ?Vital Signs: ?Temp: 97.8 ?F (36.6 ?C) (04/09 0018) ?Temp Source: Oral (04/09 0018) ?BP: 96/50 (04/09 0018) ?Pulse Rate: 70 (04/09 0018) ? ?Labs: ?Recent Labs  ?  12/02/21 ?1155 12/03/21 ?0031  ?HGB 14.0  --   ?HCT 42.0  --   ?PLT 248  --   ?HEPARINUNFRC  --  0.18*  ?CREATININE 0.93  --   ? ? ? ?Estimated Creatinine Clearance: 58.7 mL/min (by C-G formula based on SCr of 0.93 mg/dL). ? ? ?Assessment: ?61 y.o. female with chest pain for heparin  ? ?Goal of Therapy:  ?Heparin level 0.3-0.7 units/ml ?Monitor platelets by anticoagulation protocol: Yes ?  ?Plan:  ?Heparin 2000 units IV bolus, then increase heparin  1000 units/hr ?Check heparin level in 6 hours.  ? ?Estalene Bergey, Bronson Curb ?12/03/2021,1:18 AM ?  ?

## 2021-12-03 NOTE — Progress Notes (Signed)
? ? ?Subjective:  ?Denies SSCP, palpitations or Dyspnea ?Discussed cath with her  ? ?Objective:  ?Vitals:  ? 12/02/21 1945 12/02/21 2000 12/03/21 0018 12/03/21 0516  ?BP: (!) 128/58 (!) 166/84 (!) 96/50 124/64  ?Pulse: 63 (!) 57 70 65  ?Resp: '17 13 17 18  '$ ?Temp:   97.8 ?F (36.6 ?C) 97.7 ?F (36.5 ?C)  ?TempSrc:   Oral Oral  ?SpO2: 98% 99% 98% 98%  ?Weight:   66.2 kg   ?Height:      ? ? ?Intake/Output from previous day: ? ?Intake/Output Summary (Last 24 hours) at 12/03/2021 0707 ?Last data filed at 12/03/2021 0300 ?Gross per 24 hour  ?Intake 522.12 ml  ?Output 200 ml  ?Net 322.12 ml  ? ? ?Physical Exam: ?Affect appropriate ?Healthy:  appears stated age ?HEENT: normal ?Neck supple with no adenopathy ?JVP normal no bruits no thyromegaly ?Lungs clear with no wheezing and good diaphragmatic motion ?Heart:  S1/S2 no murmur, no rub, gallop or click ?PMI normal ?Abdomen: benighn, BS positve, no tenderness, no AAA ?no bruit.  No HSM or HJR ?Distal pulses intact with no bruits ?No edema ?Neuro non-focal ?Skin warm and dry ?No muscular weakness ? ? ?Lab Results: ?Basic Metabolic Panel: ?Recent Labs  ?  12/02/21 ?1155  ?NA 140  ?K 3.6  ?CL 108  ?CO2 24  ?GLUCOSE 135*  ?BUN 15  ?CREATININE 0.93  ?CALCIUM 9.2  ? ?Liver Function Tests: ?No results for input(s): AST, ALT, ALKPHOS, BILITOT, PROT, ALBUMIN in the last 72 hours. ?No results for input(s): LIPASE, AMYLASE in the last 72 hours. ?CBC: ?Recent Labs  ?  12/02/21 ?1155 12/03/21 ?1610  ?WBC 8.2 8.1  ?HGB 14.0 13.8  ?HCT 42.0 40.3  ?MCV 91.5 91.4  ?PLT 248 235  ? ?Cardiac Enzymes: ?No results for input(s): CKTOTAL, CKMB, CKMBINDEX, TROPONINI in the last 72 hours. ?BNP: ?Invalid input(s): POCBNP ?D-Dimer: ?No results for input(s): DDIMER in the last 72 hours. ?Hemoglobin A1C: ?Recent Labs  ?  12/02/21 ?2102  ?HGBA1C 5.8*  ? ?Fasting Lipid Panel: ?Recent Labs  ?  12/02/21 ?2102  ?CHOL 192  ?HDL 39*  ?LDLCALC 110*  ?TRIG 214*  ?CHOLHDL 4.9  ? ?Thyroid Function Tests: ?Recent Labs  ?   12/02/21 ?2102  ?TSH 7.920*  ? ?Anemia Panel: ?No results for input(s): VITAMINB12, FOLATE, FERRITIN, TIBC, IRON, RETICCTPCT in the last 72 hours. ? ?Imaging: ?DG Chest 2 View ? ?Result Date: 12/02/2021 ?CLINICAL DATA:  Chest pain.  Left arm pain for the last week EXAM: CHEST - 2 VIEW COMPARISON:  01/24/2012 FINDINGS: The heart size and mediastinal contours are within normal limits. Both lungs are clear. The visualized skeletal structures are unremarkable. IMPRESSION: No active cardiopulmonary disease. Electronically Signed   By: Jorje Guild M.D.   On: 12/02/2021 12:11   ? ?Cardiac Studies: ? ECG: NSR no acute changes  ? ? Telemetry:  NSR 12/03/2021  ? Echo: pending ? ?Medications: ?  ? [START ON 12/04/2021] aspirin  81 mg Oral Pre-Cath  ? aspirin EC  81 mg Oral Daily  ? atorvastatin  80 mg Oral Daily  ? ciprofloxacin  500 mg Oral BID  ? nicotine  14 mg Transdermal Daily  ? sodium chloride flush  3 mL Intravenous Q12H  ? ?  ? sodium chloride    ? [START ON 12/04/2021] sodium chloride    ? Followed by  ? [START ON 12/04/2021] sodium chloride    ? heparin 1,000 Units/hr (12/03/21 0132)  ? ? ?Assessment/Plan:  ? ?  SSCP:  new onset worrisome for angina small bump in troponin 83 on heparin for cath Monday orders written  ?HLD:  high dose statin started  ?Smoking:  nicotine patch  ?Thyroid:  f/u primary TSH mildly elevated consider low dose synthroid  ?Menopause:  holding estradiol in setting of suspected NSTEMI ? ?Jenkins Rouge ?12/03/2021, 7:07 AM ? ? ? ?

## 2021-12-04 ENCOUNTER — Encounter (HOSPITAL_COMMUNITY)
Admission: EM | Disposition: A | Discharge status: Home / Self Care | Payer: Self-pay | Source: Home / Self Care | Attending: Cardiovascular Disease

## 2021-12-04 ENCOUNTER — Other Ambulatory Visit (HOSPITAL_COMMUNITY): Payer: Self-pay

## 2021-12-04 ENCOUNTER — Encounter (HOSPITAL_COMMUNITY): Payer: Self-pay | Admitting: Cardiology

## 2021-12-04 DIAGNOSIS — I249 Acute ischemic heart disease, unspecified: Secondary | ICD-10-CM

## 2021-12-04 DIAGNOSIS — N951 Menopausal and female climacteric states: Secondary | ICD-10-CM

## 2021-12-04 DIAGNOSIS — I2510 Atherosclerotic heart disease of native coronary artery without angina pectoris: Secondary | ICD-10-CM

## 2021-12-04 DIAGNOSIS — E079 Disorder of thyroid, unspecified: Secondary | ICD-10-CM

## 2021-12-04 DIAGNOSIS — Z780 Asymptomatic menopausal state: Secondary | ICD-10-CM

## 2021-12-04 HISTORY — PX: CORONARY STENT INTERVENTION: CATH118234

## 2021-12-04 HISTORY — PX: LEFT HEART CATH AND CORONARY ANGIOGRAPHY: CATH118249

## 2021-12-04 LAB — CBC
HCT: 42.1 % (ref 36.0–46.0)
Hemoglobin: 14.4 g/dL (ref 12.0–15.0)
MCH: 31 pg (ref 26.0–34.0)
MCHC: 34.2 g/dL (ref 30.0–36.0)
MCV: 90.7 fL (ref 80.0–100.0)
Platelets: 243 10*3/uL (ref 150–400)
RBC: 4.64 MIL/uL (ref 3.87–5.11)
RDW: 13 % (ref 11.5–15.5)
WBC: 8.4 10*3/uL (ref 4.0–10.5)
nRBC: 0 % (ref 0.0–0.2)

## 2021-12-04 LAB — HEPARIN LEVEL (UNFRACTIONATED): Heparin Unfractionated: 0.43 IU/mL (ref 0.30–0.70)

## 2021-12-04 LAB — POCT ACTIVATED CLOTTING TIME: Activated Clotting Time: 306 seconds

## 2021-12-04 SURGERY — LEFT HEART CATH AND CORONARY ANGIOGRAPHY
Anesthesia: LOCAL

## 2021-12-04 MED ORDER — NITROGLYCERIN 0.4 MG SL SUBL: 0.4000 mg | SUBLINGUAL_TABLET | SUBLINGUAL | 1 refills | Status: DC | PRN

## 2021-12-04 MED ORDER — HEPARIN SODIUM (PORCINE) 1000 UNIT/ML IJ SOLN
INTRAMUSCULAR | Status: AC
  Filled 2021-12-04: qty 10

## 2021-12-04 MED ORDER — LIDOCAINE HCL (PF) 1 % IJ SOLN
INTRAMUSCULAR | Status: AC
  Filled 2021-12-04: qty 30

## 2021-12-04 MED ORDER — ATORVASTATIN CALCIUM 80 MG PO TABS: 80.0000 mg | ORAL_TABLET | Freq: Every day | ORAL | 3 refills | Status: DC

## 2021-12-04 MED ORDER — ASPIRIN 81 MG PO CHEW: 81.0000 mg | CHEWABLE_TABLET | Freq: Every day | ORAL | Status: DC

## 2021-12-04 MED ORDER — TICAGRELOR 90 MG PO TABS: 90.0000 mg | ORAL_TABLET | Freq: Two times a day (BID) | ORAL | 3 refills | Status: DC

## 2021-12-04 MED ORDER — SODIUM CHLORIDE 0.9% FLUSH: 3.0000 mL | Freq: Two times a day (BID) | INTRAVENOUS | Status: DC

## 2021-12-04 MED ORDER — FENTANYL CITRATE (PF) 100 MCG/2ML IJ SOLN
INTRAMUSCULAR | Status: AC
  Filled 2021-12-04: qty 2

## 2021-12-04 MED ORDER — VERAPAMIL HCL 2.5 MG/ML IV SOLN
INTRAVENOUS | Status: AC
  Filled 2021-12-04: qty 2

## 2021-12-04 MED ORDER — SODIUM CHLORIDE 0.9% FLUSH: 3.0000 mL | INTRAVENOUS | Status: DC | PRN

## 2021-12-04 MED ORDER — TICAGRELOR 90 MG PO TABS
ORAL_TABLET | ORAL | Status: AC
  Filled 2021-12-04: qty 1

## 2021-12-04 MED ORDER — LIDOCAINE HCL (PF) 1 % IJ SOLN
INTRAMUSCULAR | Status: DC | PRN
  Administered 2021-12-04: 2 mL

## 2021-12-04 MED ORDER — HEPARIN (PORCINE) IN NACL 1000-0.9 UT/500ML-% IV SOLN
INTRAVENOUS | Status: AC
  Filled 2021-12-04: qty 1000

## 2021-12-04 MED ORDER — IOHEXOL 350 MG/ML SOLN
INTRAVENOUS | Status: DC | PRN
  Administered 2021-12-04: 85 mL

## 2021-12-04 MED ORDER — TICAGRELOR 90 MG PO TABS
ORAL_TABLET | ORAL | Status: DC | PRN
  Administered 2021-12-04: 180 mg via ORAL

## 2021-12-04 MED ORDER — HEPARIN (PORCINE) IN NACL 1000-0.9 UT/500ML-% IV SOLN
INTRAVENOUS | Status: DC | PRN
  Administered 2021-12-04 (×2): 500 mL

## 2021-12-04 MED ORDER — SODIUM CHLORIDE 0.9 % WEIGHT BASED INFUSION
1.0000 mL/kg/h | INTRAVENOUS | Status: AC
  Administered 2021-12-04 (×2): 1 mL/kg/h via INTRAVENOUS

## 2021-12-04 MED ORDER — MIDAZOLAM HCL 2 MG/2ML IJ SOLN
INTRAMUSCULAR | Status: AC
  Filled 2021-12-04: qty 2

## 2021-12-04 MED ORDER — NITROGLYCERIN 1 MG/10 ML FOR IR/CATH LAB
INTRA_ARTERIAL | Status: DC | PRN
  Administered 2021-12-04: 200 ug via INTRACORONARY

## 2021-12-04 MED ORDER — VERAPAMIL HCL 2.5 MG/ML IV SOLN
INTRAVENOUS | Status: DC | PRN
  Administered 2021-12-04: 10 mL via INTRA_ARTERIAL

## 2021-12-04 MED ORDER — HYDRALAZINE HCL 20 MG/ML IJ SOLN: 10.0000 mg | INTRAMUSCULAR | Status: AC | PRN

## 2021-12-04 MED ORDER — MIDAZOLAM HCL 2 MG/2ML IJ SOLN
INTRAMUSCULAR | Status: DC | PRN
Start: 2021-12-04 — End: 2021-12-04
  Administered 2021-12-04: 2 mg via INTRAVENOUS

## 2021-12-04 MED ORDER — SODIUM CHLORIDE 0.9 % IV SOLN: 250.0000 mL | INTRAVENOUS | Status: DC | PRN

## 2021-12-04 MED ORDER — ASPIRIN 81 MG PO CHEW: 81.0000 mg | CHEWABLE_TABLET | Freq: Every day | ORAL | 3 refills | Status: DC

## 2021-12-04 MED ORDER — TICAGRELOR 90 MG PO TABS
90.0000 mg | ORAL_TABLET | Freq: Two times a day (BID) | ORAL | Status: DC
  Administered 2021-12-04: 90 mg via ORAL
  Filled 2021-12-04: qty 1

## 2021-12-04 MED ORDER — HEPARIN SODIUM (PORCINE) 1000 UNIT/ML IJ SOLN
INTRAMUSCULAR | Status: DC | PRN
  Administered 2021-12-04 (×2): 3500 [IU] via INTRAVENOUS

## 2021-12-04 MED ORDER — FENTANYL CITRATE (PF) 100 MCG/2ML IJ SOLN
INTRAMUSCULAR | Status: DC | PRN
  Administered 2021-12-04: 25 ug via INTRAVENOUS

## 2021-12-04 MED ORDER — NITROGLYCERIN 1 MG/10 ML FOR IR/CATH LAB
INTRA_ARTERIAL | Status: AC
  Filled 2021-12-04: qty 10

## 2021-12-04 SURGICAL SUPPLY — 18 items
BALL SAPPHIRE NC24 2.50X8 (BALLOONS) ×2
BALLN SAPPHIRE 2.0X12 (BALLOONS) ×2
BALLOON SAPPHIRE 2.0X12 (BALLOONS) IMPLANT
BALLOON SAPPHIRE NC24 2.50X8 (BALLOONS) IMPLANT
CATH 5FR JR4 DIAGNOSTIC (CATHETERS) ×1 IMPLANT
CATH JL3.5 FR DIAG (CATHETERS) ×1 IMPLANT
CATH LAUNCHER 5F EBU3.5 (CATHETERS) ×1 IMPLANT
CATH LAUNCHER 6FR EBU 3 (CATHETERS) ×1 IMPLANT
GLIDESHEATH SLEND SS 6F .021 (SHEATH) ×1 IMPLANT
GUIDEWIRE INQWIRE 1.5J.035X260 (WIRE) IMPLANT
INQWIRE 1.5J .035X260CM (WIRE) ×2
KIT ENCORE 26 ADVANTAGE (KITS) ×1 IMPLANT
KIT HEART LEFT (KITS) ×2 IMPLANT
PACK CARDIAC CATHETERIZATION (CUSTOM PROCEDURE TRAY) ×2 IMPLANT
STENT ONYX FRONTIER 2.5X15 (Permanent Stent) ×1 IMPLANT
TRANSDUCER W/STOPCOCK (MISCELLANEOUS) ×2 IMPLANT
TUBING CIL FLEX 10 FLL-RA (TUBING) ×2 IMPLANT
WIRE ASAHI PROWATER 180CM (WIRE) ×1 IMPLANT

## 2021-12-04 NOTE — Discharge Summary (Signed)
?Discharge Summary  ?  ?Patient ID: Margaret Dixon ?MRN: [id number]; DOB: [birth year] ? ?Admit date: 12/02/2021 ?Discharge date: 12/04/2021 ? ?PCP:  Doyle Askew, PA-C ?  ?[facility] Providers ?Cardiologist:  remotely seen by Dr. Percival Spanish ? ?Discharge Diagnoses  ?  ?Principal Problem: ?  ACS (acute coronary syndrome) ([place]) ?Active Problems: ?  CAD (coronary artery disease) ?  Tobacco abuse ?  Hyperlipidemia ?  NSTEMI (non-ST elevated myocardial infarction) ([place]) ?  Menopause ?  Thyroid dysfunction ? ? ?Diagnostic Studies/Procedures  ?  ?Cardiac catheterization 12/04/21 ?  1st Diag lesion is 60% stenosed. ?  1st Mrg lesion is 50% stenosed. ?  Mid Cx lesion is 90% stenosed. ?  A drug-eluting stent was successfully placed using a STENT ONYX FRONTIER 2.5X15. ?  Post intervention, there is a 0% residual stenosis. ?  LV end diastolic pressure is normal. ?  ?Single vessel obstructive CAD involving the mid LCx ?Normal LVEDP ?Successful PCI of the mid LCx with DES x 1 ?Coronary Diagrams ? ?Diagnostic ?Dominance: Right ?Intervention ? ? ? ? ?  ?Plan: DAPT for one year. Anticipate same day DC after 6 hours ? ?Antiplatelet/Anticoag Recommend uninterrupted dual antiplatelet therapy with Aspirin '81mg'$  daily and Ticagrelor '90mg'$  twice daily for a minimum of 12 months (ACS-Class I recommendation).  ?Discharge Date In the absence of any other complications or medical issues, we expect the patient to be ready for discharge from an interventional cardiology perspective on 12/04/2021.  ?  ? ?Echo 12/03/21 ? 1. Left ventricular ejection fraction, by estimation, is 60 to 65%. The  ?left ventricle has normal function. The left ventricle has no regional  ?wall motion abnormalities. There is mild left ventricular hypertrophy.  ?Left ventricular diastolic parameters  ?are indeterminate.  ? 2. Right ventricular systolic function is normal. The right ventricular  ?size is normal. There is normal pulmonary artery systolic pressure.  ? 3. The  mitral valve is grossly normal. No evidence of mitral valve  ?regurgitation.  ? 4. The aortic valve is grossly normal. Aortic valve regurgitation is not  ?visualized.  ? 5. The inferior vena cava is normal in size with greater than 50%  ?respiratory variability, suggesting right atrial pressure of 3 mmHg.  ? ?Comparison(s): No prior Echocardiogram.  ? ?_____________ ?  ?History of Present Illness   ?  ?Margaret Dixon is a 61 y.o. female with CAD, prior tobacco use, MDD, fibromyalgia who presented for chest pain. The patient was brought by EMS for chest pain with radiation to the arm for the last week with associated nausea.  ? ?Hospital Course  ?   ?Consultants: None ? ?On arrival vitals were stable. HS troponin was elevated to 83. She was started on IV heparin and admitted for cardiac catheterization. Home estradial was held on admission. She was also started on Lipitor '80mg'$  daily, LDL came back at 110. Echo showed LVEF 60-65%, no WMA, mild LVH, normal RV function. Cardiac cath showed single vessel obstructive CAD in the mid Lcx, normal LVEDP. The patient was treated with DESx1. She was started on Aspirin and Bilinta, plan to continue DAPT for 12 months. Plan for discharge 6 hours post-cat if stable. TSH elevated to 7.9, recommend PCP follow-up. Tobacco cessation encouraged. I will send in Aspirin '81mg'$  daily, Brilnita '90mg'$ BID, and Lipitor 8mdaily. ? ?The patient was evaluated by Dr. RHarrington Challengerpost-cath 12/04/21 and was felt to be stable for discharge.  ? ?Did the patient have an acute coronary syndrome (MI,  NSTEMI, STEMI, etc) this admission?:  Yes                              ? ?AHA/ACC Clinical Performance & Quality Measures: ?Aspirin prescribed? - Yes ?ADP Receptor Inhibitor (Plavix/Clopidogrel, Brilinta/Ticagrelor or Effient/Prasugrel) prescribed (includes medically managed patients)? - Yes ?Beta Blocker prescribed? - No - can address as OP ?High Intensity Statin (Lipitor 40-'80mg'$  or Crestor 20-'40mg'$ ) prescribed?  - Yes ?EF assessed during THIS hospitalization? - Yes ?For EF <40%, was ACEI/ARB prescribed? - Not Applicable (EF >/= 15%) ?For EF <40%, Aldosterone Antagonist (Spironolactone or Eplerenone) prescribed? - Not Applicable (EF >/= 40%) ?Cardiac Rehab Phase II ordered (including medically managed patients)? - Yes  ? ?   ? ?The patient will be scheduled for a TOC follow up appointment in 14 days.  A message has been sent to the [facility] and Scheduling Pool at the office where the patient should be seen for follow up.  ?_____________ ? ?Discharge Vitals ?Blood pressure 120/74, pulse 80, temperature 97.6 ?F (36.4 ?C), temperature source Oral, resp. rate 20, height '5\' 4"'$  (1.626 m), weight 65.9 kg, SpO2 99 %.  ?Filed Weights  ? 12/02/21 1944 12/03/21 0018 12/04/21 0456  ?Weight: 66.1 kg 66.2 kg 65.9 kg  ? ? ?Labs & Radiologic Studies  ?  ?CBC ?Recent Labs  ?  12/03/21 ?0867 12/04/21 ?0248  ?WBC 8.1 8.4  ?HGB 13.8 14.4  ?HCT 40.3 42.1  ?MCV 91.4 90.7  ?PLT 235 243  ? ?Basic Metabolic Panel ?Recent Labs  ?  12/02/21 ?1155  ?NA 140  ?K 3.6  ?CL 108  ?CO2 24  ?GLUCOSE 135*  ?BUN 15  ?CREATININE 0.93  ?CALCIUM 9.2  ? ?Liver Function Tests ?No results for input(s): AST, ALT, ALKPHOS, BILITOT, PROT, ALBUMIN in the last 72 hours. ?No results for input(s): LIPASE, AMYLASE in the last 72 hours. ?High Sensitivity Troponin:   ?Recent Labs  ?Lab 12/02/21 ?1155 12/02/21 ?1406  ?TROPONINIHS 24* 83*  ?  ?BNP ?Invalid input(s): POCBNP ?D-Dimer ?No results for input(s): DDIMER in the last 72 hours. ?Hemoglobin A1C ?Recent Labs  ?  12/02/21 ?2102  ?HGBA1C 5.8*  ? ?Fasting Lipid Panel ?Recent Labs  ?  12/02/21 ?2102  ?CHOL 192  ?HDL 39*  ?LDLCALC 110*  ?TRIG 214*  ?CHOLHDL 4.9  ? ?Thyroid Function Tests ?Recent Labs  ?  12/02/21 ?2102  ?TSH 7.920*  ? ?_____________  ?DG Chest 2 View ? ?Result Date: 12/02/2021 ?CLINICAL DATA:  Chest pain.  Left arm pain for the last week EXAM: CHEST - 2 VIEW COMPARISON:  01/24/2012 FINDINGS: The heart size and  mediastinal contours are within normal limits. Both lungs are clear. The visualized skeletal structures are unremarkable. IMPRESSION: No active cardiopulmonary disease. Electronically Signed   By: Jorje Guild M.D.   On: 12/02/2021 12:11  ? ?CARDIAC CATHETERIZATION ? ?Result Date: 12/04/2021 ?  1st Diag lesion is 60% stenosed.   1st Mrg lesion is 50% stenosed.   Mid Cx lesion is 90% stenosed.   A drug-eluting stent was successfully placed using a STENT ONYX FRONTIER 2.5X15.   Post intervention, there is a 0% residual stenosis.   LV end diastolic pressure is normal. Single vessel obstructive CAD involving the mid LCx Normal LVEDP Successful PCI of the mid LCx with DES x 1 Plan: DAPT for one year. Anticipate same day DC after 6 hours  ? ?ECHOCARDIOGRAM COMPLETE ? ?Result Date: 12/03/2021 ?  ECHOCARDIOGRAM REPORT   Patient Name:   ERIONA KINCHEN Date of Exam: 12/03/2021 Medical Rec #:  [id number]          Height:       64.0 in Accession #:    [id number]         Weight:       146.0 lb Date of Birth:  [birth year]           BSA:          1.711 m? Patient Age:    61 years           BP:           124/64 mmHg Patient Gender: F                  HR:           64 bpm. Exam Location:  Inpatient Procedure: 2D Echo, Color Doppler and Cardiac Doppler Indications:    Acute Ischemic Heart Disease i24.9  History:        Patient has no prior history of Echocardiogram examinations.                 Risk Factors:Dyslipidemia.  Sonographer:    Raquel Sarna Senior RDCS Referring Phys: [id number] [place]  1. Left ventricular ejection fraction, by estimation, is 60 to 65%. The left ventricle has normal function. The left ventricle has no regional wall motion abnormalities. There is mild left ventricular hypertrophy. Left ventricular diastolic parameters are indeterminate.  2. Right ventricular systolic function is normal. The right ventricular size is normal. There is normal pulmonary artery systolic pressure.  3. The mitral valve  is grossly normal. No evidence of mitral valve regurgitation.  4. The aortic valve is grossly normal. Aortic valve regurgitation is not visualized.  5. The inferior vena cava is normal in size with gr

## 2021-12-04 NOTE — TOC Transition Note (Addendum)
Transition of Care (TOC) - CM/SW Discharge Note ? ? ?Patient Details  ?Name: Margaret Dixon ?MRN: [id number] ?Date of Birth: April 16, 1961 ? ?Transition of Care (TOC) CM/SW Contact:  ?Zenon Mayo, RN ?Phone Number: ?12/04/2021, 5:31 PM ? ? ?Clinical Narrative:    ?Patient is for dc today, s/p stent, will be on brilinta.  NCM called pharmacy to give them the coupon number it went thru for the 30 day free for patient.  NCM informed patient of this information. Her deductible is not met so copay is 423.77 per benefit check.  ? ? ?  ?  ? ? ?Patient Goals and CMS Choice ?  ?  ?  ? ?Discharge Placement ?  ?           ?  ?  ?  ?  ? ?Discharge Plan and Services ?  ?  ?           ?  ?  ?  ?  ?  ?  ?  ?  ?  ?  ? ?Social Determinants of Health (SDOH) Interventions ?  ? ? ?Readmission Risk Interventions ?   ? View : Dixon data to display.  ?  ?  ?  ? ? ? ? ? ?

## 2021-12-04 NOTE — TOC Benefit Eligibility Note (Signed)
Patient Advocate Encounter ? ?Insurance verification completed.   ? ?The patient is currently admitted and upon discharge could be taking Brilinta 90 mg. ? ?The current 30 day co-pay is, $423.77 due to a $2,000.00 deductible.  ? ?The patient is insured through [employer]   ? ? ? ?Lyndel Safe, CPhT ?Pharmacy Patient Advocate Specialist ?[facility] Patient Advocate Team ?Direct Number: [phone]  Fax: [phone] ? ? ? ? ? ?  ?

## 2021-12-04 NOTE — Progress Notes (Signed)
Discussed with pt MI, stent, restrictions, Brilinta importance, diet, smoking cessation, exercise, NTG, and CRPII. Pt very receptive. Will refer to [place]. Eager to quit smoking, plans to buy nicotine patches. ?1330-1420 ?Yves Dill CES, ACSM ?2:24 PM ?12/04/2021 ? ?

## 2021-12-04 NOTE — Progress Notes (Signed)
ANTICOAGULATION CONSULT NOTE- follow-up ?Pharmacy Consult for heparin ?Indication: chest pain/ACS ? ?Allergies  ?Allergen Reactions  ? Penicillins Hives and Rash  ?  Has patient had a PCN reaction causing immediate rash, facial/tongue/throat swelling, SOB or lightheadedness with hypotension: Yes ?Has patient had a PCN reaction causing severe rash involving mucus membranes or skin necrosis: No ?Has patient had a PCN reaction that required hospitalization No ?Has patient had a PCN reaction occurring within the last 10 years: No ?If all of the above answers are "NO", then may proceed with Cephalosporin use. ?  ? Cephalexin   ? Clarithromycin   ? Levofloxacin   ? ? ?Patient Measurements: ?Height: '5\' 4"'$  (162.6 cm) ?Weight: 65.9 kg (145 lb 4.8 oz) ?IBW/kg (Calculated) : 54.7 ?HEPARIN DW (KG): 65.8 ? ? ?Vital Signs: ?Temp: 97.6 ?F (36.4 ?C) (04/10 9767) ?Temp Source: Oral (04/10 3419) ?BP: 145/71 (04/10 0752) ?Pulse Rate: 61 (04/10 0752) ? ?Labs: ?Recent Labs  ?  12/02/21 ?1155 12/02/21 ?1155 12/03/21 ?0031 12/03/21 ?3790 12/03/21 ?2409 12/03/21 ?1515 12/04/21 ?0248  ?HGB 14.0  --  13.5 13.8  --   --  14.4  ?HCT 42.0  --  40.2 40.3  --   --  42.1  ?PLT 248  --  245 235  --   --  243  ?HEPARINUNFRC  --    < > 0.18* 0.63 0.46 0.34 0.43  ?CREATININE 0.93  --   --   --   --   --   --   ? < > = values in this interval not displayed.  ? ? ? ?Estimated Creatinine Clearance: 58.6 mL/min (by C-G formula based on SCr of 0.93 mg/dL). ? ? ?Assessment: ?61 y.o. female with chest pain, possible NSTEMI. Pharmacy consulted for heparin. No bleeding noted. CBC stable. Heparin remains therapeutic this morning. Plan is for cath this morning.  ? ? ? ?Goal of Therapy:  ?Heparin level 0.3-0.7 units/ml ?Monitor platelets by anticoagulation protocol: Yes ?  ?Plan:  ?Heparin @ 1000 units/hr, now holding for cath ?F/u post cath ? ? ?Thank you for allowing [place] to participate in this patients care. ?Jens Som, PharmD ?12/04/2021 9:19  AM ? ?**Pharmacist phone directory can be found on [email or web address] listed under [place]** ? ? ?

## 2021-12-04 NOTE — Interval H&P Note (Signed)
History and Physical Interval Note: ? ?12/04/2021 ?9:40 AM ? ?Margaret Dixon  has presented today for surgery, with the diagnosis of unstable angina.  The various methods of treatment have been discussed with the patient and family. After consideration of risks, benefits and other options for treatment, the patient has consented to  Procedure(s): ?LEFT HEART CATH AND CORONARY ANGIOGRAPHY (N/A) as a surgical intervention.  The patient's history has been reviewed, patient examined, no change in status, stable for surgery.  I have reviewed the patient's chart and labs.  Questions were answered to the patient's satisfaction.   ? ?Cath Lab Visit (complete for each Cath Lab visit) ? ?Clinical Evaluation Leading to the Procedure:  ? ?ACS: Yes.   ? ?Non-ACS:   ? ?Anginal Classification: CCS IV ? ?Anti-ischemic medical therapy: No Therapy ? ?Non-Invasive Test Results: No non-invasive testing performed ? ?Prior CABG: No previous CABG ? ? ? ? ? ? ?[name] ?12/04/2021 ?9:40 AM ? ? ? ?

## 2021-12-04 NOTE — Progress Notes (Signed)
? ? ?Subjective:  ?Breathing is OK   No CP   ? ?Objective:  ?Vitals:  ? 12/04/21 0047 12/04/21 0456 12/04/21 0731 12/04/21 0752  ?BP: 116/67 122/65 126/76 (!) 145/71  ?Pulse: 67 64 65 61  ?Resp: '17 17 18 18  '$ ?Temp: 97.9 ?F (36.6 ?C) 97.7 ?F (36.5 ?C) 97.6 ?F (36.4 ?C) 97.6 ?F (36.4 ?C)  ?TempSrc: Oral Oral Oral Oral  ?SpO2: 95% 96% 98% 99%  ?Weight:  65.9 kg    ?Height:      ? ? ?Intake/Output from previous day: ? ?Intake/Output Summary (Last 24 hours) at 12/04/2021 0820 ?Last data filed at 12/04/2021 0600 ?Gross per 24 hour  ?Intake 1498.35 ml  ?Output 2500 ml  ?Net -1001.65 ml  ? ? ?Physical Exam: ?Affect appropriate ?Healthy:  appears stated age ?HEENT: normal ?Neck supple JVP norma ?Lungs clear with no wheezing  ?Heart:  S1/S2 no murmur\PMI normal ?Abdomen: benighn, BS positve, no tenderness, no AAA ?no bruit.  No HSM or HJR ?Distal pulses intact with no bruits ?No LE edema   R wrist with pressure band   Sl bleeding  ?Neuro non-focal ?Skin warm and dry ?No muscular weakness ? ? ?Lab Results: ?Basic Metabolic Panel: ?Recent Labs  ?  12/02/21 ?1155  ?NA 140  ?K 3.6  ?CL 108  ?CO2 24  ?GLUCOSE 135*  ?BUN 15  ?CREATININE 0.93  ?CALCIUM 9.2  ? ?Liver Function Tests: ?No results for input(s): AST, ALT, ALKPHOS, BILITOT, PROT, ALBUMIN in the last 72 hours. ?No results for input(s): LIPASE, AMYLASE in the last 72 hours. ?CBC: ?Recent Labs  ?  12/03/21 ?3419 12/04/21 ?0248  ?WBC 8.1 8.4  ?HGB 13.8 14.4  ?HCT 40.3 42.1  ?MCV 91.4 90.7  ?PLT 235 243  ? ?Cardiac Enzymes: ?No results for input(s): CKTOTAL, CKMB, CKMBINDEX, TROPONINI in the last 72 hours. ?BNP: ?Invalid input(s): POCBNP ?D-Dimer: ?No results for input(s): DDIMER in the last 72 hours. ?Hemoglobin A1C: ?Recent Labs  ?  12/02/21 ?2102  ?HGBA1C 5.8*  ? ?Fasting Lipid Panel: ?Recent Labs  ?  12/02/21 ?2102  ?CHOL 192  ?HDL 39*  ?LDLCALC 110*  ?TRIG 214*  ?CHOLHDL 4.9  ? ?Thyroid Function Tests: ?Recent Labs  ?  12/02/21 ?2102  ?TSH 7.920*  ? ?Anemia Panel: ?No  results for input(s): VITAMINB12, FOLATE, FERRITIN, TIBC, IRON, RETICCTPCT in the last 72 hours. ? ?Imaging: ?DG Chest 2 View ? ?Result Date: 12/02/2021 ?CLINICAL DATA:  Chest pain.  Left arm pain for the last week EXAM: CHEST - 2 VIEW COMPARISON:  01/24/2012 FINDINGS: The heart size and mediastinal contours are within normal limits. Both lungs are clear. The visualized skeletal structures are unremarkable. IMPRESSION: No active cardiopulmonary disease. Electronically Signed   By: Jorje Guild M.D.   On: 12/02/2021 12:11  ? ?ECHOCARDIOGRAM COMPLETE ? ?Result Date: 12/03/2021 ?   ECHOCARDIOGRAM REPORT   Patient Name:   Margaret Dixon Date of Exam: 12/03/2021 Medical Rec #:  [id number]          Height:       64.0 in Accession #:    [id number]         Weight:       146.0 lb Date of Birth:  [birth year]           BSA:          1.711 m? Patient Age:    61 years           BP:  124/64 mmHg Patient Gender: F                  HR:           64 bpm. Exam Location:  Inpatient Procedure: 2D Echo, Color Doppler and Cardiac Doppler Indications:    Acute Ischemic Heart Disease i24.9  History:        Patient has no prior history of Echocardiogram examinations.                 Risk Factors:Dyslipidemia.  Sonographer:    Raquel Sarna Senior RDCS Referring Phys: [id number] [place]  1. Left ventricular ejection fraction, by estimation, is 60 to 65%. The left ventricle has normal function. The left ventricle has no regional wall motion abnormalities. There is mild left ventricular hypertrophy. Left ventricular diastolic parameters are indeterminate.  2. Right ventricular systolic function is normal. The right ventricular size is normal. There is normal pulmonary artery systolic pressure.  3. The mitral valve is grossly normal. No evidence of mitral valve regurgitation.  4. The aortic valve is grossly normal. Aortic valve regurgitation is not visualized.  5. The inferior vena cava is normal in size with greater than 50%  respiratory variability, suggesting right atrial pressure of 3 mmHg. Comparison(s): No prior Echocardiogram. Conclusion(s)/Recommendation(s): Normal biventricular function without evidence of hemodynamically significant valvular heart disease. FINDINGS  Left Ventricle: Left ventricular ejection fraction, by estimation, is 60 to 65%. The left ventricle has normal function. The left ventricle has no regional wall motion abnormalities. The left ventricular internal cavity size was normal in size. There is  mild left ventricular hypertrophy. Left ventricular diastolic parameters are indeterminate. Right Ventricle: The right ventricular size is normal. No increase in right ventricular wall thickness. Right ventricular systolic function is normal. There is normal pulmonary artery systolic pressure. The tricuspid regurgitant velocity is 1.64 m/s, and  with an assumed right atrial pressure of 3 mmHg, the estimated right ventricular systolic pressure is 17.7 mmHg. Left Atrium: Left atrial size was normal in size. Right Atrium: Right atrial size was normal in size. Pericardium: There is no evidence of pericardial effusion. Mitral Valve: The mitral valve is grossly normal. No evidence of mitral valve regurgitation. Tricuspid Valve: The tricuspid valve is grossly normal. Tricuspid valve regurgitation is not demonstrated. Aortic Valve: The aortic valve is grossly normal. Aortic valve regurgitation is not visualized. Pulmonic Valve: The pulmonic valve was not well visualized. Pulmonic valve regurgitation is not visualized. Aorta: The aortic root and ascending aorta are structurally normal, with no evidence of dilitation. Venous: The inferior vena cava is normal in size with greater than 50% respiratory variability, suggesting right atrial pressure of 3 mmHg. IAS/Shunts: No atrial level shunt detected by color flow Doppler.  LEFT VENTRICLE PLAX 2D LVIDd:         3.25 cm   Diastology LVIDs:         1.50 cm   LV e' medial:    6.30  cm/s LV PW:         1.05 cm   LV E/e' medial:  14.0 LV IVS:        1.15 cm   LV e' lateral:   10.70 cm/s LVOT diam:     1.95 cm   LV E/e' lateral: 8.2 LV SV:         70 LV SV Index:   41 LVOT Area:     2.99 cm?  RIGHT VENTRICLE RV S prime:  10.70 cm/s TAPSE (M-mode): 2.1 cm LEFT ATRIUM             Index        RIGHT ATRIUM           Index LA diam:        2.80 cm 1.64 cm/m?   RA Area:     10.50 cm? LA Vol (A2C):   40.1 ml 23.43 ml/m?  RA Volume:   21.50 ml  12.56 ml/m? LA Vol (A4C):   20.8 ml 12.15 ml/m? LA Biplane Vol: 31.6 ml 18.46 ml/m?  AORTIC VALVE LVOT Vmax:   104.00 cm/s LVOT Vmean:  75.800 cm/s LVOT VTI:    0.234 m  AORTA Ao Root diam: 2.65 cm Ao Asc diam:  2.90 cm MITRAL VALVE               TRICUSPID VALVE MV Area (PHT): 2.39 cm?    TR Peak grad:   10.8 mmHg MV Decel Time: 318 msec    TR Vmax:        164.00 cm/s MV E velocity: 88.20 cm/s MV A velocity: 62.90 cm/s  SHUNTS MV E/A ratio:  1.40        Systemic VTI:  0.23 m                            Systemic Diam: 1.95 cm Phineas Inches Electronically signed by Phineas Inches Signature Date/Time: 12/03/2021/9:30:25 AM    Final    ? ?Cardiac Studies: ? ECG: NSR no acute changes  ? ? Telemetry:  NSR 12/04/2021  ? Echo: pending ?eft ventricular ejection fraction, by estimation, is 60 to 65%. The left ventricle has ?normal function. The left ventricle has no regional wall motion abnormalities. There is ?mild left ventricular hypertrophy. Left ventricular diastolic parameters are ?indeterminate. ?1. ?Right ventricular systolic function is normal. The right ventricular size is normal. ?There is normal pulmonary artery systolic pressure. ?2. ?3. The mitral valve is grossly normal. No evidence of mitral valve regurgitation. ?4. The aortic valve is grossly normal. Aortic valve regurgitation is not visualized. ?The inferior vena cava is normal in size with greater than 50% respiratory variability, ?suggesting right atrial pressure of 3 mmHg. ?5. ?Comparison(s): No prior  Echocardiogram. ?Conclusion(s)/Recommendation(s): Normal biventricular function without evidence of hemodynamically ?significant valvular heart disease. ?L heart cath ? ?  1st Diag lesion is 60% stenosed. ?  1st

## 2021-12-06 ENCOUNTER — Telehealth: Payer: Self-pay | Admitting: Nurse Practitioner

## 2021-12-06 NOTE — Telephone Encounter (Signed)
Agree with note that these heart rates are not concerning. If her right wrist is not healing appropriately we will need to schedule a nurse visit to evaluate the wrist. Was she given a return to work note by the hospital discharge team?  I cannot address further restricting her work until I see her in the office.  ?

## 2021-12-06 NOTE — Telephone Encounter (Addendum)
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

## 2021-12-06 NOTE — Telephone Encounter (Signed)
Patient think she is having a reaction to the stint. Please advise ?

## 2021-12-06 NOTE — Telephone Encounter (Signed)
Pt reports that her radial cath site is healing nicely.   She wanted to let us know of her heart rate "racing" she said it lasted about 30 min and during that time it was only registering 70s-80s. Normal now.   ? ?She was given a note from she thinks a cardiologist at the hospital to return to work on 12/11/21.  She has provided it to her job.  She said they are "leary" about her returning to work but accepted it.  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  ?

## 2021-12-07 NOTE — Telephone Encounter (Signed)
Spoke with pt and made her aware that Dr. Johnsie Cancel said it was fine for him to go back to work.  Pt appreciative for call.  ?

## 2021-12-08 ENCOUNTER — Telehealth: Payer: Self-pay | Admitting: Nurse Practitioner

## 2021-12-08 MED FILL — Morphine Sulfate Inj 4 MG/ML: INTRAMUSCULAR | Qty: 1 | Status: AC

## 2021-12-08 MED FILL — Fentanyl Citrate Preservative Free (PF) Inj 100 MCG/2ML: INTRAMUSCULAR | Qty: 0.5 | Status: CN

## 2021-12-08 NOTE — Telephone Encounter (Signed)
Pt c/o medication issue: ? ?1. Name of Medication:  ?ticagrelor (BRILINTA) 90 MG TABS tablet ? ?2. How are you currently taking this medication (dosage and times per day)?  ?1 tablet twice daily, as prescribed ? ?3. Are you having a reaction (difficulty breathing--STAT)?  ?See below ? ?4. What is your medication issue?  ?Patient states since beginning this medication on Monday, 4/10 she has noticed more SOB. She would like to know if she can be put on something else.  ? ?Pt c/o Shortness Of Breath: STAT if SOB developed within the last 24 hours or pt is noticeably SOB on the phone ? ?1. Are you currently SOB (can you hear that pt is SOB on the phone)?  ?No  ? ?2. How long have you been experiencing SOB?  ?Past 4-5 days, since starting on Brilinta ? ?3. Are you SOB when sitting or when up moving around?  ?Both, but mainly when up and moving around  ? ?4. Are you currently experiencing any other symptoms?  ?Tiredness  ? ?

## 2021-12-08 NOTE — Telephone Encounter (Signed)
Spoke with the patient and advised that she should give the medication a week to see if SOB resolves. Paitent state that in addition to shortness of breath the medication is messing with her fibromyalgia. She states she is experiencing more pain, tightness, sensitivity, and fatigue. She would prefer to switch to a different medication.  ?

## 2021-12-08 NOTE — Telephone Encounter (Signed)
In most cases, dyspnea is self-limiting and resolves within 1 week of treatment. ?I would advise patient to wait atleast a week to see if it resolves. Will have to defer to Dr. Martinique if medication were to be changed. Unsure who patients primary cardiologist is. ? ?

## 2021-12-11 NOTE — Telephone Encounter (Signed)
Attempted to call the pt but phone rang several times with no answer.. will try again later at a later time.  ?

## 2021-12-12 NOTE — Telephone Encounter (Signed)
Attempted to call patient at both numbers on file, but no answer. The primary number just rings with no answering service that picks up and the mobile number states "not in service." Triage will continue to try and reach pt. ?

## 2021-12-14 NOTE — Telephone Encounter (Signed)
Tried to call home number, there was no answer. Tried to call mobile number, and it states the number is not in service. Will await for patient to call back, or make changes at patient's office visit on Monday. ?

## 2021-12-14 NOTE — Progress Notes (Signed)
?Cardiology Office Note:   ? ?Date:  12/18/2021  ? ?ID:  MARYLAND Dixon, DOB 1960/10/02, MRN [id number] ? ?PCP:  Doyle Askew, PA-C ?  ?[facility] Providers ?Cardiologist:  Jenkins Rouge, MD ?Click to update primary MD,subspecialty MD or APP then REFRESH:1}   ? ?Referring MD: Doyle Askew, PA-C  ? ?Chief Complaint: post hospital f/u CAD s/p DES to LCx ? ?History of Present Illness:   ? ?Margaret Dixon is a 61 y.o. female with a hx of ACS, CAD s/p NSTEMI, hyperlipidemia, and tobacco abuse.  ? ?Seen in 2013 by Dr. Percival Spanish as a new patient for the evaluation of an abnormal cardiac CT done in ED.  No prior history of cardiac disease.  On 01/23/2012 she had presyncope with a rapid heart rate and dizziness.  The following day she developed severe left-sided chest discomfort and went to the ED for evaluation. Coronary CTA revealed nonobstructive CAD (all within LAD) with a coronary calcium score of 5.3, 85th percentile for her age/sex matched control. At follow-up visit 02/25/12, aggressive risk reduction management was advised along with a 6 month follow-up. ? ?On 12/02/21, she presented to ED with substernal chest pain with elevated hs troponin at 83. Cardiac catheterization revealed single vessel obstructive CAD involving mid LCx 90% stenosed, successful PCI of mid LCx with DES x 1, normal LVEDP, continue DAPT for 1 year. She was discharged home on 12/04/21. ? ?She called our office on 12/06/21 with concerns about returning to work as [employer]. She was cleared to return. She called 12/08/21 with concerns for SOB and fibromyalgia pain on Brilinta. She was advised to try to remain on Brilinta for 1 full week to see if symptoms resolve. If she could not tolerate, we would load with Plavix 300 mg x 1 then 75 mg daily.  ? ?Today, she is here alone for follow-up.  Reports she is doing well since hospital discharge and is becoming more active and eating a more heart healthy diet. Had severe muscle aches and SOB shortly  after discharge. Symptoms have completely resolved since stopping atorvastatin.  She is concerned about the cost of Brilinta and will run out on approximately 01/03/2022. Walking 20-30 minutes daily at a moderate pace. 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 Has fatigue on occasion but it is not limiting. Has quit smoking. She denies chest pain, shortness of breath, lower extremity edema, fatigue, palpitations, melena, hematuria, hemoptysis, diaphoresis, weakness, presyncope, syncope, orthopnea, and PND. ? ? ?Past Medical History:  ?Diagnosis Date  ? Constipation   ? Depression   ? due to Fibromyalgia  ? Diarrhea   ? Fibromyalgia   ? Hormone replacement therapy, postmenopausal   ? PONV (postoperative nausea and vomiting)   ? ? ?Past Surgical History:  ?Procedure Laterality Date  ? ABDOMINAL ADHESION SURGERY  07/28/2015  ? APPENDECTOMY    ? CHOLECYSTECTOMY    ? COLONOSCOPY    ? CORONARY STENT INTERVENTION N/A 12/04/2021  ? Procedure: CORONARY STENT INTERVENTION;  Surgeon: Martinique, Peter M, MD;  Location: [facility];  Service: Cardiovascular;  Laterality: N/A;  circumflex  ? INCISION AND DRAINAGE ABSCESS N/A 11/03/2015  ? Procedure: EXCISION CHRONIC ABDOMINAL WALL STITCH ABSCESS;  Surgeon: Coralie Keens, MD;  Location: [facility];  Service: General;  Laterality: N/A;  ? LAPAROTOMY N/A 07/28/2015  ? Procedure: EXPLORATORY LAPAROTOMY;  Surgeon: Coralie Keens, MD;  Location: [facility];  Service: General;  Laterality: N/A;  ? LEFT HEART CATH AND  CORONARY ANGIOGRAPHY N/A 12/04/2021  ? Procedure: LEFT HEART CATH AND CORONARY ANGIOGRAPHY;  Surgeon: Martinique, Peter M, MD;  Location: [facility];  Service: Cardiovascular;  Laterality: N/A;  ? LYSIS OF ADHESION N/A 07/28/2015  ? Procedure: LYSIS OF ADHESION;  Surgeon: Coralie Keens, MD;  Location: [facility];  Service: General;  Laterality: N/A;  ? TONSILLECTOMY    ? VAGINAL HYSTERECTOMY    ? ? ?Current Medications: ?Current Meds  ?Medication Sig  ? Ascorbic Acid  (VITAMIN C GUMMIES PO) Take by mouth.  ? aspirin 81 MG chewable tablet Chew 1 tablet (81 mg total) by mouth daily.  ? cyanocobalamin (,VITAMIN B-12,) 1000 MCG/ML injection Inject 1,000 mcg into the muscle every 14 (fourteen) days.  ? estradiol (ESTRACE) 1 MG tablet Take 1 mg by mouth daily.  ? Multiple Vitamins-Minerals (MULTIVITAMIN GUMMIES ADULT) CHEW Chew by mouth.  ? nitroGLYCERIN (NITROSTAT) 0.4 MG SL tablet Place 1 tablet (0.4 mg total) under the tongue every 5 (five) minutes x 3 doses as needed for chest pain.  ? ticagrelor (BRILINTA) 90 MG TABS tablet Take 1 tablet (90 mg total) by mouth 2 (two) times daily.  ? Vitamin D, Ergocalciferol, (DRISDOL) 50000 units CAPS capsule Take 50,000 Units by mouth every 7 (seven) days.  ?  ? ?Allergies:   Penicillins, Shellfish allergy, Cephalexin, Clarithromycin, and Levofloxacin  ? ?Social History  ? ?Socioeconomic History  ? Marital status: Divorced  ?  Spouse name: Not on file  ? Number of children: 2  ? Years of education: Not on file  ? Highest education level: Not on file  ?Occupational History  ? Not on file  ?Tobacco Use  ? Smoking status: Every Day  ?  Packs/day: 0.30  ?  Years: 8.00  ?  Pack years: 2.40  ?  Types: Cigarettes  ?  Last attempt to quit: 07/26/2014  ?  Years since quitting: 7.4  ? Smokeless tobacco: Never  ?Substance and Sexual Activity  ? Alcohol use: Yes  ?  Comment: socially  ? Drug use: No  ? Sexual activity: Not on file  ?Other Topics Concern  ? Not on file  ?Social History Narrative  ? Lives alone.  Works at [employer].    ? ?Social Determinants of Health  ? ?Financial Resource Strain: Not on file  ?Food Insecurity: Not on file  ?Transportation Needs: Not on file  ?Physical Activity: Not on file  ?Stress: Not on file  ?Social Connections: Not on file  ?  ? ?Family History: ?The patient's family history includes Cancer in her mother; Colon polyps in her father; Coronary artery disease in her paternal grandfather; Coronary artery  disease (age of onset: 76) in her father; Diabetes in her father; Emphysema in her father. ? ?ROS:   ?Please see the history of present illness.  All other systems reviewed and are negative. ? ?Labs/Other Studies Reviewed:   ? ?The following studies were reviewed today: ? ?LHC 12/04/21 ? ?  1st Diag lesion is 60% stenosed. ?  1st Mrg lesion is 50% stenosed. ?  Mid Cx lesion is 90% stenosed. ?  A drug-eluting stent was successfully placed using a STENT ONYX FRONTIER 2.5X15. ?  Post intervention, there is a 0% residual stenosis. ?  LV end diastolic pressure is normal. ?  ?Single vessel obstructive CAD involving the mid LCx ?Normal LVEDP ?Successful PCI of the mid LCx with DES x 1 ?  ?Plan: DAPT for one year. Anticipate same day DC  after 6 hours  ? ?Echo 12/03/21 ? ?Left Ventricle: Left ventricular ejection fraction, by estimation, is 60  ?to 65%. The left ventricle has normal function. The left ventricle has no  ?regional wall motion abnormalities. The left ventricular internal cavity  ?size was normal in size. There is mild left ventricular hypertrophy.  ?Left ventricular diastolic parameters are indeterminate.  ?Right Ventricle: The right ventricular size is normal. No increase in  ?right ventricular wall thickness. Right ventricular systolic function is  ?normal. There is normal pulmonary artery systolic pressure. The tricuspid  ?regurgitant velocity is 1.64 m/s, and  ? with an assumed right atrial pressure of 3 mmHg, the estimated right  ?ventricular systolic pressure is 24.0 mmHg.  ?Left Atrium: Left atrial size was normal in size.  ?Right Atrium: Right atrial size was normal in size.  ?Pericardium: There is no evidence of pericardial effusion.  ?Mitral Valve: The mitral valve is grossly normal. No evidence of mitral  ?valve regurgitation.  ?Tricuspid Valve: The tricuspid valve is grossly normal. Tricuspid valve  ?regurgitation is not demonstrated.  ?Aortic Valve: The aortic valve is grossly normal. Aortic valve   ?regurgitation is not visualized.  ?Pulmonic Valve: The pulmonic valve was not well visualized. Pulmonic valve  ?regurgitation is not visualized.  ?Aorta: The aortic root and ascending aorta are structurally nor

## 2021-12-15 NOTE — Telephone Encounter (Signed)
Pt states that she is currently still on Brilinta and figured out that it was the Atorvastatin that was causing her "all those issues." Pt states she stopped this medication and issues have resolved. Pt has follow-up appt here 4/24 with Elwyn Reach, but will route to [place] for review and further recommendation. ?

## 2021-12-18 ENCOUNTER — Encounter: Payer: Self-pay | Admitting: Nurse Practitioner

## 2021-12-18 ENCOUNTER — Ambulatory Visit (INDEPENDENT_AMBULATORY_CARE_PROVIDER_SITE_OTHER): Payer: Managed Care, Other (non HMO) | Admitting: Nurse Practitioner

## 2021-12-18 VITALS — BP 130/88 | HR 61 | Ht 64.0 in | Wt 143.0 lb

## 2021-12-18 DIAGNOSIS — I249 Acute ischemic heart disease, unspecified: Secondary | ICD-10-CM

## 2021-12-18 DIAGNOSIS — Z720 Tobacco use: Secondary | ICD-10-CM

## 2021-12-18 DIAGNOSIS — E785 Hyperlipidemia, unspecified: Secondary | ICD-10-CM | POA: Diagnosis not present

## 2021-12-18 DIAGNOSIS — I2510 Atherosclerotic heart disease of native coronary artery without angina pectoris: Secondary | ICD-10-CM | POA: Diagnosis not present

## 2021-12-18 MED ORDER — CARVEDILOL 3.125 MG PO TABS: 3.1250 mg | ORAL_TABLET | Freq: Two times a day (BID) | ORAL | 6 refills | Status: DC

## 2021-12-18 NOTE — Patient Instructions (Addendum)
Medication Instructions:  ? ?Your physician has recommended you make the following change in your medication:  ? ?START Carvedilol one (1) tablet by mouth ( 3.125 mg ) twice daily.  ? ?*If you need a refill on your cardiac medications before your next appointment, please call your pharmacy* ? ?Lab Work: ? ?Your physician recommends that you return for a FASTING LIPID profile/CMET on June 2. You can come in on the day of your appointment anytime between 7:30-4:30 fasting from midnight the night before.  ? ?If you have labs (blood work) drawn today and your tests are completely normal, you will receive your results only by: ?MyChart Message (if you have MyChart) OR ?A paper copy in the mail ?If you have any lab test that is abnormal or we need to change your treatment, we will call you to review the results. ? ?Follow-Up: ?At [facility], you and your health needs are our priority.  As part of our continuing mission to provide you with exceptional heart care, we have created designated Provider Care Teams.  These Care Teams include your primary Cardiologist (physician) and Advanced Practice Providers (APPs -  Physician Assistants and Nurse Practitioners) who all work together to provide you with the care you need, when you need it. ? ?We recommend signing up for the patient portal called "MyChart".  Sign up information is provided on this After Visit Summary.  MyChart is used to connect with patients for Virtual Visits (Telemedicine).  Patients are able to view lab/test results, encounter notes, upcoming appointments, etc.  Non-urgent messages can be sent to your provider as well.   ?To learn more about what you can do with MyChart, go to [email or web address].   ? ?Your next appointment:   ?3 month(s) ? ?The format for your next appointment:   ?In Person ? ?Provider:   ?Jenkins Rouge, MD   ? ?Monitor your Heart Rate call or send mychart 3-4 weeks to let [name] know how you are doing. If you feel well will start  Rosuvastatin (1) tablet by mouth ( 10 mg) daily.  ? ? ?Important Information About Sugar ? ? ? ? ?  ?

## 2021-12-19 ENCOUNTER — Telehealth: Payer: Self-pay

## 2021-12-19 MED ORDER — CLOPIDOGREL BISULFATE 75 MG PO TABS: ORAL_TABLET | ORAL | 3 refills | Status: DC

## 2021-12-19 NOTE — Telephone Encounter (Signed)
**Note De-Identified Shalaina Guardiola Obfuscation** The pts ins prefers Clopidogrel over Brilinta. ? ?Swinyer, Lanice Schwab, NP  Beatris Belen, Deliah Boston, LPN ?She will take 300 mg x 1 dose then 75 mg daily ? ?I called the pts home phone but got no answer and no way to leave a VM so I called her cell phone and got no answer and her VM has not been set up yet so I sent her a [employer] message advising her that her [employer] ins plan prefers Clopidogrel (Plavix) over name brand Brilinta. ? ?Please see 4/24 [facility] note in her chart for details. ?

## 2021-12-28 ENCOUNTER — Telehealth: Payer: Self-pay | Admitting: Cardiovascular Disease

## 2021-12-28 NOTE — Addendum Note (Signed)
Addended by: Devra Dopp E on: 12/28/2021 09:26 AM ? ? Modules accepted: Orders ? ?

## 2021-12-28 NOTE — Telephone Encounter (Signed)
Pt c/o medication issue: ? ?1. Name of Medication:  ?Carvedilol/Brilinta ? ?2. How are you currently taking this medication (dosage and times per day)?  ?Patient takes both medications as prescribed ? ?3. Are you having a reaction (difficulty breathing--STAT)?  ?See below ? ?4. What is your medication issue?  ? ?Patient states over the past 3 weeks she has had several nose bleeds. Just this morning she had 3 nose bleeds. She assumes this is due to one of her new medications, but she is unsure of which. She states she has also been dizzy and nauseous this morning. HR is 149. ? ?STAT if patient feels like he/she is going to faint  ? ?Are you dizzy now?  ?More lightheaded than dizzy  ? ?Do you feel faint or have you passed out?  ?No  ?Do you have any other symptoms?  ?Nausea, chest heaviness ? ?Have you checked your HR and BP (record if available)?  ? ?146/88 49 ?

## 2021-12-28 NOTE — Telephone Encounter (Signed)
Spoke with pt and for clarification pt has had nose bleed only since starting Carvedilol and also notes HR of 49 this am B/P 146/88 and pt noted the other night B/P of 98/66 along with dizziness  ?

## 2021-12-28 NOTE — Telephone Encounter (Signed)
Discussed with Florene Glen NP  have pt hold Carvedilol and to check B/P and HR for the next week andcall with values Pt also wanted to know why no one had notified her of med change and informed pt that attempts were made via home number and cell and unable to leave message a mychart message was sent and pt states does not have mychart and to only call with changes to mobile phone Mychart deactivated ./cy ?

## 2022-01-25 ENCOUNTER — Telehealth: Payer: Self-pay | Admitting: Cardiovascular Disease

## 2022-01-25 NOTE — Telephone Encounter (Signed)
01/25/22 Called both numbers on file to advise pt that her appt on 07/28 with Dr. Johnsie Cancel has been moved from 8:45am to 8:15am. Unable to LVM on either phone and pt does not have MyChart. Sending appt reminder letter to home.  - LCN

## 2022-01-26 ENCOUNTER — Other Ambulatory Visit: Payer: Managed Care, Other (non HMO) | Admitting: *Deleted

## 2022-01-26 DIAGNOSIS — I2510 Atherosclerotic heart disease of native coronary artery without angina pectoris: Secondary | ICD-10-CM

## 2022-01-26 DIAGNOSIS — I249 Acute ischemic heart disease, unspecified: Secondary | ICD-10-CM

## 2022-01-26 DIAGNOSIS — Z720 Tobacco use: Secondary | ICD-10-CM

## 2022-01-26 DIAGNOSIS — E785 Hyperlipidemia, unspecified: Secondary | ICD-10-CM

## 2022-01-26 LAB — COMPREHENSIVE METABOLIC PANEL
ALT: 24 IU/L (ref 0–32)
AST: 27 IU/L (ref 0–40)
Albumin/Globulin Ratio: 1.6 (ref 1.2–2.2)
Albumin: 4.5 g/dL (ref 3.8–4.8)
Alkaline Phosphatase: 95 IU/L (ref 44–121)
BUN/Creatinine Ratio: 15 (ref 12–28)
BUN: 15 mg/dL (ref 8–27)
Bilirubin Total: 0.4 mg/dL (ref 0.0–1.2)
CO2: 26 mmol/L (ref 20–29)
Calcium: 9.7 mg/dL (ref 8.7–10.3)
Chloride: 104 mmol/L (ref 96–106)
Creatinine, Ser: 0.99 mg/dL (ref 0.57–1.00)
Globulin, Total: 2.9 g/dL (ref 1.5–4.5)
Glucose: 111 mg/dL — ABNORMAL HIGH (ref 70–99)
Potassium: 4.9 mmol/L (ref 3.5–5.2)
Sodium: 141 mmol/L (ref 134–144)
Total Protein: 7.4 g/dL (ref 6.0–8.5)
eGFR: 64 mL/min/{1.73_m2} (ref >59)

## 2022-01-26 LAB — LIPID PANEL
Chol/HDL Ratio: 4.2 ratio (ref 0.0–4.4)
Cholesterol, Total: 202 mg/dL — ABNORMAL HIGH (ref 100–199)
HDL: 48 mg/dL (ref >39)
LDL Chol Calc (NIH): 133 mg/dL — ABNORMAL HIGH (ref 0–99)
Triglycerides: 114 mg/dL (ref 0–149)
VLDL Cholesterol Cal: 21 mg/dL (ref 5–40)

## 2022-01-27 ENCOUNTER — Other Ambulatory Visit: Payer: Self-pay | Admitting: Medical

## 2022-02-05 ENCOUNTER — Telehealth: Payer: Self-pay | Admitting: Cardiovascular Disease

## 2022-02-05 DIAGNOSIS — E785 Hyperlipidemia, unspecified: Secondary | ICD-10-CM

## 2022-02-05 NOTE — Telephone Encounter (Signed)
Emmaline Life, NP  01/27/2022  7:43 AM EDT     LDL cholesterol is 133. Goal is < 70 due to her coronary artery disease and recent stent. We discussed having her start rosuvastatin 10 mg at her office visit. Did she start it? If not, we need to add a medication. She can start rosuvastatin or we can refer her to [facility] for an appointment to discuss all of her options. Liver and kidney function are stable with the exception of slightly elevated blood sugar. Continue to work on healthy diet and regular exercise.     The patient has been notified of the result and verbalized understanding.  All questions (if any) were answered. Michaelyn Barter, RN 02/05/2022 12:32 PM    Patient is still refusing to take Rosuvastatin. Will place referral for lipid clinic

## 2022-02-05 NOTE — Telephone Encounter (Signed)
Patient is returning call to discuss lab results. 

## 2022-03-21 NOTE — Progress Notes (Signed)
Cardiology Office Note:    Date:  03/23/2022   ID:  Margaret Dixon, DOB 1961/06/23, MRN [id number]  PCP:  Barrie Lyme   [facility] Providers Cardiologist:  Jenkins Rouge, MD}    Referring MD: Doyle Askew, PA-C   Chief Complaint: post hospital f/u CAD s/p DES to LCx  History of Present Illness:    Margaret Dixon is a 61 y.o. female with a hx of ACS, CAD s/p NSTEMI, hyperlipidemia, and tobacco abuse.   On 12/02/21, she presented to ED with substernal chest pain with elevated hs troponin at 83. Cardiac catheterization revealed single vessel obstructive CAD involving mid LCx 90% stenosed, successful PCI of mid LCx with DES x 1, normal LVEDP, continue DAPT for 1 year. She was discharged home on 12/04/21.  Post hospital had some dyspnea and fibromyalgia Brillinta changed to plavix Lipitor d/c and improved Given CAD told to start lower dose crestor but refused She is back at work as CNA Quit smoking since d/c as well     LDL was 133 on 01/26/22 TSH 7.9 on 12/02/21   She is willing to look at alternatives with lipid clinic No angina compliant with meds   Past Medical History:  Diagnosis Date   Constipation    Depression    due to Fibromyalgia   Diarrhea    Fibromyalgia    Hormone replacement therapy, postmenopausal    PONV (postoperative nausea and vomiting)     Past Surgical History:  Procedure Laterality Date   ABDOMINAL ADHESION SURGERY  07/28/2015   APPENDECTOMY     CHOLECYSTECTOMY     COLONOSCOPY     CORONARY STENT INTERVENTION N/A 12/04/2021   Procedure: CORONARY STENT INTERVENTION;  Surgeon: Martinique, Cozy Veale M, MD;  Location: [facility];  Service: Cardiovascular;  Laterality: N/A;  circumflex   INCISION AND DRAINAGE ABSCESS N/A 11/03/2015   Procedure: EXCISION CHRONIC ABDOMINAL WALL STITCH ABSCESS;  Surgeon: Coralie Keens, MD;  Location: [place];  Service: General;  Laterality: N/A;   LAPAROTOMY N/A 07/28/2015   Procedure:  EXPLORATORY LAPAROTOMY;  Surgeon: Coralie Keens, MD;  Location: [place];  Service: General;  Laterality: N/A;   LEFT HEART CATH AND CORONARY ANGIOGRAPHY N/A 12/04/2021   Procedure: LEFT HEART CATH AND CORONARY ANGIOGRAPHY;  Surgeon: Martinique, Landis Dowdy M, MD;  Location: [place];  Service: Cardiovascular;  Laterality: N/A;   LYSIS OF ADHESION N/A 07/28/2015   Procedure: LYSIS OF ADHESION;  Surgeon: Coralie Keens, MD;  Location: [place];  Service: General;  Laterality: N/A;   TONSILLECTOMY     VAGINAL HYSTERECTOMY      Current Medications: Current Meds  Medication Sig   Ascorbic Acid (VITAMIN C GUMMIES PO) Take by mouth.   aspirin 81 MG chewable tablet Chew 1 tablet (81 mg total) by mouth daily.   clopidogrel (PLAVIX) 75 MG tablet First dose: take 4 tablets (300 mg) on day one and then decrease to 1 tablet (75 mg) daily thereafter. Please take with your largest meal of the day at around the same time each day.   cyanocobalamin (,VITAMIN B-12,) 1000 MCG/ML injection Inject 1,000 mcg into the muscle every 14 (fourteen) days.   estradiol (ESTRACE) 1 MG tablet Take 1 mg by mouth daily.   Multiple Vitamins-Minerals (MULTIVITAMIN GUMMIES ADULT) CHEW Chew by mouth.   nitroGLYCERIN (NITROSTAT) 0.4 MG SL tablet PLACE 1 TABLET UNDER THE TONGUE EVERY 5 MIN X 3 DOSES AS NEEDED FOR CHEST  PAIN.   Vitamin D, Ergocalciferol, (DRISDOL) 50000 units CAPS capsule Take 50,000 Units by mouth every 7 (seven) days.     Allergies:   Penicillins, Shellfish allergy, Cephalexin, Clarithromycin, and Levofloxacin   Social History   Socioeconomic History   Marital status: Divorced    Spouse name: Not on file   Number of children: 2   Years of education: Not on file   Highest education level: Not on file  Occupational History   Not on file  Tobacco Use   Smoking status: Every Day    Packs/day: 0.30    Years: 8.00    Total pack years: 2.40    Types: Cigarettes    Last attempt to quit: 07/26/2014    Years  since quitting: 7.6   Smokeless tobacco: Never  Substance and Sexual Activity   Alcohol use: Yes    Comment: socially   Drug use: No   Sexual activity: Not on file  Other Topics Concern   Not on file  Social History Narrative   Lives alone.  Works at [employer].     Social Determinants of Health   Financial Resource Strain: Not on file  Food Insecurity: Not on file  Transportation Needs: Not on file  Physical Activity: Not on file  Stress: Not on file  Social Connections: Not on file     Family History: The patient's family history includes Cancer in her mother; Colon polyps in her father; Coronary artery disease in her paternal grandfather; Coronary artery disease (age of onset: 75) in her father; Diabetes in her father; Emphysema in her father.  ROS:   Please see the history of present illness.  All other systems reviewed and are negative.  Labs/Other Studies Reviewed:    The following studies were reviewed today:  LHC 12/04/21    1st Diag lesion is 60% stenosed.   1st Mrg lesion is 50% stenosed.   Mid Cx lesion is 90% stenosed.   A drug-eluting stent was successfully placed using a STENT ONYX FRONTIER 2.5X15.   Post intervention, there is a 0% residual stenosis.   LV end diastolic pressure is normal.   Single vessel obstructive CAD involving the mid LCx Normal LVEDP Successful PCI of the mid LCx with DES x 1   Plan: DAPT for one year. Anticipate same day DC after 6 hours   Echo 12/03/21  Left Ventricle: Left ventricular ejection fraction, by estimation, is 60  to 65%. The left ventricle has normal function. The left ventricle has no  regional wall motion abnormalities. The left ventricular internal cavity  size was normal in size. There is mild left ventricular hypertrophy.  Left ventricular diastolic parameters are indeterminate.  Right Ventricle: The right ventricular size is normal. No increase in  right ventricular wall thickness. Right  ventricular systolic function is  normal. There is normal pulmonary artery systolic pressure. The tricuspid  regurgitant velocity is 1.64 m/s, and   with an assumed right atrial pressure of 3 mmHg, the estimated right  ventricular systolic pressure is 28.0 mmHg.  Left Atrium: Left atrial size was normal in size.  Right Atrium: Right atrial size was normal in size.  Pericardium: There is no evidence of pericardial effusion.  Mitral Valve: The mitral valve is grossly normal. No evidence of mitral  valve regurgitation.  Tricuspid Valve: The tricuspid valve is grossly normal. Tricuspid valve  regurgitation is not demonstrated.  Aortic Valve: The aortic valve is grossly normal. Aortic valve  regurgitation  is not visualized.  Pulmonic Valve: The pulmonic valve was not well visualized. Pulmonic valve  regurgitation is not visualized.  Aorta: The aortic root and ascending aorta are structurally normal, with  no evidence of dilitation.  Venous: The inferior vena cava is normal in size with greater than 50%  respiratory variability, suggesting right atrial pressure of 3 mmHg.  IAS/Shunts: No atrial level shunt detected by color flow Doppler.     Recent Labs: 12/02/2021: TSH 7.920 12/04/2021: Hemoglobin 14.4; Platelets 243 01/26/2022: ALT 24; BUN 15; Creatinine, Ser 0.99; Potassium 4.9; Sodium 141  Recent Lipid Panel    Component Value Date/Time   CHOL 202 (H) 01/26/2022 0927   TRIG 114 01/26/2022 0927   HDL 48 01/26/2022 0927   CHOLHDL 4.2 01/26/2022 0927   CHOLHDL 4.9 12/02/2021 2102   VLDL 43 (H) 12/02/2021 2102   LDLCALC 133 (H) 01/26/2022 0927     Risk Assessment/Calculations:       Physical Exam:    VS:  BP 140/75 (BP Location: Left Arm, Patient Position: Sitting, Cuff Size: Normal)   Pulse 75   Ht '5\' 4"'$  (1.626 m)   Wt 135 lb (61.2 kg)   SpO2 100%   BMI 23.17 kg/m     Wt Readings from Last 3 Encounters:  03/23/22 135 lb (61.2 kg)  12/18/21 143 lb (64.9 kg)  12/04/21 145  lb 4.8 oz (65.9 kg)     GEN:  Well nourished, well developed in no acute distress HEENT: Normal NECK: No JVD; No carotid bruits CARDIAC: RRR, no murmurs, rubs, gallops RESPIRATORY:  Clear to auscultation without rales, wheezing or rhonchi  ABDOMEN: Soft, non-tender, non-distended MUSCULOSKELETAL:  No edema; No deformity. 2+ pedal pulses, equal bilaterally SKIN: Warm and dry NEUROLOGIC:  Alert and oriented x 3 PSYCHIATRIC:  Normal affect   EKG:  EKG is ordered today.  The ekg ordered today demonstrates NSR at 61 bpm, no ST/T wave abnormality.   Diagnoses:    No diagnosis found.  Assessment and Plan:     CAD s/p ACS s/p DES to [facility] without angina: Single-vessel obstructive CAD with successful PCI of mid LCx with DES x1.  Plan to continue DAPT for 1 year 12/05/22   She is tolerating aspirin and plavix. She stopped Brillinta due to dyspnea and cost. Was supposed to be on coreg after last visit with NP 12/18/21 but did not   Tobacco abuse: She has completely stopped smoking.  I congratulated her on this achievement.  Hyperlipidemia LDL goal < 70/Hypertriglyceridemia: LDL 133 01/26/22 Myalgias on high dose lipitor Told to start crestor 10 mg she will see lipid clinic  Thyroid:  TSH close to 8 in hospital will update with primary and discuss if low dose synthroid appropriate      Disposition: F/U in a year         Medication Adjustments/Labs and Tests Ordered: Current medicines are reviewed at length with the patient today.  Concerns regarding medicines are outlined above.  No orders of the defined types were placed in this encounter.  No orders of the defined types were placed in this encounter.   Patient Instructions  Medication Instructions:   *If you need a refill on your cardiac medications before your next appointment, please call your pharmacy*   Lab Work:  If you have labs (blood work) drawn today and your tests are completely normal, you will receive your results  only by: [place] (if you have MyChart) OR A paper copy in  the mail If you have any lab test that is abnormal or we need to change your treatment, we will call you to review the results.   Testing/Procedures:    Follow-Up: At [facility], you and your health needs are our priority.  As part of our continuing mission to provide you with exceptional heart care, we have created designated Provider Care Teams.  These Care Teams include your primary Cardiologist (physician) and Advanced Practice Providers (APPs -  Physician Assistants and Nurse Practitioners) who all work together to provide you with the care you need, when you need it.  We recommend signing up for the patient portal called "MyChart".  Sign up information is provided on this After Visit Summary.  MyChart is used to connect with patients for Virtual Visits (Telemedicine).  Patients are able to view lab/test results, encounter notes, upcoming appointments, etc.  Non-urgent messages can be sent to your provider as well.   To learn more about what you can do with MyChart, go to [email or web address].    Your next appointment:   6 month(s)  The format for your next appointment:   In Person  Provider:   Jenkins Rouge, MD {  Other Instructions   Important Information About Sugar         Signed, Jenkins Rouge, MD  03/23/2022 8:25 AM    [place]

## 2022-03-23 ENCOUNTER — Ambulatory Visit (INDEPENDENT_AMBULATORY_CARE_PROVIDER_SITE_OTHER): Payer: Managed Care, Other (non HMO) | Admitting: Cardiovascular Disease

## 2022-03-23 VITALS — BP 140/75 | HR 75 | Ht 64.0 in | Wt 135.0 lb

## 2022-03-23 DIAGNOSIS — E785 Hyperlipidemia, unspecified: Secondary | ICD-10-CM

## 2022-03-23 DIAGNOSIS — I2510 Atherosclerotic heart disease of native coronary artery without angina pectoris: Secondary | ICD-10-CM | POA: Diagnosis not present

## 2022-03-23 DIAGNOSIS — R7989 Other specified abnormal findings of blood chemistry: Secondary | ICD-10-CM

## 2022-03-23 NOTE — Patient Instructions (Addendum)
Medication Instructions:  Your physician recommends that you continue on your current medications as directed. Please refer to the Current Medication list given to you today.  *If you need a refill on your cardiac medications before your next appointment, please call your pharmacy*  Lab Work: If you have labs (blood work) drawn today and your tests are completely normal, you will receive your results only by: [place] (if you have MyChart) OR A paper copy in the mail If you have any lab test that is abnormal or we need to change your treatment, we will call you to review the results.  Follow-Up: At [facility], you and your health needs are our priority.  As part of our continuing mission to provide you with exceptional heart care, we have created designated Provider Care Teams.  These Care Teams include your primary Cardiologist (physician) and Advanced Practice Providers (APPs -  Physician Assistants and Nurse Practitioners) who all work together to provide you with the care you need, when you need it.  We recommend signing up for the patient portal called "MyChart".  Sign up information is provided on this After Visit Summary.  MyChart is used to connect with patients for Virtual Visits (Telemedicine).  Patients are able to view lab/test results, encounter notes, upcoming appointments, etc.  Non-urgent messages can be sent to your provider as well.   To learn more about what you can do with MyChart, go to [email or web address].    Your next appointment:   9 month(s)  The format for your next appointment:   In Person  Provider:   Jenkins Rouge, MD {  Other Instructions You have been referred to [facility].   Important Information About Sugar

## 2022-03-26 ENCOUNTER — Ambulatory Visit: Payer: Managed Care, Other (non HMO)

## 2022-03-26 NOTE — Progress Notes (Deleted)
Patient ID: Margaret Dixon                 DOB: 18-Jan-1961                    MRN: [id number]     HPI: Margaret Dixon is a 61 y.o. female patient referred to lipid clinic by Dr. Johnsie Cancel. PMH is significant for ACS, CAD s/p NSTEMI, hyperlipidemia, and tobacco abuse. On 12/02/21, she presented to ED with substernal chest pain with elevated hs troponin at 83. Cardiac catheterization revealed single vessel obstructive CAD involving mid LCx 90% stenosed, successful PCI of mid LCx with DES x 1, normal LVEDP, continue DAPT for 1 year.  Post hospitalization she had some dyspnea and fibromyalgia. Brillinta was changed to plavix and Lipitor was discontinued. Symptoms improved. Given her CAD she was told to start lower dose crestor but patient refused She is back at work as CNA Quit smoking since d/c as well.  Smoking? Why does she refuse statin? Diet Exercise  Current Medications: none Intolerances: atorvastatin '80mg'$  (muscle pains) Risk Factors: premature CAD, tobacco abuse LDL goal: <55  Diet:   Exercise:   Family History: The patient's family history includes Cancer in her mother; Colon polyps in her father; Coronary artery disease in her paternal grandfather; Coronary artery disease (age of onset: 8) in her father; Diabetes in her father; Emphysema in her father.  Social History:   Labs: 01/26/22 TC 202 TG 114 HDL 48 LDL-C 133 (no therapy)  Past Medical History:  Diagnosis Date   Constipation    Depression    due to Fibromyalgia   Diarrhea    Fibromyalgia    Hormone replacement therapy, postmenopausal    PONV (postoperative nausea and vomiting)     Current Outpatient Medications on File Prior to Visit  Medication Sig Dispense Refill   Ascorbic Acid (VITAMIN C GUMMIES PO) Take by mouth.     aspirin 81 MG chewable tablet Chew 1 tablet (81 mg total) by mouth daily. 90 tablet 3   clopidogrel (PLAVIX) 75 MG tablet First dose: take 4 tablets (300 mg) on day one and then decrease to 1  tablet (75 mg) daily thereafter. Please take with your largest meal of the day at around the same time each day. 93 tablet 3   cyanocobalamin (,VITAMIN B-12,) 1000 MCG/ML injection Inject 1,000 mcg into the muscle every 14 (fourteen) days.     estradiol (ESTRACE) 1 MG tablet Take 1 mg by mouth daily.     Multiple Vitamins-Minerals (MULTIVITAMIN GUMMIES ADULT) CHEW Chew by mouth.     nitroGLYCERIN (NITROSTAT) 0.4 MG SL tablet PLACE 1 TABLET UNDER THE TONGUE EVERY 5 MIN X 3 DOSES AS NEEDED FOR CHEST PAIN. 25 tablet 1   Vitamin D, Ergocalciferol, (DRISDOL) 50000 units CAPS capsule Take 50,000 Units by mouth every 7 (seven) days.     Current Facility-Administered Medications on File Prior to Visit  Medication Dose Route Frequency Provider Last Rate Last Admin   clindamycin (CLEOCIN) 900 mg in dextrose 5 % 50 mL IVPB  900 mg Intravenous 60 min Pre-Op Coralie Keens, MD       And   gentamicin (GARAMYCIN) 5 mg/kg in dextrose 5 % 50 mL IVPB  5 mg/kg Intravenous 60 min Pre-Op Coralie Keens, MD        Allergies  Allergen Reactions   Penicillins Hives and Rash    Has patient had a PCN reaction causing immediate rash, facial/tongue/throat  swelling, SOB or lightheadedness with hypotension: Yes Has patient had a PCN reaction causing severe rash involving mucus membranes or skin necrosis: No Has patient had a PCN reaction that required hospitalization No Has patient had a PCN reaction occurring within the last 10 years: No If all of the above answers are "NO", then may proceed with Cephalosporin use.    Shellfish Allergy Hives and Swelling   Cephalexin    Clarithromycin    Levofloxacin     Assessment/Plan:  1. Hyperlipidemia -    Thank you,   Ramond Dial, Pharm.D, BCPS, CPP [place]  [address] [place], [place] [address]  Phone: [phone]; Fax: [phone]

## 2022-04-19 ENCOUNTER — Telehealth: Payer: Self-pay | Admitting: Cardiovascular Disease

## 2022-04-19 NOTE — Progress Notes (Signed)
Cardiology Office Note:    Date:  04/20/2022   ID:  Margaret Dixon, DOB April 16, 1961, MRN [id number]  PCP:  Barrie Lyme   [facility] Providers Cardiologist:  Jenkins Rouge, MD}    Referring MD: Doyle Askew, PA-C   Chief Complaint: post hospital f/u CAD s/p DES to LCx  History of Present Illness:    Margaret Dixon is a 61 y.o. female with a hx of ACS, CAD s/p NSTEMI, hyperlipidemia, and tobacco abuse.   On 12/02/21, she presented to ED with substernal chest pain with elevated hs troponin at 83. Cardiac catheterization revealed single vessel obstructive CAD involving mid LCx 90% stenosed, successful PCI of mid LCx with DES x 1, normal LVEDP, continue DAPT for 1 year. She was discharged home on 12/04/21.  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   Shared decision making She had single vessel dx with excellent result Favor observation and not immediate cath for one episode of pain in setting of anxiety  Past Medical History:  Diagnosis Date   Constipation    Depression    due to Fibromyalgia   Diarrhea    Fibromyalgia    Hormone replacement therapy, postmenopausal    PONV (postoperative nausea and vomiting)     Past Surgical History:  Procedure Laterality Date   ABDOMINAL ADHESION SURGERY  07/28/2015   APPENDECTOMY     CHOLECYSTECTOMY     COLONOSCOPY     CORONARY STENT INTERVENTION N/A 12/04/2021    Procedure: CORONARY STENT INTERVENTION;  Surgeon: Martinique, Keyera Hattabaugh M, MD;  Location: [place] [facility];  Service: Cardiovascular;  Laterality: N/A;  circumflex   INCISION AND DRAINAGE ABSCESS N/A 11/03/2015   Procedure: EXCISION CHRONIC ABDOMINAL WALL STITCH ABSCESS;  Surgeon: Coralie Keens, MD;  Location: [place];  Service: General;  Laterality: N/A;   LAPAROTOMY N/A 07/28/2015   Procedure: EXPLORATORY LAPAROTOMY;  Surgeon: Coralie Keens, MD;  Location: [place];  Service: General;  Laterality: N/A;   LEFT HEART CATH AND CORONARY ANGIOGRAPHY N/A 12/04/2021   Procedure: LEFT HEART CATH AND CORONARY ANGIOGRAPHY;  Surgeon: Martinique, Rock Sobol M, MD;  Location: [place] [facility];  Service: Cardiovascular;  Laterality: N/A;   LYSIS OF ADHESION N/A 07/28/2015   Procedure: LYSIS OF ADHESION;  Surgeon: Coralie Keens, MD;  Location: [place];  Service: General;  Laterality: N/A;   TONSILLECTOMY     VAGINAL HYSTERECTOMY      Current Medications: Current Meds  Medication Sig   Ascorbic Acid (VITAMIN C GUMMIES PO) Take by mouth.   aspirin 81 MG chewable tablet Chew 1 tablet (81 mg total) by mouth daily.   clopidogrel (PLAVIX) 75 MG tablet First dose: take 4 tablets (300 mg) on day one and then decrease to 1 tablet (75 mg) daily thereafter. Please take with your largest meal of  the day at around the same time each day.   cyanocobalamin (,VITAMIN B-12,) 1000 MCG/ML injection Inject 1,000 mcg into the muscle every 14 (fourteen) days.   estradiol (ESTRACE) 1 MG tablet Take 1 mg by mouth daily.   Multiple Vitamins-Minerals (MULTIVITAMIN GUMMIES ADULT) CHEW Chew by mouth.   nitroGLYCERIN (NITROSTAT) 0.4 MG SL tablet PLACE 1 TABLET UNDER THE TONGUE EVERY 5 MIN X 3 DOSES AS NEEDED FOR CHEST PAIN.   Vitamin D, Ergocalciferol, (DRISDOL) 50000 units CAPS capsule Take 50,000 Units by mouth every 7 (seven) days.     Allergies:   Penicillins, Shellfish allergy, Cephalexin, Clarithromycin, and Levofloxacin    Social History   Socioeconomic History   Marital status: Divorced    Spouse name: Not on file   Number of children: 2   Years of education: Not on file   Highest education level: Not on file  Occupational History   Not on file  Tobacco Use   Smoking status: Every Day    Packs/day: 0.30    Years: 8.00    Total pack years: 2.40    Types: Cigarettes    Last attempt to quit: 07/26/2014    Years since quitting: 7.7   Smokeless tobacco: Never  Substance and Sexual Activity   Alcohol use: Yes    Comment: socially   Drug use: No   Sexual activity: Not on file  Other Topics Concern   Not on file  Social History Narrative   Lives alone.  Works at [employer].     Social Determinants of Health   Financial Resource Strain: Not on file  Food Insecurity: Not on file  Transportation Needs: Not on file  Physical Activity: Not on file  Stress: Not on file  Social Connections: Not on file     Family History: The patient's family history includes Cancer in her mother; Colon polyps in her father; Coronary artery disease in her paternal grandfather; Coronary artery disease (age of onset: 1) in her father; Diabetes in her father; Emphysema in her father.  ROS:   Please see the history of present illness.  All other systems reviewed and are negative.  Labs/Other Studies Reviewed:    The following studies were reviewed today:  LHC 12/04/21    1st Diag lesion is 60% stenosed.   1st Mrg lesion is 50% stenosed.   Mid Cx lesion is 90% stenosed.   A drug-eluting stent was successfully placed using a STENT ONYX FRONTIER 2.5X15.   Post intervention, there is a 0% residual stenosis.   LV end diastolic pressure is normal.   Single vessel obstructive CAD involving the mid LCx Normal LVEDP Successful PCI of the mid LCx with DES x 1   Plan: DAPT for one year. Anticipate same day DC after 6 hours   Echo 12/03/21  Left Ventricle: Left ventricular ejection fraction, by  estimation, is 60  to 65%. The left ventricle has normal function. The left ventricle has no  regional wall motion abnormalities. The left ventricular internal cavity  size was normal in size. There is mild left ventricular hypertrophy.  Left ventricular diastolic parameters are indeterminate.  Right Ventricle: The right ventricular size is normal. No increase in  right ventricular wall thickness. Right ventricular systolic function is  normal. There is normal pulmonary artery systolic pressure. The tricuspid  regurgitant velocity is 1.64 m/s, and   with an assumed right atrial pressure of 3 mmHg, the estimated right  ventricular systolic pressure is 13.8  mmHg.  Left Atrium: Left atrial size was normal in size.  Right Atrium: Right atrial size was normal in size.  Pericardium: There is no evidence of pericardial effusion.  Mitral Valve: The mitral valve is grossly normal. No evidence of mitral  valve regurgitation.  Tricuspid Valve: The tricuspid valve is grossly normal. Tricuspid valve  regurgitation is not demonstrated.  Aortic Valve: The aortic valve is grossly normal. Aortic valve  regurgitation is not visualized.  Pulmonic Valve: The pulmonic valve was not well visualized. Pulmonic valve  regurgitation is not visualized.  Aorta: The aortic root and ascending aorta are structurally normal, with  no evidence of dilitation.  Venous: The inferior vena cava is normal in size with greater than 50%  respiratory variability, suggesting right atrial pressure of 3 mmHg.  IAS/Shunts: No atrial level shunt detected by color flow Doppler.     Recent Labs: 12/02/2021: TSH 7.920 12/04/2021: Hemoglobin 14.4; Platelets 243 01/26/2022: ALT 24; BUN 15; Creatinine, Ser 0.99; Potassium 4.9; Sodium 141  Recent Lipid Panel    Component Value Date/Time   CHOL 202 (H) 01/26/2022 0927   TRIG 114 01/26/2022 0927   HDL 48 01/26/2022 0927   CHOLHDL 4.2 01/26/2022 0927   CHOLHDL 4.9 12/02/2021 2102   VLDL  43 (H) 12/02/2021 2102   LDLCALC 133 (H) 01/26/2022 0927     Risk Assessment/Calculations:       Physical Exam:    VS:  BP 120/80 (BP Location: Left Arm, Patient Position: Sitting, Cuff Size: Normal)   Pulse 62   Ht '5\' 4"'$  (1.626 m)   Wt 134 lb (60.8 kg)   BMI 23.00 kg/m     Wt Readings from Last 3 Encounters:  04/20/22 134 lb (60.8 kg)  03/23/22 135 lb (61.2 kg)  12/18/21 143 lb (64.9 kg)     GEN:  Well nourished, well developed in no acute distress HEENT: Normal NECK: No JVD; No carotid bruits CARDIAC: RRR, no murmurs, rubs, gallops RESPIRATORY:  Clear to auscultation without rales, wheezing or rhonchi  ABDOMEN: Soft, non-tender, non-distended MUSCULOSKELETAL:  No edema; No deformity. 2+ pedal pulses, equal bilaterally SKIN: Warm and dry NEUROLOGIC:  Alert and oriented x 3 PSYCHIATRIC:  Normal affect   EKG:  04/20/2022 NSR Rate 62 normal   Diagnoses:    No diagnosis found.  Assessment and Plan:     CAD s/p ACS s/p DES to [facility] without angina: Single-vessel obstructive CAD with successful PCI of mid LCx with DES x1.  Plan to continue DAPT for 1 year 12/05/22   She is tolerating aspirin and plavix. She stopped Brillinta due to dyspnea and cost. Was supposed to be on coreg after last visit with NP 12/18/21 but did not start Observe Cath for any further episodes in future  ECG totally normal today  Tobacco abuse: She has completely stopped smoking.  I congratulated her on this achievement.  Hyperlipidemia LDL goal < 70/Hypertriglyceridemia: LDL 133 01/26/22 Myalgias on high dose lipitor Told to start crestor 10 mg she will see lipid clinic today  Thyroid:  TSH close to 8 in hospital will update with primary and discuss if low dose synthroid appropriate    Lipid Clinic   Disposition: F/U in 3 months         Medication Adjustments/Labs and Tests Ordered: Current medicines are reviewed at length with the patient today.  Concerns regarding medicines are outlined above.   No orders of the defined types were placed in this encounter.  No orders  of the defined types were placed in this encounter.   There are no Patient Instructions on file for this visit.   Signed, Jenkins Rouge, MD  04/20/2022 8:37 AM    [facility]

## 2022-04-19 NOTE — Progress Notes (Signed)
Patient ID: Margaret Dixon                 DOB: [birth year]                    MRN: [id number]     HPI: Margaret Dixon is a 61 y.o. female patient referred to lipid clinic by Dr. Johnsie Cancel. PMH is significant for CAD, NSTEMI, HLD, tobacco abuse, and fibromyalgia. Pt had NSTEMI 12/02/21 and LHC showed 1st diag 60% stenosed, 1st Mrg 50% stenosed, mid Cx 90% s/p DES x 1. Pt has intolerances to pravastatin and atorvastatin. At visit with [name] on 12/18/21 plan was to start rosuvastatin 10 mg daily. Pt seen by Dr. Johnsie Cancel 03/23/22 and was referred to lipid clinic.   Pt presents to lipid clinic today. She shares that she did not start taking the rosuvastatin 10 mg daily and is not interested in taking any statin medications. Pt shares that she has fibromyalgia and that statins did not work well for her due to this. Pt also shares that since her heart attack she has lost 20 lbs and has been very active throughout the day. 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 She has also made healthy changes in her diet. She limits eating beef, eats lots of fruits and vegetables, eats salads with baked chicken or [place] and bakes and grills her food instead of frying. She also has avoided canned foods and added salt in her diet.   Current Medications: none Intolerances: pravastatin (diarrhea), atorvastatin 80 mg daily (myalgias), rosuvastatin (refused to take)  Risk Factors: CAD, NSTEMI, HLD, smoking LDL goal: <70  Diet:  Doesn't eat a lot of beef Eat lots of fruits, vegetables, no canned foods, avoid salt Breakfast- cereal with blueberries  Lunch/Dinner- salad, baked chicken or [place]  baking and grilling in stead of frying  Snack - banana   Exercise:  Very active throughout the day- takes care of alzheimer's patients Power walks at work over 5000 steps  Lost 20 lbs since heart attack   Family History: mid LCx 90% stenosed, successful PCI of mid LCx with DES x  1  Social History: smokes currently (0.3 ppd), social drinker   Labs: 01/26/22 lipid profile - TC 202, TG 114, HDL 48, LDL 133 (no LLT)  Past Medical History:  Diagnosis Date   Constipation    Depression    due to Fibromyalgia   Diarrhea    Fibromyalgia    Hormone replacement therapy, postmenopausal    PONV (postoperative nausea and vomiting)     Current Outpatient Medications on File Prior to Visit  Medication Sig Dispense Refill   Ascorbic Acid (VITAMIN C GUMMIES PO) Take by mouth.     aspirin 81 MG chewable tablet Chew 1 tablet (81 mg total) by mouth daily. 90 tablet 3   clopidogrel (PLAVIX) 75 MG tablet First dose: take 4 tablets (300 mg) on day one and then decrease to 1 tablet (75 mg) daily thereafter. Please take with your largest meal of the day at around the same time each day. 93 tablet 3   cyanocobalamin (,VITAMIN B-12,) 1000 MCG/ML injection Inject 1,000 mcg into the muscle every 14 (fourteen) days.     estradiol (ESTRACE) 1 MG tablet Take 1 mg by mouth daily.     Multiple Vitamins-Minerals (MULTIVITAMIN GUMMIES ADULT) CHEW Chew by mouth.     nitroGLYCERIN (NITROSTAT) 0.4 MG SL tablet PLACE 1  TABLET UNDER THE TONGUE EVERY 5 MIN X 3 DOSES AS NEEDED FOR CHEST PAIN. 25 tablet 1   Vitamin D, Ergocalciferol, (DRISDOL) 50000 units CAPS capsule Take 50,000 Units by mouth every 7 (seven) days.     Current Facility-Administered Medications on File Prior to Visit  Medication Dose Route Frequency Provider Last Rate Last Admin   clindamycin (CLEOCIN) 900 mg in dextrose 5 % 50 mL IVPB  900 mg Intravenous 60 min Pre-Op Coralie Keens, MD       And   gentamicin (GARAMYCIN) 5 mg/kg in dextrose 5 % 50 mL IVPB  5 mg/kg Intravenous 60 min Pre-Op Coralie Keens, MD        Allergies  Allergen Reactions   Penicillins Hives and Rash    Has patient had a PCN reaction causing immediate rash, facial/tongue/throat swelling, SOB or lightheadedness with hypotension: Yes Has patient had a  PCN reaction causing severe rash involving mucus membranes or skin necrosis: No Has patient had a PCN reaction that required hospitalization No Has patient had a PCN reaction occurring within the last 10 years: No If all of the above answers are "NO", then may proceed with Cephalosporin use.    Shellfish Allergy Hives and Swelling   Cephalexin    Clarithromycin    Levofloxacin     Assessment/Plan:  1. Hyperlipidemia - LDL-C of 133 is above goal of <70. Discussed that PSCK-9 inhibitors like Repatha can lower LDL-C by up to 60%. Discussed that Repatha is a twice monthly injection and reviewed common side effects of injection site reaction and nasopharyngitis. Patient is interested in trying [place]. We will submit prior authorization. Once patient starts Repatha, will schedule patient for follow-up lipid labs in 2 months. Informed patient about co-pay card for Repatha. Encouraged patient to continue positive changes in diet and to continue staying active.    Thank you,  Eliseo Gum, PharmD PGY1 Pharmacy Resident   04/19/2022  10:54 PM    Ramond Dial, Pharm.D, BCPS, CPP [place]  [address], [place], [place] [address]  Phone: [phone]; Fax: [phone]

## 2022-04-19 NOTE — Telephone Encounter (Signed)
Pt c/o of Chest Pain: STAT if CP now or developed within 24 hours  1. Are you having CP right now?  No  2. Are you experiencing any other symptoms (ex. SOB, nausea, vomiting, sweating)? No  3. How long have you been experiencing CP?  Since 30 minutes ago  4. Is your CP continuous or coming and going? Continuous  5. Have you taken Nitroglycerin? Yes  Patient called stating she is at work and started getting chest pains.  Patient stated after she took the Nitroglycerin the pains have eased off. ?

## 2022-04-19 NOTE — Telephone Encounter (Signed)
Called in with c/o CP that occurred about 30 min ago.  Describes as a sharp pain to the left side of heart.  Took X1 NTG with relief. Had PCI in April 2023.  Pt reports had just finished lunch and was helping to get a resident dressed for the day when CP began.  Denies a history or GERD.  BP 156/98-105.  Reports takes clopidogrel 75 mg and ASA 81 mg PO QD as ordered with no missed doses.  Pt is currently CP free. Advised if CP is not relieved with NTG to call 911 for evaluation.  Scheduled OV with Dr. Johnsie Cancel for 04/20/22 at 8:30.  Voiced no further questions or concerns.

## 2022-04-20 ENCOUNTER — Encounter: Payer: Self-pay | Admitting: Cardiovascular Disease

## 2022-04-20 ENCOUNTER — Telehealth: Payer: Self-pay

## 2022-04-20 ENCOUNTER — Ambulatory Visit (INDEPENDENT_AMBULATORY_CARE_PROVIDER_SITE_OTHER): Payer: Managed Care, Other (non HMO) | Admitting: Cardiovascular Disease

## 2022-04-20 ENCOUNTER — Ambulatory Visit (INDEPENDENT_AMBULATORY_CARE_PROVIDER_SITE_OTHER): Payer: Managed Care, Other (non HMO) | Admitting: Pharmacist

## 2022-04-20 VITALS — BP 120/80 | HR 62 | Ht 64.0 in | Wt 134.0 lb

## 2022-04-20 DIAGNOSIS — E785 Hyperlipidemia, unspecified: Secondary | ICD-10-CM

## 2022-04-20 DIAGNOSIS — R079 Chest pain, unspecified: Secondary | ICD-10-CM | POA: Diagnosis not present

## 2022-04-20 DIAGNOSIS — I2510 Atherosclerotic heart disease of native coronary artery without angina pectoris: Secondary | ICD-10-CM

## 2022-04-20 MED ORDER — REPATHA SURECLICK 140 MG/ML ~~LOC~~ SOAJ
1.0000 | SUBCUTANEOUS | 11 refills | Status: DC
Start: 2022-04-20 — End: 2022-07-17

## 2022-04-20 NOTE — Telephone Encounter (Signed)
Patient called back. Advised of approval. Would like Rx sent to [facility]. Advised her to get a copay card from [email or web address]. She will get labs at apt with Dr. Johnsie Cancel in Nov.

## 2022-04-20 NOTE — Telephone Encounter (Signed)
Attempted to call patient and left voice mail. PA for Repatha approved through 04/20/23.

## 2022-04-20 NOTE — Patient Instructions (Signed)
Medication Instructions:  Your physician recommends that you continue on your current medications as directed. Please refer to the Current Medication list given to you today.  *If you need a refill on your cardiac medications before your next appointment, please call your pharmacy*  Lab Work: If you have labs (blood work) drawn today and your tests are completely normal, you will receive your results only by: [place] (if you have MyChart) OR A paper copy in the mail If you have any lab test that is abnormal or we need to change your treatment, we will call you to review the results.  Follow-Up: At [facility], you and your health needs are our priority.  As part of our continuing mission to provide you with exceptional heart care, we have created designated Provider Care Teams.  These Care Teams include your primary Cardiologist (physician) and Advanced Practice Providers (APPs -  Physician Assistants and Nurse Practitioners) who all work together to provide you with the care you need, when you need it.  We recommend signing up for the patient portal called "MyChart".  Sign up information is provided on this After Visit Summary.  MyChart is used to connect with patients for Virtual Visits (Telemedicine).  Patients are able to view lab/test results, encounter notes, upcoming appointments, etc.  Non-urgent messages can be sent to your provider as well.   To learn more about what you can do with MyChart, go to [email or web address].    Your next appointment:   3 month(s)  The format for your next appointment:   In Person  Provider:   Jenkins Rouge, MD {  Important Information About Sugar

## 2022-04-20 NOTE — Patient Instructions (Signed)
Your LDL cholesterol is 133. We would like it to be <70.  Today we discussed that Repatha is a twice monthly injection that would lower your LDL-C by up to 60%.  We will submit prior authorization and give you a call when that is approved.  You can sign up for co-pay card through [employer] website.

## 2022-04-20 NOTE — Addendum Note (Signed)
Addended by: Marcelle Overlie D on: 04/20/2022 04:57 PM   Modules accepted: Orders

## 2022-06-29 NOTE — Progress Notes (Deleted)
Cardiology Office Note:    Date:  06/29/2022   ID:  Margaret Dixon, DOB 1961-08-16, MRN [id number]  PCP:  Barrie Lyme   [facility] Providers Cardiologist:  Jenkins Rouge, MD}    Referring MD: Doyle Askew, PA-C   Chief Complaint: post hospital f/u CAD s/p DES to LCx  History of Present Illness:    Margaret Dixon is a 61 y.o. female with a hx of ACS, CAD s/p NSTEMI, hyperlipidemia, and tobacco abuse.   On 12/02/21, she presented to ED with substernal chest pain with elevated hs troponin at 83. Cardiac catheterization revealed single vessel obstructive CAD involving mid LCx 90% stenosed, successful PCI of mid LCx with DES x 1, normal LVEDP, continue DAPT for 1 year. She was discharged home on 12/04/21.  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   Shared decision making She had single vessel dx with excellent result Favor observation and not immediate cath for one episode of pain in setting of anxiety  Past Medical History:  Diagnosis Date   Constipation    Depression    due to Fibromyalgia   Diarrhea    Fibromyalgia    Hormone replacement therapy, postmenopausal    PONV (postoperative nausea and vomiting)     Past Surgical History:  Procedure Laterality Date   ABDOMINAL ADHESION SURGERY  07/28/2015   APPENDECTOMY     CHOLECYSTECTOMY     COLONOSCOPY     CORONARY STENT INTERVENTION N/A 12/04/2021    Procedure: CORONARY STENT INTERVENTION;  Surgeon: Martinique, Teisha Trowbridge M, MD;  Location: [place] [facility];  Service: Cardiovascular;  Laterality: N/A;  circumflex   INCISION AND DRAINAGE ABSCESS N/A 11/03/2015   Procedure: EXCISION CHRONIC ABDOMINAL WALL STITCH ABSCESS;  Surgeon: Coralie Keens, MD;  Location: [place];  Service: General;  Laterality: N/A;   LAPAROTOMY N/A 07/28/2015   Procedure: EXPLORATORY LAPAROTOMY;  Surgeon: Coralie Keens, MD;  Location: [place];  Service: General;  Laterality: N/A;   LEFT HEART CATH AND CORONARY ANGIOGRAPHY N/A 12/04/2021   Procedure: LEFT HEART CATH AND CORONARY ANGIOGRAPHY;  Surgeon: Martinique, Zakaria Fromer M, MD;  Location: [place] [facility];  Service: Cardiovascular;  Laterality: N/A;   LYSIS OF ADHESION N/A 07/28/2015   Procedure: LYSIS OF ADHESION;  Surgeon: Coralie Keens, MD;  Location: [place];  Service: General;  Laterality: N/A;   TONSILLECTOMY     VAGINAL HYSTERECTOMY      Current Medications: No outpatient medications have been marked as taking for the 07/09/22 encounter (Appointment) with Josue Hector, MD.     Allergies:   Penicillins, Shellfish allergy, Cephalexin, Clarithromycin, and Levofloxacin   Social History   Socioeconomic History   Marital status: Divorced    Spouse name: Not on file   Number of children: 2   Years of education: Not on  file   Highest education level: Not on file  Occupational History   Not on file  Tobacco Use   Smoking status: Every Day    Packs/day: 0.30    Years: 8.00    Total pack years: 2.40    Types: Cigarettes    Last attempt to quit: 07/26/2014    Years since quitting: 7.9   Smokeless tobacco: Never  Substance and Sexual Activity   Alcohol use: Yes    Comment: socially   Drug use: No   Sexual activity: Not on file  Other Topics Concern   Not on file  Social History Narrative   Lives alone.  Works at [employer].     Social Determinants of Health   Financial  Resource Strain: Not on file  Food Insecurity: Not on file  Transportation Needs: Not on file  Physical Activity: Not on file  Stress: Not on file  Social Connections: Not on file     Family History: The patient's family history includes Cancer in her mother; Colon polyps in her father; Coronary artery disease in her paternal grandfather; Coronary artery disease (age of onset: 25) in her father; Diabetes in her father; Emphysema in her father.  ROS:   Please see the history of present illness.  All other systems reviewed and are negative.  Labs/Other Studies Reviewed:    The following studies were reviewed today:  LHC 12/04/21    1st Diag lesion is 60% stenosed.   1st Mrg lesion is 50% stenosed.   Mid Cx lesion is 90% stenosed.   A drug-eluting stent was successfully placed using a STENT ONYX FRONTIER 2.5X15.   Post intervention, there is a 0% residual stenosis.   LV end diastolic pressure is normal.   Single vessel obstructive CAD involving the mid LCx Normal LVEDP Successful PCI of the mid LCx with DES x 1   Plan: DAPT for one year. Anticipate same day DC after 6 hours   Echo 12/03/21  Left Ventricle: Left ventricular ejection fraction, by estimation, is 60  to 65%. The left ventricle has normal function. The left ventricle has no  regional wall motion abnormalities. The left ventricular internal cavity  size was normal in size. There is mild left ventricular hypertrophy.  Left ventricular diastolic parameters are indeterminate.  Right Ventricle: The right ventricular size is normal. No increase in  right ventricular wall thickness. Right ventricular systolic function is  normal. There is normal pulmonary artery systolic pressure. The tricuspid  regurgitant velocity is 1.64 m/s, and   with an assumed right atrial pressure of 3 mmHg, the estimated right  ventricular systolic pressure is 58.0 mmHg.  Left Atrium: Left atrial size was normal in size.  Right Atrium: Right  atrial size was normal in size.  Pericardium: There is no evidence of pericardial effusion.  Mitral Valve: The mitral valve is grossly normal. No evidence of mitral  valve regurgitation.  Tricuspid Valve: The tricuspid valve is grossly normal. Tricuspid valve  regurgitation is not demonstrated.  Aortic Valve: The aortic valve is grossly normal. Aortic valve  regurgitation is not visualized.  Pulmonic Valve: The pulmonic valve was not well visualized. Pulmonic valve  regurgitation is not visualized.  Aorta: The aortic root and ascending aorta are structurally normal, with  no evidence of dilitation.  Venous: The inferior vena cava is normal in size with greater than 50%  respiratory variability, suggesting right atrial pressure of 3 mmHg.  IAS/Shunts: No atrial level shunt detected  by color flow Doppler.     Recent Labs: 12/02/2021: TSH 7.920 12/04/2021: Hemoglobin 14.4; Platelets 243 01/26/2022: ALT 24; BUN 15; Creatinine, Ser 0.99; Potassium 4.9; Sodium 141  Recent Lipid Panel    Component Value Date/Time   CHOL 202 (H) 01/26/2022 0927   TRIG 114 01/26/2022 0927   HDL 48 01/26/2022 0927   CHOLHDL 4.2 01/26/2022 0927   CHOLHDL 4.9 12/02/2021 2102   VLDL 43 (H) 12/02/2021 2102   LDLCALC 133 (H) 01/26/2022 0927     Risk Assessment/Calculations:       Physical Exam:    VS:  There were no vitals taken for this visit.    Wt Readings from Last 3 Encounters:  04/20/22 134 lb (60.8 kg)  03/23/22 135 lb (61.2 kg)  12/18/21 143 lb (64.9 kg)     GEN:  Well nourished, well developed in no acute distress HEENT: Normal NECK: No JVD; No carotid bruits CARDIAC: RRR, no murmurs, rubs, gallops RESPIRATORY:  Clear to auscultation without rales, wheezing or rhonchi  ABDOMEN: Soft, non-tender, non-distended MUSCULOSKELETAL:  No edema; No deformity. 2+ pedal pulses, equal bilaterally SKIN: Warm and dry NEUROLOGIC:  Alert and oriented x 3 PSYCHIATRIC:  Normal affect   EKG:  06/29/2022  NSR Rate 62 normal   Diagnoses:    No diagnosis found.  Assessment and Plan:     CAD s/p ACS s/p DES to [facility] without angina: Single-vessel obstructive CAD with successful PCI of mid LCx with DES x1.  Plan to continue DAPT for 1 year 12/05/22   She is tolerating aspirin and plavix. She stopped Brillinta due to dyspnea and cost. Was supposed to be on coreg after last visit with NP 12/18/21 but did not start Observe Cath for any further episodes in future  ECG totally normal today  Tobacco abuse: She has completely stopped smoking.  I congratulated her on this achievement.  Hyperlipidemia LDL goal < 70/Hypertriglyceridemia: Myalgias with statins started on Repatha 04/20/22 LDL 133 prior to this will update labs  Thyroid:  TSH close to 8 in hospital will update with primary and discuss if low dose synthroid appropriate    TSH/T4 Lipid/Liver   Disposition: F/U in  a year    Signed, Jenkins Rouge, MD  06/29/2022 10:23 AM    [place]

## 2022-07-09 ENCOUNTER — Ambulatory Visit: Payer: Managed Care, Other (non HMO) | Admitting: Cardiovascular Disease

## 2022-07-09 ENCOUNTER — Other Ambulatory Visit: Payer: Managed Care, Other (non HMO)

## 2022-07-16 ENCOUNTER — Ambulatory Visit: Payer: Managed Care, Other (non HMO) | Attending: Cardiovascular Disease

## 2022-07-16 DIAGNOSIS — E785 Hyperlipidemia, unspecified: Secondary | ICD-10-CM

## 2022-07-17 ENCOUNTER — Telehealth: Payer: Self-pay | Admitting: Pharmacist

## 2022-07-17 DIAGNOSIS — E785 Hyperlipidemia, unspecified: Secondary | ICD-10-CM

## 2022-07-17 DIAGNOSIS — I2510 Atherosclerotic heart disease of native coronary artery without angina pectoris: Secondary | ICD-10-CM

## 2022-07-17 LAB — APOLIPOPROTEIN B: Apolipoprotein B: 105 mg/dL — ABNORMAL HIGH (ref <90)

## 2022-07-17 LAB — LIPID PANEL
Chol/HDL Ratio: 3.8 ratio (ref 0.0–4.4)
Cholesterol, Total: 195 mg/dL (ref 100–199)
HDL: 51 mg/dL (ref >39)
LDL Chol Calc (NIH): 126 mg/dL — ABNORMAL HIGH (ref 0–99)
Triglycerides: 100 mg/dL (ref 0–149)
VLDL Cholesterol Cal: 18 mg/dL (ref 5–40)

## 2022-07-17 NOTE — Addendum Note (Signed)
Addended by: Marcelle Overlie D on: 07/17/2022 12:49 PM   Modules accepted: Orders

## 2022-07-17 NOTE — Telephone Encounter (Signed)
Patient called back after this AM conversation about her labs. She just got over [place]. Wonders if her SOB might be from that. She would like to stay on a little longer and see if she has better results. Will continue Repatha and recheck labs in another 2 months.

## 2022-07-26 NOTE — Progress Notes (Signed)
Office Visit    Patient Name: Margaret Dixon Date of Encounter: 07/26/2022  Primary Care Provider:  Barrie Lyme Primary Cardiologist:  Jenkins Rouge, MD Primary Electrophysiologist: None  Chief Complaint    Margaret Dixon is a 61 y.o. female with PMH of CAD s/p NSTEMI with DES to mid left circumflex, HLD, ACS, tobacco abuse who presents today for 77-monthfollow-up of CAD.  Past Medical History    Past Medical History:  Diagnosis Date   Constipation    Depression    due to Fibromyalgia   Diarrhea    Fibromyalgia    Hormone replacement therapy, postmenopausal    PONV (postoperative nausea and vomiting)    Past Surgical History:  Procedure Laterality Date   ABDOMINAL ADHESION SURGERY  07/28/2015   APPENDECTOMY     CHOLECYSTECTOMY     COLONOSCOPY     CORONARY STENT INTERVENTION N/A 12/04/2021   Procedure: CORONARY STENT INTERVENTION;  Surgeon: JMartinique Peter M, MD;  [place] [facility];  Service: Cardiovascular;  Laterality: N/A;  circumflex   INCISION AND DRAINAGE ABSCESS N/A 11/03/2015   Procedure: EXCISION CHRONIC ABDOMINAL WALL STITCH ABSCESS;  Surgeon: DCoralie Keens MD;  [place]  Service: General;  Laterality: N/A;   LAPAROTOMY N/A 07/28/2015   Procedure: EXPLORATORY LAPAROTOMY;  Surgeon: DCoralie Keens MD;  [place]  Service: General;  Laterality: N/A;   LEFT HEART CATH AND CORONARY ANGIOGRAPHY N/A 12/04/2021   Procedure: LEFT HEART CATH AND CORONARY ANGIOGRAPHY;  Surgeon: JMartinique Peter M, MD;  [place] [facility];  Service: Cardiovascular;  Laterality: N/A;   LYSIS OF ADHESION N/A 07/28/2015   Procedure: LYSIS OF ADHESION;  Surgeon: DCoralie Keens MD;  [place]  Service: General;  Laterality: N/A;   TONSILLECTOMY     VAGINAL HYSTERECTOMY      Allergies  Allergies  Allergen Reactions   Penicillins Hives and Rash    Has patient had a PCN reaction causing immediate rash,  facial/tongue/throat swelling, SOB or lightheadedness with hypotension: Yes Has patient had a PCN reaction causing severe rash involving mucus membranes or skin necrosis: No Has patient had a PCN reaction that required hospitalization No Has patient had a PCN reaction occurring within the last 10 years: No If all of the above answers are "NO", then may proceed with Cephalosporin use.    Shellfish Allergy Hives and Swelling   Cephalexin    Clarithromycin    Levofloxacin     History of Present Illness    Margaret Dixon is a 61year old female with the above mention past medical history who presents today for follow-up of CAD.  She was initially seen by Dr. HPercival Spanishin 2013 as a new patient for evaluation of an abnormal cardiac CT.  She had originally presented to the ED with complaint of presyncope and rapid heartbeat.  EKG was completed which was nonacute and cardiac CT demonstrated coronary calcifications which were considered advanced for her age.  She had nonobstructive LAD plaque noted.  She also noted intermittent chest discomfort that occurs sporadically.    She was lost to follow-up until 11/2021 when she presented to the ED with NSTEMI after noting chest pain radiating to both arms.  At bedtime troponins were elevated at 83 and she was started on IV heparin and underwent LHC that revealed single vessel obstructive CAD in the mid Lcx, normal LVEDP. The patient was treated with  DESx1. She was started on Aspirin and Bilinta, plan to continue DAPT for 12 months.  She was seen in follow-up on 12/18/2021 by Christen Bame, NP and reported doing well at visit but did note severe muscle aches and shortness of breath after her initial discharge.  She stopped taking her atorvastatin and noticed pain had resolved.  She given the option of starting rosuvastatin 10 mg for rechallenge of statin but patient refused.  She was switched from Brilinta to Plavix and started on carvedilol 3.125 mg twice  daily.  She was referred to the lipid clinic for alternatives to statin therapy.  She was seen last by Dr.Nishan on 04/20/2022 for complaint of sharp left-sided pain that was relieved with nitroglycerin x 1.  Shared decision was made for observation and need for possible cath in the future for episodes of recurrent chest pain.  EKG was normal during visit and patient had stopped smoking.  Margaret Dixon presents today for 55-monthfollow-up alone.  Since last being seen in the office patient reports that her chest discomfort has resolved but does endorse hives and possible angioedema with Repatha.  She has since stopped her Repatha and is not interested in restarting at this time.  She reports no other adverse reactions with her current medications.  She reports that her chest pain may have been related to possible anxiety.  Her blood pressure today is well-controlled at 124/80 and heart rate was 56 bpm.  She is staying busy and following a heart healthy diet.  During our visit we discussed alternatives to statin therapy that she can discuss further at her lipid clinic visit. Patient denies chest pain, palpitations, dyspnea, PND, orthopnea, nausea, vomiting, dizziness, syncope, edema, weight gain, or early satiety.  Home Medications    Current Outpatient Medications  Medication Sig Dispense Refill   Ascorbic Acid (VITAMIN C GUMMIES PO) Take by mouth.     aspirin 81 MG chewable tablet Chew 1 tablet (81 mg total) by mouth daily. 90 tablet 3   clopidogrel (PLAVIX) 75 MG tablet First dose: take 4 tablets (300 mg) on day one and then decrease to 1 tablet (75 mg) daily thereafter. Please take with your largest meal of the day at around the same time each day. 93 tablet 3   cyanocobalamin (,VITAMIN B-12,) 1000 MCG/ML injection Inject 1,000 mcg into the muscle every 14 (fourteen) days.     estradiol (ESTRACE) 1 MG tablet Take 1 mg by mouth daily.     Evolocumab (REPATHA SURECLICK) 1924MG/ML SOAJ Inject 140 mg into  the skin every 14 (fourteen) days. 2 mL 11   Multiple Vitamins-Minerals (MULTIVITAMIN GUMMIES ADULT) CHEW Chew by mouth.     nitroGLYCERIN (NITROSTAT) 0.4 MG SL tablet PLACE 1 TABLET UNDER THE TONGUE EVERY 5 MIN X 3 DOSES AS NEEDED FOR CHEST PAIN. 25 tablet 1   Vitamin D, Ergocalciferol, (DRISDOL) 50000 units CAPS capsule Take 50,000 Units by mouth every 7 (seven) days.     No current facility-administered medications for this visit.   Facility-Administered Medications Ordered in Other Visits  Medication Dose Route Frequency Provider Last Rate Last Admin   clindamycin (CLEOCIN) 900 mg in dextrose 5 % 50 mL IVPB  900 mg Intravenous 60 min Pre-Op BCoralie Keens MD       And   gentamicin (GARAMYCIN) 5 mg/kg in dextrose 5 % 50 mL IVPB  5 mg/kg Intravenous 60 min Pre-Op BCoralie Keens MD         Review of Systems  Please see the history of present illness.    (+) Myalgias and shortness of breath   All other systems reviewed and are otherwise negative except as noted above.  Physical Exam    Wt Readings from Last 3 Encounters:  04/20/22 134 lb (60.8 kg)  03/23/22 135 lb (61.2 kg)  12/18/21 143 lb (64.9 kg)   YH:CWCBJ were no vitals filed for this visit.,There is no height or weight on file to calculate BMI.  Constitutional:      Appearance: Healthy appearance. Not in distress.  Neck:     Vascular: JVD normal.  Pulmonary:     Effort: Pulmonary effort is normal.     Breath sounds: No wheezing. No rales. Diminished in the bases Cardiovascular:     Normal rate. Regular rhythm. Normal S1. Normal S2.      Murmurs: There is no murmur.  Edema:    Peripheral edema absent.  Abdominal:     Palpations: Abdomen is soft non tender. There is no hepatomegaly.  Skin:    General: Skin is warm and dry.  Neurological:     General: No focal deficit present.     Mental Status: Alert and oriented to person, place and time.     Cranial Nerves: Cranial nerves are intact.  EKG/LABS/Other  Studies Reviewed    ECG personally reviewed by me today -none completed today  Lab Results  Component Value Date   WBC 8.4 12/04/2021   HGB 14.4 12/04/2021   HCT 42.1 12/04/2021   MCV 90.7 12/04/2021   PLT 243 12/04/2021   Lab Results  Component Value Date   CREATININE 0.99 01/26/2022   BUN 15 01/26/2022   NA 141 01/26/2022   K 4.9 01/26/2022   CL 104 01/26/2022   CO2 26 01/26/2022   Lab Results  Component Value Date   ALT 24 01/26/2022   AST 27 01/26/2022   ALKPHOS 95 01/26/2022   BILITOT 0.4 01/26/2022   Lab Results  Component Value Date   CHOL 195 07/16/2022   HDL 51 07/16/2022   LDLCALC 126 (H) 07/16/2022   TRIG 100 07/16/2022   CHOLHDL 3.8 07/16/2022    Lab Results  Component Value Date   HGBA1C 5.8 (H) 12/02/2021    Assessment & Plan    1.  Coronary artery disease: -s/p NSTEMI 11/2021 with DES x 1 to [facility] with recurrence of chest pain 4 months later that resolved with nitroglycerin x 1 -Today patient reports resolution of chest pain with no recurrence.  She endorses possible anxiety as causative factor for chest discomfort. -She was advised to seek follow-up at the ED if chest pain reoccurs and is not relieved with as needed nitroglycerin. -Continue GDMT with ASA 81 mg, Plavix 75 mg, Repatha 140 mg and nitroglycerin 0.4 mg as needed  2.  Hyperlipidemia: -Patient's last LDL was 126 -Patient with known statin intolerance now currently taking Repatha and followed by lipid clinic.  -She reports continued myopathy with hives and possible angioedema with Repatha.  Symptoms have resolved since discontinuing medication. -She has since discontinued medication and will follow-up with lipid clinic in January.  3.  History of tobacco abuse: -Patient is continuing to abstain from tobacco abuse.  4.  Precordial chest pain: -Patient reports resolution of chest pain with possible anxiety as cause. -Patient advised to contact our office if she has any further  complaints with anxiety or chest pain.   Disposition: Follow-up with Jenkins Rouge, MD or APP in 12 months  Medication Adjustments/Labs and Tests Ordered: Current medicines are reviewed at length with the patient today.  Concerns regarding medicines are outlined above.   Signed, Margaret Dixon, Marissa Nestle, NP 07/26/2022, 9:26 AM [facility]  Note:  This document was prepared using Dragon voice recognition software and may include unintentional dictation errors.

## 2022-07-27 ENCOUNTER — Encounter: Payer: Self-pay | Admitting: Nurse Practitioner

## 2022-07-27 ENCOUNTER — Ambulatory Visit: Payer: Managed Care, Other (non HMO) | Attending: Cardiovascular Disease | Admitting: Nurse Practitioner

## 2022-07-27 VITALS — BP 124/80 | HR 56 | Ht 64.0 in | Wt 143.4 lb

## 2022-07-27 DIAGNOSIS — Z720 Tobacco use: Secondary | ICD-10-CM | POA: Diagnosis not present

## 2022-07-27 DIAGNOSIS — E785 Hyperlipidemia, unspecified: Secondary | ICD-10-CM | POA: Diagnosis not present

## 2022-07-27 DIAGNOSIS — I2510 Atherosclerotic heart disease of native coronary artery without angina pectoris: Secondary | ICD-10-CM | POA: Diagnosis not present

## 2022-07-27 DIAGNOSIS — R072 Precordial pain: Secondary | ICD-10-CM | POA: Diagnosis not present

## 2022-07-27 NOTE — Patient Instructions (Signed)
Medication Instructions:  Your physician recommends that you continue on your current medications as directed. Please refer to the Current Medication list given to you today. *If you need a refill on your cardiac medications before your next appointment, please call your pharmacy*  Lab Work: None ordered  Testing/Procedures: None Ordered  Follow-Up: At [facility], you and your health needs are our priority.  As part of our continuing mission to provide you with exceptional heart care, we have created designated Provider Care Teams.  These Care Teams include your primary Cardiologist (physician) and Advanced Practice Providers (APPs -  Physician Assistants and Nurse Practitioners) who all work together to provide you with the care you need, when you need it.  We recommend signing up for the patient portal called "MyChart".  Sign up information is provided on this After Visit Summary.  MyChart is used to connect with patients for Virtual Visits (Telemedicine).  Patients are able to view lab/test results, encounter notes, upcoming appointments, etc.  Non-urgent messages can be sent to your provider as well.   To learn more about what you can do with MyChart, go to [email or web address].    Your next appointment:   12 month(s)  The format for your next appointment:   In Person  Provider:   Jenkins Rouge, MD     Your physician recommends that you schedule a follow-up appointment in: January 2024 with PharmD Other Instructions   Important Information About Sugar

## 2022-08-31 ENCOUNTER — Telehealth: Payer: Self-pay | Admitting: Cardiovascular Disease

## 2022-08-31 NOTE — Telephone Encounter (Signed)
Pt c/o Shortness Of Breath: STAT if SOB developed within the last 24 hours or pt is noticeably SOB on the phone  1. Are you currently SOB (can you hear that pt is SOB on the phone)? SOB  2. How long have you been experiencing SOB? This morning   3. Are you SOB when sitting or when up moving around? Moving around  4. Are you currently experiencing any other symptoms? Chest hurts ?

## 2022-08-31 NOTE — Telephone Encounter (Signed)
Covid in November, felt like resolved completely.   Bronchitis and double ear infection, fever at urgent care 10 days ago.  Took 100 mg doxycycline-completed course.  Now having SOB, wheezing and sharp, left sided chest discomfort when up and moving. Can't catch breath.  Still coughing.  No recent fevers.  "It feels different from when I had a heart attack, that was different".    I encouraged patient to contact her PCP today for a visit and if she cannot get in today that she needs to return to urgent care today.  She voices understanding and agreement.

## 2022-09-06 ENCOUNTER — Ambulatory Visit: Payer: Managed Care, Other (non HMO) | Attending: Cardiology | Admitting: Pharmacist

## 2022-09-06 ENCOUNTER — Ambulatory Visit: Payer: Managed Care, Other (non HMO) | Admitting: Cardiology

## 2022-09-06 ENCOUNTER — Encounter: Payer: Self-pay | Admitting: *Deleted

## 2022-09-06 ENCOUNTER — Telehealth: Payer: Self-pay | Admitting: Pharmacist

## 2022-09-06 DIAGNOSIS — R0602 Shortness of breath: Secondary | ICD-10-CM

## 2022-09-06 DIAGNOSIS — E785 Hyperlipidemia, unspecified: Secondary | ICD-10-CM

## 2022-09-06 DIAGNOSIS — I2510 Atherosclerotic heart disease of native coronary artery without angina pectoris: Secondary | ICD-10-CM | POA: Diagnosis not present

## 2022-09-06 NOTE — Telephone Encounter (Signed)
PA denied since patient has not tried both rosuvastatin and atorvastatin. I have submitted an e-appeals.

## 2022-09-06 NOTE — Progress Notes (Signed)
Patient ID: Margaret Dixon                 DOB: Dec 11, 1960                    MRN: [id number]     HPI: Margaret Dixon is a 62 y.o. female patient referred to lipid clinic by Margaret Dixon. PMH is significant for CAD, NSTEMI, HLD, tobacco abuse, and fibromyalgia. Pt had NSTEMI 12/02/21 and LHC showed 1st diag 60% stenosed, 1st Mrg 50% stenosed, mid Cx 90% s/p DES x 1. Pt has intolerances to pravastatin and atorvastatin. At visit with Margaret Dixon on 12/18/21 plan was to start rosuvastatin 10 mg daily. Pt seen by Margaret Dixon 03/23/22 and was referred to lipid clinic. Patient seen in lipid clinic 04/20/22 and was started on Repatha. Repeat lipid panel showed very little improvement in LDL-C. Patient also reported SOB which she wasn't sure if it was from Margaret Dixon or Margaret Dixon. We decided to stay on Repatha for 2 more months and then repeat labs. However, when she was seen by Margaret Dixon a few weeks later, she reported hives and possible angioedema with Repatha and stopped therapy.   Patient presents today to lipid clinic.  She reports she had COVID in November and then the flu around Christmas.  She reports that she still experiencing shortness of breath when she walks and some chest heaviness which she has never had before.  She is very concerned wants to rule out any heart causes.  Margaret Dixon who was the doctor of the day came down to evaluate patient and she has been set up with a stress test and echo.  Please see Margaret Dixon note for more details.  Patient reports that she had hives to Repatha, she is no longer taking.  She does not want to take statins.  She does not do any strength training but was walking prior to her recent illnesses.  Eating plenty of fresh fruits and vegetables and trying to eat healthy.  She quit smoking 3 months ago cold [place].  Now can even stand the smell of cigarettes.  Although she did not drink a lot of alcohol prior she is no longer drinking alcohol either.   [name]: [id number] BIN:  [id number]  DOB: 02/03/1961  Group ID: [id number] PCN: [id number]  Legal sex: F  Group name: Margaret Dixon  Address: [address] [address]    Current Medications: none Intolerances: pravastatin (diarrhea), atorvastatin 80 mg daily (myalgias), rosuvastatin (refused to take), Repatha (angioedema? Hives, shortness of breath,poor response) Risk Factors: CAD, NSTEMI, HLD, smoking LDL goal: <70  Diet:  Doesn't eat a lot of beef Eat lots of fruits, vegetables, no canned foods, avoid salt Breakfast- cereal with blueberries  Lunch/Dinner- salad, baked chicken or [place]  baking and grilling in stead of frying  Snack - banana   Exercise:  Very active throughout the day- takes care of alzheimer's patients Power walks at work over 5000 steps  Lost 20 lbs since heart attack   Family History: mid LCx 90% stenosed, successful PCI of mid LCx with DES x 1  Social History: quit 3 months ago, no alcohol  Labs: 07/16/2022 TC 195, TG 100, HDL 51 LDL 126 ApoB 105 (Repatha '140mg'$ ) 01/26/22 lipid profile - TC 202, TG 114, HDL 48, LDL 133 (no LLT)  Past Medical History:  Diagnosis Date   Constipation  Depression    due to Fibromyalgia   Diarrhea    Fibromyalgia    Hormone replacement therapy, postmenopausal    PONV (postoperative nausea and vomiting)     Current Outpatient Medications on File Prior to Visit  Medication Sig Dispense Refill   Ascorbic Acid (VITAMIN C GUMMIES PO) Take by mouth.     aspirin 81 MG chewable tablet Chew 1 tablet (81 mg total) by mouth daily. 90 tablet 3   clopidogrel (PLAVIX) 75 MG tablet First dose: take 4 tablets (300 mg) on day one and then decrease to 1 tablet (75 mg) daily thereafter. Please take with your largest meal of the day at around the same time each day. 93 tablet 3   cyanocobalamin (,VITAMIN B-12,) 1000 MCG/ML injection Inject 1,000 mcg into the muscle every 14 (fourteen) days.     estradiol (ESTRACE) 1 MG tablet Take 1 mg by  mouth daily.     Evolocumab (REPATHA SURECLICK) 937 MG/ML SOAJ Inject 140 mg into the skin every 14 (fourteen) days. 2 mL 11   Multiple Vitamins-Minerals (MULTIVITAMIN GUMMIES ADULT) CHEW Chew by mouth.     nitroGLYCERIN (NITROSTAT) 0.4 MG SL tablet PLACE 1 TABLET UNDER THE TONGUE EVERY 5 MIN X 3 DOSES AS NEEDED FOR CHEST PAIN. 25 tablet 1   Vitamin D, Ergocalciferol, (DRISDOL) 50000 units CAPS capsule Take 50,000 Units by mouth every 7 (seven) days.     Current Facility-Administered Medications on File Prior to Visit  Medication Dose Route Frequency Provider Last Rate Last Admin   clindamycin (CLEOCIN) 900 mg in dextrose 5 % 50 mL IVPB  900 mg Intravenous 60 min Pre-Op Margaret Keens, MD       And   gentamicin (GARAMYCIN) 5 mg/kg in dextrose 5 % 50 mL IVPB  5 mg/kg Intravenous 60 min Pre-Op Margaret Keens, MD        Allergies  Allergen Reactions   Penicillins Hives and Rash    Has patient had a PCN reaction causing immediate rash, facial/tongue/throat swelling, SOB or lightheadedness with hypotension: Yes Has patient had a PCN reaction causing severe rash involving mucus membranes or skin necrosis: No Has patient had a PCN reaction that required hospitalization No Has patient had a PCN reaction occurring within the last 10 years: No If all of the above answers are "NO", then may proceed with Cephalosporin use.    Shellfish Allergy Hives and Swelling   Cephalexin    Clarithromycin    Levofloxacin     Assessment/Plan:  1. Hyperlipidemia -  Assessment: LDL-C remains above goal of less than 55 Patient refuses statins Had allergic reaction to Repatha, she also had a very poor response Reports a healthy diet Quit smoking, congratulated her on this great achievement Discussed options of trying another statin versus Nexletol versus Nexlizet versus Leqvio Reviewed side effects (including increased uric acid and tendon rupture) efficacy and cost of medications with  patient  Plan: Will submit a prior authorization for Nexlizet I will contact patient once I hear back from the insurance Repeat labs in 2 to 3 months including CMP, lipid panel and uric acid  Thank you,  Margaret Dixon D Margaret Dixon, Pharm.D, BCPS, CPP [facility]  [address], [place], [place] [address]  Phone: [phone]; Fax: [phone]

## 2022-09-06 NOTE — Patient Instructions (Signed)
I will submit a prior auth for Nexlizet. I will call you once I hear back   Call me at [phone] with any questions

## 2022-09-06 NOTE — Progress Notes (Signed)
Cardiology Office Note:    Date:  09/06/2022   ID:  Margaret Dixon, DOB 1960/09/29, MRN [id number]  PCP:  Michaels, Ashley L, [facility] Providers Cardiologist:  Jenkins Rouge, MD     Referring MD: Doyle Askew, PA-C    History of Present Illness:    Margaret Dixon is a 62 y.o. female here at pharmacy visit with complaints of worsening shortness of breath, occasional chest discomfort.  She recently had flu back in August 22, 2022.  She has never felt completely healed.  She is still feeling short of breath with exertion.  Occasional tightness in her chest.  Had stent placed in April 2023 and mid circumflex.  This was in the setting of non-STEMI.  Past Medical History:  Diagnosis Date   Constipation    Depression    due to Fibromyalgia   Diarrhea    Fibromyalgia    Hormone replacement therapy, postmenopausal    PONV (postoperative nausea and vomiting)     Past Surgical History:  Procedure Laterality Date   ABDOMINAL ADHESION SURGERY  07/28/2015   APPENDECTOMY     CHOLECYSTECTOMY     COLONOSCOPY     CORONARY STENT INTERVENTION N/A 12/04/2021   Procedure: CORONARY STENT INTERVENTION;  Surgeon: Martinique, Peter M, MD;  Location: [facility];  Service: Cardiovascular;  Laterality: N/A;  circumflex   INCISION AND DRAINAGE ABSCESS N/A 11/03/2015   Procedure: EXCISION CHRONIC ABDOMINAL WALL STITCH ABSCESS;  Surgeon: Coralie Keens, MD;  Location: [facility];  Service: General;  Laterality: N/A;   LAPAROTOMY N/A 07/28/2015   Procedure: EXPLORATORY LAPAROTOMY;  Surgeon: Coralie Keens, MD;  Location: [facility];  Service: General;  Laterality: N/A;   LEFT HEART CATH AND CORONARY ANGIOGRAPHY N/A 12/04/2021   Procedure: LEFT HEART CATH AND CORONARY ANGIOGRAPHY;  Surgeon: Martinique, Peter M, MD;  Location: [facility];  Service: Cardiovascular;  Laterality: N/A;   LYSIS OF ADHESION N/A 07/28/2015   Procedure: LYSIS OF ADHESION;  Surgeon:  Coralie Keens, MD;  Location: [facility];  Service: General;  Laterality: N/A;   TONSILLECTOMY     VAGINAL HYSTERECTOMY      Current Medications: No outpatient medications have been marked as taking for the 09/06/22 encounter (Office Visit) with Jerline Pain, MD.     Allergies:   Penicillins, Repatha [evolocumab], Shellfish allergy, Cephalexin, Clarithromycin, and Levofloxacin   Social History   Socioeconomic History   Marital status: Divorced    Spouse name: Not on file   Number of children: 2   Years of education: Not on file   Highest education level: Not on file  Occupational History   Not on file  Tobacco Use   Smoking status: Every Day    Packs/day: 0.30    Years: 8.00    Total pack years: 2.40    Types: Cigarettes    Last attempt to quit: 07/26/2014    Years since quitting: 8.1   Smokeless tobacco: Never  Substance and Sexual Activity   Alcohol use: Yes    Comment: socially   Drug use: No   Sexual activity: Not on file  Other Topics Concern   Not on file  Social History Narrative   Lives alone.  Works at [employer].     Social Determinants of Health   Financial Resource Strain: Not on file  Food Insecurity: Not on file  Transportation Needs: Not on file  Physical Activity: Not  on file  Stress: Not on file  Social Connections: Not on file     Family History: The patient's family history includes Cancer in her mother; Colon polyps in her father; Coronary artery disease in her paternal grandfather; Coronary artery disease (age of onset: 8) in her father; Diabetes in her father; Emphysema in her father.  ROS:   Please see the history of present illness.    No current fevers, no syncope, no bleeding all other systems reviewed and are negative.  EKGs/Labs/Other Studies Reviewed:    Recent Labs: 12/02/2021: TSH 7.920 12/04/2021: Hemoglobin 14.4; Platelets 243 01/26/2022: ALT 24; BUN 15; Creatinine, Ser 0.99; Potassium 4.9; Sodium 141  Recent  Lipid Panel    Component Value Date/Time   CHOL 195 07/16/2022 0916   TRIG 100 07/16/2022 0916   HDL 51 07/16/2022 0916   CHOLHDL 3.8 07/16/2022 0916   CHOLHDL 4.9 12/02/2021 2102   VLDL 43 (H) 12/02/2021 2102   LDLCALC 126 (H) 07/16/2022 0916     Risk Assessment/Calculations:              Physical Exam:    VS:  There were no vitals taken for this visit.    Wt Readings from Last 3 Encounters:  07/27/22 143 lb 6.4 oz (65 kg)  04/20/22 134 lb (60.8 kg)  03/23/22 135 lb (61.2 kg)     GEN:  Well nourished, well developed in no acute distress HEENT: Normal NECK: No JVD; No carotid bruits LYMPHATICS: No lymphadenopathy CARDIAC: RRR, no murmurs, rubs, gallops RESPIRATORY:  Clear to auscultation without rales, wheezing or rhonchi  ABDOMEN: Soft, non-tender, non-distended MUSCULOSKELETAL:  No edema; No deformity  SKIN: Warm and dry NEUROLOGIC:  Alert and oriented x 3 PSYCHIATRIC:  Normal affect   ASSESSMENT:    1. Coronary artery disease involving native coronary artery of native heart without angina pectoris   2. SOB (shortness of breath)    PLAN:    In order of problems listed above:  Shortness of breath, intermittent chest tightness - We will go ahead and check an echocardiogram to ensure proper structure and function of her heart especially in the setting of her recent viral illness, flu.  We want to make sure that there is no evidence of cardiomyopathy present.  Prior EKG reviewed and heart rate 62 bpm with no ST segment changes.  Coronary artery disease status post circumflex stent in April 2023 in the setting of non-STEMI - With occasional chest tightness, we will go ahead and check a pharmacologic stress test to make sure that there is no high risk ischemia especially in the circumflex territory. -If studies are reassuring, and if shortness of breath continues, could always consider pulmonary evaluation.  Hyperlipidemia - Followed by lipid clinic.  Appreciate  their assistance.      Shared Decision Making/Informed Consent The risks [chest pain, shortness of breath, cardiac arrhythmias, dizziness, blood pressure fluctuations, myocardial infarction, stroke/transient ischemic attack, nausea, vomiting, allergic reaction, radiation exposure, metallic taste sensation and life-threatening complications (estimated to be 1 in 10,000)], benefits (risk stratification, diagnosing coronary artery disease, treatment guidance) and alternatives of a nuclear stress test were discussed in detail with Ms. Bushnell and she agrees to proceed.    Medication Adjustments/Labs and Tests Ordered: Current medicines are reviewed at length with the patient today.  Concerns regarding medicines are outlined above.  Orders Placed This Encounter  Procedures   MYOCARDIAL PERFUSION IMAGING   ECHOCARDIOGRAM COMPLETE   No orders of the defined types were  placed in this encounter.   Patient Instructions  Medication Instructions:  The current medical regimen is effective;  continue present plan and medications.  *If you need a refill on your cardiac medications before your next appointment, please call your pharmacy*  Testing/Procedures: Your physician has requested that you have an echocardiogram. Echocardiography is a painless test that uses sound waves to create images of your heart. It provides your doctor with information about the size and shape of your heart and how well your heart's chambers and valves are working. This procedure takes approximately one hour. There are no restrictions for this procedure. Please do NOT wear cologne, perfume, aftershave, or lotions (deodorant is allowed). Please arrive 15 minutes prior to your appointment time.  Your physician has requested that you have a lexiscan myoview. For further information please visit [email or web address]. Please follow instruction sheet, as given.  Follow-Up: At [facility], you and your health needs are  our priority.  As part of our continuing mission to provide you with exceptional heart care, we have created designated Provider Care Teams.  These Care Teams include your primary Cardiologist (physician) and Advanced Practice Providers (APPs -  Physician Assistants and Nurse Practitioners) who all work together to provide you with the care you need, when you need it.  We recommend signing up for the patient portal called "MyChart".  Sign up information is provided on this After Visit Summary.  MyChart is used to connect with patients for Virtual Visits (Telemedicine).  Patients are able to view lab/test results, encounter notes, upcoming appointments, etc.  Non-urgent messages can be sent to your provider as well.   To learn more about what you can do with MyChart, go to [email or web address].    Your next appointment:   As previously scheduled.  Provider:   Jenkins Rouge, MD       Signed, Margaret Furbish, MD  09/06/2022 12:45 PM    [place]

## 2022-09-06 NOTE — Patient Instructions (Signed)
Medication Instructions:  The current medical regimen is effective;  continue present plan and medications.  *If you need a refill on your cardiac medications before your next appointment, please call your pharmacy*  Testing/Procedures: Your physician has requested that you have an echocardiogram. Echocardiography is a painless test that uses sound waves to create images of your heart. It provides your doctor with information about the size and shape of your heart and how well your heart's chambers and valves are working. This procedure takes approximately one hour. There are no restrictions for this procedure. Please do NOT wear cologne, perfume, aftershave, or lotions (deodorant is allowed). Please arrive 15 minutes prior to your appointment time.  Your physician has requested that you have a lexiscan myoview. For further information please visit [email or web address]. Please follow instruction sheet, as given.  Follow-Up: At [facility], you and your health needs are our priority.  As part of our continuing mission to provide you with exceptional heart care, we have created designated Provider Care Teams.  These Care Teams include your primary Cardiologist (physician) and Advanced Practice Providers (APPs -  Physician Assistants and Nurse Practitioners) who all work together to provide you with the care you need, when you need it.  We recommend signing up for the patient portal called "MyChart".  Sign up information is provided on this After Visit Summary.  MyChart is used to connect with patients for Virtual Visits (Telemedicine).  Patients are able to view lab/test results, encounter notes, upcoming appointments, etc.  Non-urgent messages can be sent to your provider as well.   To learn more about what you can do with MyChart, go to [email or web address].    Your next appointment:   As previously scheduled.  Provider:   Jenkins Rouge, MD

## 2022-09-06 NOTE — Telephone Encounter (Signed)
Nezlizet PA submitted Key: Karolee Stamps

## 2022-09-07 ENCOUNTER — Other Ambulatory Visit: Payer: Managed Care, Other (non HMO)

## 2022-09-10 MED ORDER — NEXLIZET 180-10 MG PO TABS: 1.0000 | ORAL_TABLET | Freq: Every day | ORAL | 11 refills | Status: DC

## 2022-09-10 NOTE — Telephone Encounter (Signed)
Nexlizet appeals overturned and approved through 09/07/23. Called pt. No answer. LVM for patient to call back.

## 2022-09-10 NOTE — Telephone Encounter (Signed)
Patient called back.  Made aware of approval.  States her pharmacy said it was cost $225  Advised she go to [email or web address] to get a co-pay card.  She did not want to set up repeat labs at this time, states she will make it when she comes for her test on February 1.  Advised patient to call with any problems or concerns.

## 2022-09-11 MED ORDER — ATORVASTATIN CALCIUM 10 MG PO TABS: 10.0000 mg | ORAL_TABLET | Freq: Every day | ORAL | 3 refills | Status: DC

## 2022-09-11 NOTE — Telephone Encounter (Signed)
Returned patient's call. Copay still over $200 even with co-pay card.  I called pharmacy to confirm this, the voucher had already been applied. only takes off $125 which left patient with over $200 co-pay.  Patient cannot afford this.  We discussed options, patient would like to retry atorvastatin but at a lower dose.  Rx for atorvastatin 10 mg daily sent to pharmacy.  Will recheck labs in 2 to 3 months.

## 2022-09-11 NOTE — Telephone Encounter (Signed)
Patient called back stating the Nextlizet is too expensive for her even with the special discount card.  Patient stated she would like to get a different medication.

## 2022-09-11 NOTE — Addendum Note (Signed)
Addended by: Marcelle Overlie D on: 09/11/2022 01:07 PM   Modules accepted: Orders

## 2022-09-21 ENCOUNTER — Telehealth (HOSPITAL_COMMUNITY): Payer: Self-pay | Admitting: *Deleted

## 2022-09-21 NOTE — Telephone Encounter (Signed)
Per DPR left detailed instructions for MPI study on 09/27/22.

## 2022-09-27 ENCOUNTER — Ambulatory Visit (HOSPITAL_BASED_OUTPATIENT_CLINIC_OR_DEPARTMENT_OTHER): Payer: Managed Care, Other (non HMO)

## 2022-09-27 ENCOUNTER — Ambulatory Visit (HOSPITAL_COMMUNITY): Payer: Managed Care, Other (non HMO) | Attending: Cardiology

## 2022-09-27 DIAGNOSIS — I2510 Atherosclerotic heart disease of native coronary artery without angina pectoris: Secondary | ICD-10-CM | POA: Diagnosis not present

## 2022-09-27 DIAGNOSIS — R0602 Shortness of breath: Secondary | ICD-10-CM | POA: Insufficient documentation

## 2022-09-27 LAB — ECHOCARDIOGRAM COMPLETE
Area-P 1/2: 1.49 cm2
S' Lateral: 2 cm

## 2022-09-27 LAB — MYOCARDIAL PERFUSION IMAGING
LV dias vol: 54 mL (ref 46–106)
LV sys vol: 15 mL
Nuc Stress EF: 71 %
Peak HR: 104 {beats}/min
Rest HR: 54 {beats}/min
Rest Nuclear Isotope Dose: 10.7 mCi
SDS: 5
SRS: 0
SSS: 5
ST Depression (mm): 0 mm
Stress Nuclear Isotope Dose: 30.2 mCi
TID: 1

## 2022-09-27 MED ORDER — TECHNETIUM TC 99M TETROFOSMIN IV KIT
30.2000 | PACK | Freq: Once | INTRAVENOUS | Status: AC | PRN
  Administered 2022-09-27: 30.2 via INTRAVENOUS

## 2022-09-27 MED ORDER — REGADENOSON 0.4 MG/5ML IV SOLN
0.4000 mg | Freq: Once | INTRAVENOUS | Status: AC
  Administered 2022-09-27: 0.4 mg via INTRAVENOUS

## 2022-09-27 MED ORDER — TECHNETIUM TC 99M TETROFOSMIN IV KIT
10.7000 | PACK | Freq: Once | INTRAVENOUS | Status: AC | PRN
  Administered 2022-09-27: 10.7 via INTRAVENOUS

## 2022-09-28 ENCOUNTER — Telehealth: Payer: Self-pay | Admitting: Cardiology

## 2022-09-28 NOTE — Telephone Encounter (Signed)
Pt was called with results of both echo and lexiscan.  Please see those results for further documentation.

## 2022-09-28 NOTE — Telephone Encounter (Signed)
Pt calling for test results (echo and stress test)

## 2022-12-08 ENCOUNTER — Other Ambulatory Visit: Payer: Self-pay | Admitting: Medical

## 2023-01-07 ENCOUNTER — Other Ambulatory Visit: Payer: Self-pay | Admitting: Nurse Practitioner

## 2023-01-09 ENCOUNTER — Telehealth: Payer: Self-pay | Admitting: Cardiology

## 2023-01-09 NOTE — Telephone Encounter (Signed)
Pt called to report that her OBGYN is no longer in practice and she cannot get her Estradiol refilled.. she is seeing a new MD in 03/2023 the soonest she could get in and asking if Dr Eden Emms could give her a refill for 90 days until that appt.   The MD she had been using for refills was Betsey Holiday PA.. [phone]. I called the practice and they moved from [place] to [place]. I call her back and gave her their number.

## 2023-01-09 NOTE — Telephone Encounter (Signed)
Pt belongs to Dr Eden Emms - please address.

## 2023-01-09 NOTE — Telephone Encounter (Signed)
Patient called and wanted to speak with a Dr. Anne Fu or nurse about her medication estradiol (ESTRACE) 1 MG tablet . Would not display any other comments

## 2023-03-25 ENCOUNTER — Other Ambulatory Visit (HOSPITAL_COMMUNITY): Payer: Self-pay

## 2023-03-25 ENCOUNTER — Telehealth: Payer: Self-pay

## 2023-03-25 NOTE — Telephone Encounter (Signed)
Pharmacy Patient Advocate Encounter   Received notification from CoverMyMeds that prior authorization for REPATHA is required/requested.   Insurance verification completed.   The patient is insured through [employer] .   Per test claim: The current 30 day co-pay is, $24.99.  No PA needed at this time.

## 2023-04-03 ENCOUNTER — Ambulatory Visit (INDEPENDENT_AMBULATORY_CARE_PROVIDER_SITE_OTHER): Payer: Managed Care, Other (non HMO)

## 2023-04-03 ENCOUNTER — Encounter: Payer: Self-pay | Admitting: Family Medicine

## 2023-04-03 ENCOUNTER — Ambulatory Visit: Payer: Managed Care, Other (non HMO) | Admitting: Family Medicine

## 2023-04-03 VITALS — BP 127/74 | HR 73 | Temp 98.2°F | Ht 64.0 in | Wt 151.0 lb

## 2023-04-03 DIAGNOSIS — R7303 Prediabetes: Secondary | ICD-10-CM | POA: Insufficient documentation

## 2023-04-03 DIAGNOSIS — E079 Disorder of thyroid, unspecified: Secondary | ICD-10-CM | POA: Diagnosis not present

## 2023-04-03 DIAGNOSIS — I252 Old myocardial infarction: Secondary | ICD-10-CM

## 2023-04-03 DIAGNOSIS — E7800 Pure hypercholesterolemia, unspecified: Secondary | ICD-10-CM

## 2023-04-03 DIAGNOSIS — M542 Cervicalgia: Secondary | ICD-10-CM

## 2023-04-03 DIAGNOSIS — Z136 Encounter for screening for cardiovascular disorders: Secondary | ICD-10-CM | POA: Diagnosis not present

## 2023-04-03 DIAGNOSIS — H6993 Unspecified Eustachian tube disorder, bilateral: Secondary | ICD-10-CM | POA: Insufficient documentation

## 2023-04-03 DIAGNOSIS — M797 Fibromyalgia: Secondary | ICD-10-CM

## 2023-04-03 DIAGNOSIS — E538 Deficiency of other specified B group vitamins: Secondary | ICD-10-CM | POA: Insufficient documentation

## 2023-04-03 DIAGNOSIS — E559 Vitamin D deficiency, unspecified: Secondary | ICD-10-CM

## 2023-04-03 DIAGNOSIS — Z87891 Personal history of nicotine dependence: Secondary | ICD-10-CM | POA: Insufficient documentation

## 2023-04-03 LAB — BAYER DCA HB A1C WAIVED: HB A1C (BAYER DCA - WAIVED): 5.6 % (ref 4.8–5.6)

## 2023-04-03 MED ORDER — NITROGLYCERIN 0.4 MG SL SUBL: 0.4000 mg | SUBLINGUAL_TABLET | SUBLINGUAL | 1 refills | Status: DC | PRN

## 2023-04-03 MED ORDER — NAPROXEN 500 MG PO TABS: 500.0000 mg | ORAL_TABLET | Freq: Two times a day (BID) | ORAL | 0 refills | Status: DC

## 2023-04-03 MED ORDER — PREDNISONE 20 MG PO TABS
40.0000 mg | ORAL_TABLET | Freq: Every day | ORAL | 0 refills | Status: AC
Start: 2023-04-03 — End: 2023-04-08

## 2023-04-03 NOTE — Patient Instructions (Signed)
Zyrtec or Xyzal with Flonase

## 2023-04-03 NOTE — Progress Notes (Signed)
New Patient Office Visit  Subjective   Patient ID: Margaret Dixon, female    DOB: Jun 11, 1961  Age: 62 y.o. MRN: [id number]  CC:  Chief Complaint  Patient presents with   Neck Pain   New Patient (Initial Visit)   HPI Margaret Dixon presents to establish care. She was previously at [facility]  States that she wanted to move to the [facility] because her cardiology is with [facility].  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 She is not taking any medications for thyroid at this time.  Denies symptoms except for weight gain. Patient attributes this to quitting smoking.   Prediabetes  Presents with concern about her blood glucose. States that she was told her A1C was "high" when she was admitted to the hospital for NSTEMI. Has not had follow up lab since then. Feels that when she does not eat in the mornings she is more irritable and feels that her BG is low. Father was diabetic. Endorses increased polyuria and polydipsia Has been trying to drink water.   Right neck/shoulder pain  States that she is having pain, numbness and tingling on the right side of her neck that radiates down her arm. States that it started for one week ago.  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   Fibromyalgia  Does not follow with speciality for fibromyalgia. Reports that she is able to manage the symptoms. Her symptoms improved since she started taking Vitamin B 12 injections and Vitamin D supplementation.   Vitamin B 12 deficiency  Received Vitamin B12 injections from previous provider and would like to restart them here. Patient does not know if she was deficient in Vitamin B12. Reports that supplementation is treatment for her fibromyalgia.    Hyperlipidemia  She is following with Lipid Clinic.  Reports that she has cut out red meats. She eats vegetables, switched to [place] meat and lean chicken. Tries to exercise but due to the weather has been limited. Tolerating Lipitor 10 mg.    Tobacco Formerly  Sates that she quit 06/09/22  Has gained some weight over this past year since quitting.   Eustachian Tube Dysfunction  States that she has ear fullness and pressure. Reports that it started a few weeks ago. Feels that her ears pop when she drives from [place] into town. Has not tried anything for it. Denies other symptoms of fever, runny nose, congestion   Outpatient Encounter Medications as of 04/03/2023  Medication Sig   Ascorbic Acid (VITAMIN C GUMMIES PO) Take by mouth.   aspirin (ASPIRIN LOW DOSE) 81 MG chewable tablet CHEW 1 TABLET BY MOUTH DAILY.   atorvastatin (LIPITOR) 10 MG tablet Take 1 tablet (10 mg total) by mouth daily.   cyanocobalamin (,VITAMIN B-12,) 1000 MCG/ML injection Inject 1,000 mcg into the muscle every 14 (fourteen) days.   estradiol (ESTRACE) 1 MG tablet Take 1 mg by mouth daily.   Multiple Vitamins-Minerals (MULTIVITAMIN GUMMIES ADULT) CHEW Chew by mouth.   Vitamin D, Ergocalciferol, (DRISDOL) 50000 units CAPS capsule Take 50,000 Units by mouth every 7 (seven) days.   [DISCONTINUED] ondansetron (ZOFRAN) 4 MG tablet Take 4 mg by mouth 4 (four) times  daily as needed.   clopidogrel (PLAVIX) 75 MG tablet FIRST DOSE: TAKE 4 TABLETS (300 MG) ON DAY ONE AND THEN DECREASE TO 1 TABLET (75 MG) DAILY THEREAFTER. PLEASE TAKE WITH YOUR LARGEST MEAL OF THE DAY AT AROUND THE SAME TIME EACH DAY. (Patient not taking: Reported on 04/03/2023)   nitroGLYCERIN (NITROSTAT) 0.4 MG SL tablet PLACE 1 TABLET UNDER THE TONGUE EVERY 5 MIN X 3 DOSES AS NEEDED FOR CHEST PAIN. (Patient not taking: Reported on 04/03/2023)   Facility-Administered Encounter Medications as of 04/03/2023  Medication   clindamycin (CLEOCIN) 900 mg in dextrose  5 % 50 mL IVPB   And   gentamicin (GARAMYCIN) 5 mg/kg in dextrose 5 % 50 mL IVPB    Past Medical History:  Diagnosis Date   Constipation    Depression    due to Fibromyalgia   Diarrhea    Fibromyalgia    Hormone replacement therapy, postmenopausal    PONV (postoperative nausea and vomiting)     Past Surgical History:  Procedure Laterality Date   ABDOMINAL ADHESION SURGERY  07/28/2015   APPENDECTOMY     CHOLECYSTECTOMY     COLONOSCOPY     CORONARY STENT INTERVENTION N/A 12/04/2021   Procedure: CORONARY STENT INTERVENTION;  Surgeon: Swaziland, Peter M, MD;  Location: [facility];  Service: Cardiovascular;  Laterality: N/A;  circumflex   INCISION AND DRAINAGE ABSCESS N/A 11/03/2015   Procedure: EXCISION CHRONIC ABDOMINAL WALL STITCH ABSCESS;  Surgeon: Abigail Miyamoto, MD;  Location: [facility];  Service: General;  Laterality: N/A;   LAPAROTOMY N/A 07/28/2015   Procedure: EXPLORATORY LAPAROTOMY;  Surgeon: Abigail Miyamoto, MD;  Location: [facility] OR;  Service: General;  Laterality: N/A;   LEFT HEART CATH AND CORONARY ANGIOGRAPHY N/A 12/04/2021   Procedure: LEFT HEART CATH AND CORONARY ANGIOGRAPHY;  Surgeon: Swaziland, Peter M, MD;  Location: [facility] CV LAB;  Service: Cardiovascular;  Laterality: N/A;   LYSIS OF ADHESION N/A 07/28/2015   Procedure: LYSIS OF ADHESION;  Surgeon: Abigail Miyamoto, MD;  Location: [facility] OR;  Service: General;  Laterality: N/A;   TONSILLECTOMY     VAGINAL HYSTERECTOMY      Family History  Problem Relation Age of Onset   Coronary artery disease Father 9   Diabetes Father    Colon polyps Father    Emphysema Father    Coronary artery disease Paternal Grandfather        Died of CAD at an early age.   Cancer Mother     Social History   Socioeconomic History   Marital status: Divorced    Spouse name: Not on file   Number of children: 2   Years of education: Not on file   Highest education level: Not on file  Occupational History   Not on  file  Tobacco Use   Smoking status: Every Day    Current packs/day: 0.00    Average packs/day: 0.3 packs/day for 8.0 years (2.4 ttl pk-yrs)    Types: Cigarettes    Start date: 07/26/2006    Last attempt to quit: 07/26/2014    Years since quitting: 8.6   Smokeless tobacco: Never  Substance and Sexual Activity   Alcohol use: Yes    Comment: socially   Drug use: No   Sexual activity: Not on file  Other Topics Concern   Not on file  Social History Narrative   Lives alone.  Works at [employer], sport and exercise.     Social Determinants of Health  Financial Resource Strain: Not on file  Food Insecurity: Not on file  Transportation Needs: Not on file  Physical Activity: Not on file  Stress: Not on file  Social Connections: Unknown (01/05/2022)   Received from [facility]   Social Network    Social Network: Not on file  Intimate Partner Violence: Unknown (11/28/2021)   Received from [facility]   HITS    Physically Hurt: Not on file    Insult or Talk Down To: Not on file    Threaten Physical Harm: Not on file    Scream or Curse: Not on file    ROS As per HPI  Objective   BP 127/74   Pulse 73   Temp 98.2 F (36.8 C)   Ht 5\' 4"  (1.626 m)   Wt 151 lb (68.5 kg)   SpO2 96%   BMI 25.92 kg/m   Physical Exam Constitutional:      General: She is awake. She is not in acute distress.    Appearance: Normal appearance. She is well-developed and well-groomed. She is not ill-appearing, toxic-appearing or diaphoretic.  Neck:     Thyroid: No thyroid mass, thyromegaly or thyroid tenderness.     Trachea: Trachea and phonation normal.  Cardiovascular:     Rate and Rhythm: Normal rate and regular rhythm.     Pulses: Normal pulses.          Radial pulses are 2+ on the right side and 2+ on the left side.       Posterior tibial pulses are 2+ on the right side and 2+ on the left side.     Heart sounds: Normal heart sounds. No murmur heard.    No gallop.   Pulmonary:     Effort: Pulmonary effort is normal. No respiratory distress.     Breath sounds: Normal breath sounds. No stridor. No wheezing, rhonchi or rales.  Musculoskeletal:     Cervical back: Full passive range of motion without pain and neck supple.     Right lower leg: No edema.     Left lower leg: No edema.  Lymphadenopathy:     Cervical: No cervical adenopathy.  Skin:    General: Skin is warm.     Capillary Refill: Capillary refill takes less than 2 seconds.  Neurological:     General: No focal deficit present.     Mental Status: She is alert, oriented to person, place, and time and easily aroused. Mental status is at baseline.     GCS: GCS eye subscore is 4. GCS verbal subscore is 5. GCS motor subscore is 6.     Motor: No weakness.  Psychiatric:        Attention and Perception: Attention and perception normal.        Mood and Affect: Mood and affect normal.        Speech: Speech normal.        Behavior: Behavior normal. Behavior is cooperative.        Thought Content: Thought content normal. Thought content does not include homicidal or suicidal ideation. Thought content does not include homicidal or suicidal plan.        Cognition and Memory: Cognition and memory normal.        Judgment: Judgment normal.       04/03/2023    1:13 PM  Depression screen PHQ 2/9  Decreased Interest 0  Down, Depressed, Hopeless 0  PHQ - 2 Score 0  Altered sleeping  0  Tired, decreased energy 0  Change in appetite 0  Feeling bad or failure about yourself  0  Trouble concentrating 0  Moving slowly or fidgety/restless 0  Suicidal thoughts 0  PHQ-9 Score 0  Difficult doing work/chores Not difficult at all      04/03/2023    1:13 PM  GAD 7 : Generalized Anxiety Score  Nervous, Anxious, on Edge 0  Control/stop worrying 0  Worry too much - different things 0  Trouble relaxing 0  Restless 0  Easily annoyed or irritable 0  Afraid - awful might happen 0  Total GAD 7 Score 0  Anxiety  Difficulty Not difficult at all   Assessment & Plan:  1. Thyroid dysfunction Labs as below. Will communicate results to patient once available. Will await results to determine next steps.  - Thyroid Panel With TSH; Future  2. Prediabetes Labs as below. Will communicate results to patient once available. Will await results to determine next steps.  - CBC with Differential/Platelet; Future - VITAMIN D 25 Hydroxy (Vit-D Deficiency, Fractures); Future - Bayer DCA Hb A1c Waived; Future  3. Encounter for screening for cardiovascular disorders Not fasting today. Plans to complete once fasting. Instructed patient to make a lab appt.  - Lipid panel; Future  4. Neck pain Imaging as below.  Will start conservative management as below.  Reviewed A1C from 12/02/21, well controlled at 5.8%  - DG Cervical Spine Complete - predniSONE (DELTASONE) 20 MG tablet; Take 2 tablets (40 mg total) by mouth daily with breakfast for 5 days.  Dispense: 10 tablet; Refill: 0  5. Fibromyalgia Well controlled at this time. Continue current regimen.   6. Vitamin B12 deficiency Per chart review, patient was deficient in B12 on 10/19/14 Labs as below. Will communicate results to patient once available. Will await results to determine next steps.  - Anemia Profile B; Future  7. Vitamin D deficiency Per chart review on Care Everywhere, patient was deficient on 10/19/14. Labs as above. Patient is currently taking supplementation. Will await results and determine next steps.   8. Pure hypercholesterolemia Labs as above. Managed by Lipid Clinic. Patient to follow up with Lipid Clinic.   9. Former tobacco use Praised patient on her cessation   10. Eustachian tube dysfunction, bilateral Discussed OTC treatment options with patient. Patient to try zyrtec or xyzal and flonase.   11. History of non-ST elevation myocardial infarction (NSTEMI) Refill as below  - nitroGLYCERIN (NITROSTAT) 0.4 MG SL tablet; Place 1 tablet  (0.4 mg total) under the tongue every 5 (five) minutes as needed for chest pain. PLACE 1 TABLET UNDER THE TONGUE EVERY 5 MIN X 3 DOSES AS NEEDED FOR CHEST PAIN.  Dispense: 25 tablet; Refill: 1   The above assessment and management plan was discussed with the patient. The patient verbalized understanding of and has agreed to the management plan using shared-decision making. Patient is aware to call the clinic if they develop any new symptoms or if symptoms fail to improve or worsen. Patient is aware when to return to the clinic for a follow-up visit. Patient educated on when it is appropriate to go to the emergency department.   Return in about 3 months (around 07/04/2023) for Chronic Condition Follow up.   Neale Burly, DNP-FNP [facility] [address] [place], [place] [address] [phone]

## 2023-04-05 NOTE — Progress Notes (Signed)
Slightly elevated iron. If patient is taking a supplement with iron, she can reduce intake. Can repeat in 3-6 to monitor trend.  T3 ratio slightly decreased. Other thyroid panel labs are normal. Will not make any changes at this time.  Cholesterol is elevated. Diet encouraged - increase intake of fresh fruits and vegetables, increase intake of lean proteins. Bake, broil, or grill foods. Avoid fried, greasy, and fatty foods. Avoid fast foods. Increase intake of fiber-rich whole grains. Exercise encouraged - at least 150 minutes per week and advance as tolerated. Can also try red yeast rice and we will recheck in 3 months.  All other labs normal.

## 2023-04-09 ENCOUNTER — Telehealth: Payer: Self-pay | Admitting: Family Medicine

## 2023-04-09 NOTE — Telephone Encounter (Signed)
  Incoming Patient Call  04/09/2023  What symptoms do you have? Right Shoulder pain, warm to touch and swollen  How long have you been sick? Since 04/03/2023  Have you been seen for this problem? Yes 04/03/2023 by Jerrel Ivory was prescribed prednisone and it is not working was told to call back if not better and let her know  If your provider decides to give you a prescription, which pharmacy would you like for it to be sent to? [facility]   Patient informed that this information will be sent to the clinical staff for review and that they should receive a follow up call.

## 2023-04-10 ENCOUNTER — Encounter: Payer: Self-pay | Admitting: Family Medicine

## 2023-04-10 ENCOUNTER — Ambulatory Visit: Payer: Managed Care, Other (non HMO) | Admitting: Family Medicine

## 2023-04-10 VITALS — BP 138/72 | HR 64 | Temp 97.5°F | Ht 64.0 in | Wt 152.0 lb

## 2023-04-10 DIAGNOSIS — M542 Cervicalgia: Secondary | ICD-10-CM

## 2023-04-10 MED ORDER — BETAMETHASONE SOD PHOS & ACET 6 (3-3) MG/ML IJ SUSP
6.0000 mg | Freq: Once | INTRAMUSCULAR | Status: AC
Start: 2023-04-10 — End: 2023-04-10
  Administered 2023-04-10: 6 mg via INTRAMUSCULAR

## 2023-04-10 MED ORDER — TIZANIDINE HCL 4 MG PO TABS: 4.0000 mg | ORAL_TABLET | Freq: Four times a day (QID) | ORAL | 1 refills | Status: DC | PRN

## 2023-04-10 NOTE — Progress Notes (Signed)
Chief Complaint  Patient presents with   Neck Pain    HPI  Patient presents today for 7/01 pain at right posterolateral neck originating at the right nuchal ridge. Radiating down the lateral cervical muscle . Causes numbness in the superior trapezius and sharp pain radiating into the upper right arm. No limit to ROM  PMH: Smoking status noted ROS: Per HPI  Objective: BP 138/72   Pulse 64   Temp (!) 97.5 F (36.4 C)   Ht 5\' 4"  (1.626 m)   Wt 152 lb (68.9 kg)   SpO2 96%   BMI 26.09 kg/m  Gen: NAD, alert, cooperative with exam HEENT: NCAT, EOMI, PERRL Ext: No edema, warm. Tender at the right lateral neck. FROM at RUE. Somewhat tender at Deltoid area,  Neuro: Alert and oriented, No gross deficits  Assessment and plan:  1. Neck pain     Meds ordered this encounter  Medications   tiZANidine (ZANAFLEX) 4 MG tablet    Sig: Take 1 tablet (4 mg total) by mouth every 6 (six) hours as needed for muscle spasms.    Dispense:  30 tablet    Refill:  1   betamethasone acetate-betamethasone sodium phosphate (CELESTONE) injection 6 mg    Orders Placed This Encounter  Procedures   Ambulatory referral to Physical Therapy    Referral Priority:   Routine    Referral Type:   Physical Medicine    Referral Reason:   Specialty Services Required    Requested Specialty:   Physical Therapy    Number of Visits Requested:   1    Follow up as needed.  Mechele Claude, MD

## 2023-04-10 NOTE — Progress Notes (Signed)
No significant abnormalities. Some degenerative changes (breakdown of bone and cartilage). Please have patient come back in for further evaluation as she is still having symptoms/worsening symptoms.

## 2023-04-10 NOTE — Telephone Encounter (Signed)
Left message for pt to return call and schedule an appt with Jerrel Ivory.

## 2023-04-10 NOTE — Telephone Encounter (Signed)
Appt scheduled for 04/10/23 with Dr. Darlyn Read

## 2023-04-18 ENCOUNTER — Encounter: Payer: Self-pay | Admitting: Family Medicine

## 2023-04-18 ENCOUNTER — Ambulatory Visit: Payer: Managed Care, Other (non HMO)

## 2023-04-18 ENCOUNTER — Ambulatory Visit: Payer: Managed Care, Other (non HMO) | Admitting: Family Medicine

## 2023-04-18 VITALS — BP 121/74 | HR 78 | Temp 98.3°F | Ht 64.0 in | Wt 151.0 lb

## 2023-04-18 DIAGNOSIS — J019 Acute sinusitis, unspecified: Secondary | ICD-10-CM | POA: Diagnosis not present

## 2023-04-18 DIAGNOSIS — B9689 Other specified bacterial agents as the cause of diseases classified elsewhere: Secondary | ICD-10-CM

## 2023-04-18 MED ORDER — DOXYCYCLINE HYCLATE 100 MG PO TABS
100.0000 mg | ORAL_TABLET | Freq: Two times a day (BID) | ORAL | 0 refills | Status: AC
Start: 2023-04-18 — End: 2023-04-25

## 2023-04-18 NOTE — Progress Notes (Signed)
Acute Office Visit  Subjective:  Patient ID: Margaret Dixon, female    DOB: 1961-01-20, 62 y.o.   MRN: [id number]  Chief Complaint  Patient presents with   headache, sinus pressure   HPI Symptoms started one week ago with cough and runny nose. Symptoms worsened 3 days ago with increased rhinorrhea and blood in her rhinorrhea, increased sinus pressure, ear pressure.  Increased headaches. States that bending over to put on shoes made her so dizzy that she almost passed out. She is supposed to start PT for her shoulder today and is worried she will not be able to complete it. She has been trying zyrtec, nasal sprays at home and symptoms have not improved but worsened. She works a [employer] and is worried about exposing residents.   ROS As per HPI  Objective:  BP 121/74   Pulse 78   Temp 98.3 F (36.8 C)   Ht 5\' 4"  (1.626 m)   Wt 151 lb (68.5 kg)   SpO2 94%   BMI 25.92 kg/m   Physical Exam Constitutional:      General: She is awake. She is not in acute distress.    Appearance: Normal appearance. She is well-developed, well-groomed and overweight. She is not ill-appearing, toxic-appearing or diaphoretic.  HENT:     Head:     Salivary Glands: Right salivary gland is not diffusely enlarged or tender. Left salivary gland is not diffusely enlarged or tender.     Right Ear: Swelling present. A middle ear effusion is present. Tympanic membrane is injected and erythematous. Tympanic membrane is not scarred or bulging.     Left Ear: Swelling and tenderness present. A middle ear effusion is present. Tympanic membrane is injected and erythematous. Tympanic membrane is not scarred or bulging.     Nose: Congestion and rhinorrhea present. No nasal deformity, septal deviation, signs of injury, laceration, nasal tenderness or mucosal edema. Rhinorrhea is clear and bloody.     Right Turbinates: Not enlarged or swollen.     Left Turbinates: Enlarged and swollen.     Right Sinus:  Maxillary sinus tenderness and frontal sinus tenderness present.     Left Sinus: Maxillary sinus tenderness and frontal sinus tenderness present.     Mouth/Throat:     Lips: Pink. No lesions.     Mouth: Mucous membranes are moist.     Tongue: No lesions.     Pharynx: Posterior oropharyngeal erythema present. No pharyngeal swelling or oropharyngeal exudate.     Tonsils: No tonsillar exudate or tonsillar abscesses.  Cardiovascular:     Rate and Rhythm: Normal rate and regular rhythm.     Pulses: Normal pulses.          Radial pulses are 2+ on the right side and 2+ on the left side.       Posterior tibial pulses are 2+ on the right side and 2+ on the left side.     Heart sounds: Normal heart sounds. No murmur heard.    No gallop.  Pulmonary:     Effort: Pulmonary effort is normal. No respiratory distress.     Breath sounds: Normal breath sounds. No stridor. No wheezing, rhonchi or rales.  Musculoskeletal:     Cervical back: Full passive range of motion without pain and neck supple.     Right lower leg: No edema.     Left lower leg: No edema.  Lymphadenopathy:     Head:     Right side  of head: Tonsillar adenopathy present. No submental, submandibular, preauricular or posterior auricular adenopathy.     Left side of head: No submental, submandibular, tonsillar, preauricular or posterior auricular adenopathy.     Cervical:     Right cervical: No superficial cervical adenopathy.    Left cervical: No superficial cervical adenopathy.  Skin:    General: Skin is warm.     Capillary Refill: Capillary refill takes less than 2 seconds.  Neurological:     General: No focal deficit present.     Mental Status: She is alert, oriented to person, place, and time and easily aroused. Mental status is at baseline.     GCS: GCS eye subscore is 4. GCS verbal subscore is 5. GCS motor subscore is 6.     Motor: No weakness.  Psychiatric:        Attention and Perception: Attention and perception normal.         Mood and Affect: Mood and affect normal.        Speech: Speech normal.        Behavior: Behavior normal. Behavior is cooperative.        Thought Content: Thought content normal. Thought content does not include homicidal or suicidal ideation. Thought content does not include homicidal or suicidal plan.        Cognition and Memory: Cognition and memory normal.        Judgment: Judgment normal.       04/18/2023    2:30 PM 04/10/2023    9:53 AM 04/03/2023    1:13 PM  Depression screen PHQ 2/9  Decreased Interest 0 0 0  Down, Depressed, Hopeless 0 0 0  PHQ - 2 Score 0 0 0  Altered sleeping 0  0  Tired, decreased energy 0  0  Change in appetite 0  0  Feeling bad or failure about yourself  0  0  Trouble concentrating 0  0  Moving slowly or fidgety/restless 0  0  Suicidal thoughts 0  0  PHQ-9 Score 0  0  Difficult doing work/chores Not difficult at all  Not difficult at all      04/18/2023    2:31 PM 04/03/2023    1:13 PM  GAD 7 : Generalized Anxiety Score  Nervous, Anxious, on Edge 0 0  Control/stop worrying 0 0  Worry too much - different things 0 0  Trouble relaxing 0 0  Restless 0 0  Easily annoyed or irritable 0 0  Afraid - awful might happen 0 0  Total GAD 7 Score 0 0  Anxiety Difficulty Not difficult at all Not difficult at all   Assessment & Plan:  1. Acute bacterial rhinosinusitis Medication as below. Discussed continuing supportive treatment at home and return precautions.  - doxycycline (VIBRA-TABS) 100 MG tablet; Take 1 tablet (100 mg total) by mouth 2 (two) times daily for 7 days.  Dispense: 14 tablet; Refill: 0  The above assessment and management plan was discussed with the patient. The patient verbalized understanding of and has agreed to the management plan using shared-decision making. Patient is aware to call the clinic if they develop any new symptoms or if symptoms fail to improve or worsen. Patient is aware when to return to the clinic for a follow-up  visit. Patient educated on when it is appropriate to go to the emergency department.   Return if symptoms worsen or fail to improve.  Neale Burly, DNP-FNP [facility] [address] [place],  [place] [address] [phone]

## 2023-04-18 NOTE — Patient Instructions (Signed)
It appears that you have a viral upper respiratory infection (cold).  Cold symptoms can last up to 2 weeks.   ? ?- Get plenty of rest and drink plenty of fluids. ?- Try to breathe moist air. Use a cold mist humidifier. ?- Consume warm fluids (soup or tea) to provide relief for a stuffy nose and to loosen phlegm. ?- For nasal stuffiness, try saline nasal spray or a Neti Pot. Afrin nasal spray can also be used but this product should not be used longer than 3 days or it will cause rebound nasal stuffiness (worsening nasal congestion). ?- For sore throat pain relief: use chloraseptic spray, suck on throat lozenges, hard candy or popsicles; gargle with warm salt water (1/4 tsp. salt per 8 oz. of water); and eat soft, bland foods. ?- Eat a well-balanced diet. If you cannot, ensure you are getting enough nutrients by taking a daily multivitamin. ?- Avoid dairy products, as they can thicken phlegm. ?- Avoid alcohol, as it impairs your body?s immune system. ? ?CONTACT YOUR DOCTOR IF YOU EXPERIENCE ANY OF THE FOLLOWING: ?- High fever ?- Ear pain ?- Sinus-type headache ?- Unusually severe cold symptoms ?- Cough that gets worse while other cold symptoms improve ?- Flare up of any chronic lung problem, such as asthma ?- Your symptoms persist longer than 2 weeks ? ? ?

## 2023-04-19 ENCOUNTER — Ambulatory Visit (INDEPENDENT_AMBULATORY_CARE_PROVIDER_SITE_OTHER): Payer: Managed Care, Other (non HMO)

## 2023-04-19 DIAGNOSIS — E538 Deficiency of other specified B group vitamins: Secondary | ICD-10-CM | POA: Diagnosis not present

## 2023-04-19 MED ORDER — CYANOCOBALAMIN 1000 MCG/ML IJ SOLN
1000.0000 ug | INTRAMUSCULAR | Status: AC
Start: 2023-04-19 — End: ?
  Administered 2023-04-19 – 2024-10-01 (×9): 1000 ug via INTRAMUSCULAR

## 2023-04-19 NOTE — Progress Notes (Signed)
Per Jerrel Ivory it is okay to re start b12 injection every 2 weeks.   B12 given to patient and tolerated well

## 2023-04-22 ENCOUNTER — Other Ambulatory Visit: Payer: Self-pay

## 2023-04-22 ENCOUNTER — Ambulatory Visit: Payer: Managed Care, Other (non HMO) | Attending: Family Medicine

## 2023-04-22 DIAGNOSIS — R208 Other disturbances of skin sensation: Secondary | ICD-10-CM | POA: Diagnosis present

## 2023-04-22 DIAGNOSIS — M5412 Radiculopathy, cervical region: Secondary | ICD-10-CM | POA: Insufficient documentation

## 2023-04-22 DIAGNOSIS — M542 Cervicalgia: Secondary | ICD-10-CM | POA: Diagnosis not present

## 2023-04-22 NOTE — Therapy (Signed)
OUTPATIENT PHYSICAL THERAPY CERVICAL EVALUATION   Patient Name: Margaret Dixon MRN: [id number] DOB:07/15/1961, 62 y.o., female Today's Date: 04/22/2023  END OF SESSION:  PT End of Session - 04/22/23 1111     Visit Number 1    Number of Visits 8    Date for PT Re-Evaluation 06/21/23    PT Start Time 1111    PT Stop Time 1200    PT Time Calculation (min) 49 min    Activity Tolerance Patient tolerated treatment well    Behavior During Therapy [facility] for tasks assessed/performed             Past Medical History:  Diagnosis Date   Constipation    Depression    due to Fibromyalgia   Diarrhea    Fibromyalgia    Hormone replacement therapy, postmenopausal    PONV (postoperative nausea and vomiting)    Past Surgical History:  Procedure Laterality Date   ABDOMINAL ADHESION SURGERY  07/28/2015   APPENDECTOMY     CHOLECYSTECTOMY     COLONOSCOPY     CORONARY STENT INTERVENTION N/A 12/04/2021   Procedure: CORONARY STENT INTERVENTION;  Surgeon: Swaziland, Peter M, MD;  Location: [facility];  Service: Cardiovascular;  Laterality: N/A;  circumflex   INCISION AND DRAINAGE ABSCESS N/A 11/03/2015   Procedure: EXCISION CHRONIC ABDOMINAL WALL STITCH ABSCESS;  Surgeon: Abigail Miyamoto, MD;  Location: [facility];  Service: General;  Laterality: N/A;   LAPAROTOMY N/A 07/28/2015   Procedure: EXPLORATORY LAPAROTOMY;  Surgeon: Abigail Miyamoto, MD;  Location: [facility] OR;  Service: General;  Laterality: N/A;   LEFT HEART CATH AND CORONARY ANGIOGRAPHY N/A 12/04/2021   Procedure: LEFT HEART CATH AND CORONARY ANGIOGRAPHY;  Surgeon: Swaziland, Peter M, MD;  Location: [facility];  Service: Cardiovascular;  Laterality: N/A;   LYSIS OF ADHESION N/A 07/28/2015   Procedure: LYSIS OF ADHESION;  Surgeon: Abigail Miyamoto, MD;  Location: [facility] OR;  Service: General;  Laterality: N/A;   TONSILLECTOMY     VAGINAL HYSTERECTOMY     Patient Active Problem List   Diagnosis Date Noted    Prediabetes 04/03/2023   Vitamin B12 deficiency 04/03/2023   Former tobacco use 04/03/2023   Eustachian tube dysfunction, bilateral 04/03/2023   Menopause 12/04/2021   Thyroid dysfunction 12/04/2021   ACS (acute coronary syndrome) (HCC) 12/02/2021   NSTEMI (non-ST elevated myocardial infarction) (HCC) 12/02/2021   Vitamin D deficiency 09/26/2015   S/P exploratory laparotomy 07/28/2015   Dyslipidemia 05/23/2015   Anemia due to vitamin B12 deficiency 05/23/2015   Fibromyalgia 04/29/2014   CAD (coronary artery disease) 02/25/2012   Tobacco abuse 02/25/2012   Hyperlipidemia 02/25/2012   REFERRING PROVIDER: Mechele Claude, MD   REFERRING DIAG: Neck pain   THERAPY DIAG:  Radiculopathy, cervical region  Other disturbances of skin sensation  Rationale for Evaluation and Treatment: Rehabilitation  ONSET DATE: 2 weeks ago  SUBJECTIVE:  SUBJECTIVE STATEMENT: Patient that she began having pain in the back of her neck and it has been going down her right arm. She is unsure what caused this pain to start, and it has been getting worse since it began. She has been having numbness down her right arm into her hand. Hand dominance: Right  PERTINENT HISTORY:  CAD, history of MI, fibromyalgia, current smoker, and depression  PAIN:  Are you having pain? Yes: NPRS scale: 8-9/10 Pain location: neck and right arm Pain description: intermittent throbbing and numbness Aggravating factors: pushing, pulling, lifting, turning her head Relieving factors: alternating heat and ice, and medication  PRECAUTIONS: None  RED FLAGS: None    WEIGHT BEARING RESTRICTIONS: No  FALLS:  Has patient fallen in last 6 months? No  LIVING ENVIRONMENT: Lives with: lives alone Lives in: House/apartment Has  following equipment at home: None  OCCUPATION: CNA  PLOF: Independent  PATIENT GOALS: reduced pain and numbness, be able to do her critical job demands without pain, and be able to sleep better (3-4 hours at most)   NEXT MD VISIT: 07/12/23  OBJECTIVE:   DIAGNOSTIC FINDINGS: 04/03/23 cervical x-ray IMPRESSION: 1. No acute findings. 2. Degenerative changes.  COGNITION: Overall cognitive status: Within functional limits for tasks assessed  SENSATION: Light touch: Impaired with diminished sensation at C5, C8-T1 on the right upper extremity Patient reports numbness into her right hand currently.   PALPATION: TTP: right upper trapezius, cervical paraspinals, and supraspinatus   CERVICAL ROM:   Active ROM A/PROM (deg) eval  Flexion 40; neck pain   Extension 18  Right lateral flexion 18; painful   Left lateral flexion 22  Right rotation 44; right sided neck and shoulder pain   Left rotation 44; slight pain   (Blank rows = not tested)  UPPER EXTREMITY ROM: WFL for activities assessed  Active ROM Right eval Left eval  Shoulder flexion Painful    Shoulder extension    Shoulder abduction    Shoulder adduction    Shoulder extension    Shoulder internal rotation    Shoulder external rotation    Elbow flexion Right shoulder pain   Elbow extension    Wrist flexion    Wrist extension    Wrist ulnar deviation    Wrist radial deviation    Wrist pronation    Wrist supination     (Blank rows = not tested)  UPPER EXTREMITY MMT:  MMT Right eval Left eval  Shoulder flexion 3/5; limited by pain 3/5  Shoulder extension    Shoulder abduction Unable to assess; limited by pain    Shoulder adduction    Shoulder extension    Shoulder internal rotation    Shoulder external rotation    Middle trapezius    Lower trapezius    Elbow flexion    Elbow extension    Wrist flexion    Wrist extension    Wrist ulnar deviation    Wrist radial deviation    Wrist pronation    Wrist  supination    Grip strength 15 40   (Blank rows = not tested)  CERVICAL SPECIAL TESTS:  Spurling's test: Positive, Distraction test: Positive, and Sharp pursor's test: Negative  TODAY'S TREATMENT:  DATE:    PATIENT EDUCATION:  Education details: POC, healing, prognosis, anatomy, x-ray results, and goals for therapy Person educated: Patient Education method: Explanation Education comprehension: verbalized understanding  HOME EXERCISE PROGRAM:   ASSESSMENT:  CLINICAL IMPRESSION: Patient is a 62 y.o. female who was seen today for physical therapy evaluation and treatment for right cervical radiculopathy. She presented with high pain severity and irritability which limited her ability to complete upper extremity manual muscle testing. She exhibited reduced cervical active range of motion with pain being her primary limiting factor. She also exhibited reduced sensation in various dermatomes throughout her right upper extremity. Recommend that she continue with skilled physical therapy to address her impairments to return to her prior level of function.    OBJECTIVE IMPAIRMENTS: decreased activity tolerance, decreased ROM, decreased strength, hypomobility, impaired sensation, impaired tone, impaired UE functional use, and pain.   ACTIVITY LIMITATIONS: carrying, lifting, reach over head, and caring for others  PARTICIPATION LIMITATIONS: meal prep, cleaning, laundry, shopping, community activity, and occupation  PERSONAL FACTORS: Profession and 3+ comorbidities: CAD, history of MI, fibromyalgia, current smoker, and depression  are also affecting patient's functional outcome.   REHAB POTENTIAL: Fair    CLINICAL DECISION MAKING: Evolving/moderate complexity  EVALUATION COMPLEXITY: Moderate   GOALS: Goals reviewed with patient? Yes  LONG TERM GOALS: Target date:  05/20/23  Patient will be independent with her HEP.  Baseline:  Goal status: INITIAL  2.  Patient will be able to complete  her critical job demands without her familiar pain exceeding 5/10. Baseline:  Goal status: INITIAL  3.  Patient will be able to demonstrate at least 60 degrees of cervical rotation bilaterally for improved awareness of her surroundings.  Baseline:  Goal status: INITIAL  4.  Patient will improve her right hand grip strength to at least 35 pounds for improved function carrying groceries with he right upper extremity.  Baseline:  Goal status: INITIAL  5.  Patient will report being able to sleep at least 6 hours without being awakened by her familiar cervical symptoms.  Baseline:  Goal status: INITIAL  PLAN:  PT FREQUENCY: 2x/week  PT DURATION: 4 weeks  PLANNED INTERVENTIONS: Therapeutic exercises, Therapeutic activity, Neuromuscular re-education, Patient/Family education, Self Care, Joint mobilization, Dry Needling, Electrical stimulation, Spinal manipulation, Spinal mobilization, Cryotherapy, Moist heat, Traction, Ultrasound, Manual therapy, and Re-evaluation  PLAN FOR NEXT SESSION: scapular retraction, chin tucks, SNAG's, manual therapy/cervical traction, and modalities as needed   Granville Lewis, PT 04/22/2023, 3:30 PM

## 2023-04-25 ENCOUNTER — Ambulatory Visit: Payer: Managed Care, Other (non HMO) | Admitting: *Deleted

## 2023-04-25 DIAGNOSIS — M5412 Radiculopathy, cervical region: Secondary | ICD-10-CM

## 2023-04-25 DIAGNOSIS — R208 Other disturbances of skin sensation: Secondary | ICD-10-CM

## 2023-04-25 NOTE — Therapy (Signed)
OUTPATIENT PHYSICAL THERAPY CERVICAL EVALUATION   Patient Name: Margaret Dixon MRN: [id number] DOB:07-May-1961, 62 y.o., female Today's Date: 04/25/2023  END OF SESSION:  PT End of Session - 04/25/23 1550     Visit Number 2    Number of Visits 8    Date for PT Re-Evaluation 06/21/23    PT Start Time 1550    PT Stop Time 1639    PT Time Calculation (min) 49 min             Past Medical History:  Diagnosis Date   Constipation    Depression    due to Fibromyalgia   Diarrhea    Fibromyalgia    Hormone replacement therapy, postmenopausal    PONV (postoperative nausea and vomiting)    Past Surgical History:  Procedure Laterality Date   ABDOMINAL ADHESION SURGERY  07/28/2015   APPENDECTOMY     CHOLECYSTECTOMY     COLONOSCOPY     CORONARY STENT INTERVENTION N/A 12/04/2021   Procedure: CORONARY STENT INTERVENTION;  Surgeon: Swaziland, Peter M, MD;  Location: [facility];  Service: Cardiovascular;  Laterality: N/A;  circumflex   INCISION AND DRAINAGE ABSCESS N/A 11/03/2015   Procedure: EXCISION CHRONIC ABDOMINAL WALL STITCH ABSCESS;  Surgeon: Abigail Miyamoto, MD;  Location: [facility];  Service: General;  Laterality: N/A;   LAPAROTOMY N/A 07/28/2015   Procedure: EXPLORATORY LAPAROTOMY;  Surgeon: Abigail Miyamoto, MD;  Location: [facility] OR;  Service: General;  Laterality: N/A;   LEFT HEART CATH AND CORONARY ANGIOGRAPHY N/A 12/04/2021   Procedure: LEFT HEART CATH AND CORONARY ANGIOGRAPHY;  Surgeon: Swaziland, Peter M, MD;  Location: [facility];  Service: Cardiovascular;  Laterality: N/A;   LYSIS OF ADHESION N/A 07/28/2015   Procedure: LYSIS OF ADHESION;  Surgeon: Abigail Miyamoto, MD;  Location: [facility] OR;  Service: General;  Laterality: N/A;   TONSILLECTOMY     VAGINAL HYSTERECTOMY     Patient Active Problem List   Diagnosis Date Noted   Prediabetes 04/03/2023   Vitamin B12 deficiency 04/03/2023   Former tobacco use 04/03/2023   Eustachian tube dysfunction,  bilateral 04/03/2023   Menopause 12/04/2021   Thyroid dysfunction 12/04/2021   ACS (acute coronary syndrome) (HCC) 12/02/2021   NSTEMI (non-ST elevated myocardial infarction) (HCC) 12/02/2021   Vitamin D deficiency 09/26/2015   S/P exploratory laparotomy 07/28/2015   Dyslipidemia 05/23/2015   Anemia due to vitamin B12 deficiency 05/23/2015   Fibromyalgia 04/29/2014   CAD (coronary artery disease) 02/25/2012   Tobacco abuse 02/25/2012   Hyperlipidemia 02/25/2012   REFERRING PROVIDER: Mechele Claude, MD   REFERRING DIAG: Neck pain   THERAPY DIAG:  Radiculopathy, cervical region  Other disturbances of skin sensation  Rationale for Evaluation and Treatment: Rehabilitation  ONSET DATE: 2 weeks ago  SUBJECTIVE:  SUBJECTIVE STATEMENT: Pt reports RT sided neck pain with numbness in to RT UE Hand dominance: Right  PERTINENT HISTORY:  CAD, history of MI, fibromyalgia, current smoker, and depression  PAIN:  Are you having pain? Yes: NPRS scale: 8-9/10 Pain location: neck and right arm Pain description: intermittent throbbing and numbness Aggravating factors: pushing, pulling, lifting, turning her head Relieving factors: alternating heat and ice, and medication  PRECAUTIONS: None  RED FLAGS: None    WEIGHT BEARING RESTRICTIONS: No  FALLS:  Has patient fallen in last 6 months? No  LIVING ENVIRONMENT: Lives with: lives alone Lives in: House/apartment Has following equipment at home: None  OCCUPATION: CNA  PLOF: Independent  PATIENT GOALS: reduced pain and numbness, be able to do her critical job demands without pain, and be able to sleep better (3-4 hours at most)   NEXT MD VISIT: 07/12/23  OBJECTIVE:   DIAGNOSTIC FINDINGS: 04/03/23 cervical x-ray IMPRESSION: 1. No  acute findings. 2. Degenerative changes.  COGNITION: Overall cognitive status: Within functional limits for tasks assessed  SENSATION: Light touch: Impaired with diminished sensation at C5, C8-T1 on the right upper extremity Patient reports numbness into her right hand currently.   PALPATION: TTP: right upper trapezius, cervical paraspinals, and supraspinatus   CERVICAL ROM:   Active ROM A/PROM (deg) eval  Flexion 40; neck pain   Extension 18  Right lateral flexion 18; painful   Left lateral flexion 22  Right rotation 44; right sided neck and shoulder pain   Left rotation 44; slight pain   (Blank rows = not tested)  UPPER EXTREMITY ROM: WFL for activities assessed  Active ROM Right eval Left eval  Shoulder flexion Painful    Shoulder extension    Shoulder abduction    Shoulder adduction    Shoulder extension    Shoulder internal rotation    Shoulder external rotation    Elbow flexion Right shoulder pain   Elbow extension    Wrist flexion    Wrist extension    Wrist ulnar deviation    Wrist radial deviation    Wrist pronation    Wrist supination     (Blank rows = not tested)  UPPER EXTREMITY MMT:  MMT Right eval Left eval  Shoulder flexion 3/5; limited by pain 3/5  Shoulder extension    Shoulder abduction Unable to assess; limited by pain    Shoulder adduction    Shoulder extension    Shoulder internal rotation    Shoulder external rotation    Middle trapezius    Lower trapezius    Elbow flexion    Elbow extension    Wrist flexion    Wrist extension    Wrist ulnar deviation    Wrist radial deviation    Wrist pronation    Wrist supination    Grip strength 15 40   (Blank rows = not tested)  CERVICAL SPECIAL TESTS:  Spurling's test: Positive, Distraction test: Positive, and Sharp pursor's test: Negative  TODAY'S TREATMENT:  DATE:                                       04-25-23 [place] estim combo x 12 mins 1.5 w/cm2 to RT side cerv paras and UT Manual: STW and TPR to RT UT as well as cerv. Paras. IFC x15 mins 80-150hz   to RT side UT/cerv area                                    EXERCISE LOG  Exercise Repetitions and Resistance Comments  Chin tucks X 10  Given for HEP                   Blank cell = exercise not performed today   PATIENT EDUCATION:  Education details: POC, healing, prognosis, anatomy, x-ray results, and goals for therapy Person educated: Patient Education method: Explanation Education comprehension: verbalized understanding  HOME EXERCISE PROGRAM:   ASSESSMENT:  CLINICAL IMPRESSION:  Pt arrived today with RT sided neck pain and numbness into RT UE. Pt instructed in chin tucks f/b [place] combo and STW to decrease mm  tension and pain. Notable TPs in UT and cerv paras, but with good release from rx. IFC and HMP end of session.   OBJECTIVE IMPAIRMENTS: decreased activity tolerance, decreased ROM, decreased strength, hypomobility, impaired sensation, impaired tone, impaired UE functional use, and pain.   ACTIVITY LIMITATIONS: carrying, lifting, reach over head, and caring for others  PARTICIPATION LIMITATIONS: meal prep, cleaning, laundry, shopping, community activity, and occupation  PERSONAL FACTORS: Profession and 3+ comorbidities: CAD, history of MI, fibromyalgia, current smoker, and depression  are also affecting patient's functional outcome.   REHAB POTENTIAL: Fair    CLINICAL DECISION MAKING: Evolving/moderate complexity  EVALUATION COMPLEXITY: Moderate   GOALS: Goals reviewed with patient? Yes  LONG TERM GOALS: Target date: 05/20/23  Patient will be independent with her HEP.  Baseline:  Goal status: INITIAL  2.  Patient will be able to complete  her critical job demands without her familiar pain exceeding 5/10. Baseline:  Goal status: INITIAL  3.  Patient will be able  to demonstrate at least 60 degrees of cervical rotation bilaterally for improved awareness of her surroundings.  Baseline:  Goal status: INITIAL  4.  Patient will improve her right hand grip strength to at least 35 pounds for improved function carrying groceries with he right upper extremity.  Baseline:  Goal status: INITIAL  5.  Patient will report being able to sleep at least 6 hours without being awakened by her familiar cervical symptoms.  Baseline:  Goal status: INITIAL  PLAN:  PT FREQUENCY: 2x/week  PT DURATION: 4 weeks  PLANNED INTERVENTIONS: Therapeutic exercises, Therapeutic activity, Neuromuscular re-education, Patient/Family education, Self Care, Joint mobilization, Dry Needling, Electrical stimulation, Spinal manipulation, Spinal mobilization, Cryotherapy, Moist heat, Traction, Ultrasound, Manual therapy, and Re-evaluation  PLAN FOR NEXT SESSION: scapular retraction, chin tucks, SNAG's, manual therapy/cervical traction, and modalities as needed   Karlena Luebke,CHRIS, PTA 04/25/2023, 5:02 PM

## 2023-04-30 ENCOUNTER — Ambulatory Visit: Payer: Managed Care, Other (non HMO) | Attending: Family Medicine | Admitting: *Deleted

## 2023-04-30 DIAGNOSIS — R208 Other disturbances of skin sensation: Secondary | ICD-10-CM | POA: Insufficient documentation

## 2023-04-30 DIAGNOSIS — M5412 Radiculopathy, cervical region: Secondary | ICD-10-CM | POA: Diagnosis present

## 2023-04-30 NOTE — Therapy (Signed)
OUTPATIENT PHYSICAL THERAPY CERVICAL TREATMENT   Patient Name: Margaret Dixon MRN: [id number] DOB:July 23, 1961, 62 y.o., female Today's Date: 04/30/2023  END OF SESSION:  PT End of Session - 04/30/23 1806     Visit Number 3    Number of Visits 8    Date for PT Re-Evaluation 06/21/23    PT Start Time 1608             Past Medical History:  Diagnosis Date   Constipation    Depression    due to Fibromyalgia   Diarrhea    Fibromyalgia    Hormone replacement therapy, postmenopausal    PONV (postoperative nausea and vomiting)    Past Surgical History:  Procedure Laterality Date   ABDOMINAL ADHESION SURGERY  07/28/2015   APPENDECTOMY     CHOLECYSTECTOMY     COLONOSCOPY     CORONARY STENT INTERVENTION N/A 12/04/2021   Procedure: CORONARY STENT INTERVENTION;  Surgeon: Swaziland, Peter M, MD;  Location: [facility];  Service: Cardiovascular;  Laterality: N/A;  circumflex   INCISION AND DRAINAGE ABSCESS N/A 11/03/2015   Procedure: EXCISION CHRONIC ABDOMINAL WALL STITCH ABSCESS;  Surgeon: Abigail Miyamoto, MD;  Location: [facility];  Service: General;  Laterality: N/A;   LAPAROTOMY N/A 07/28/2015   Procedure: EXPLORATORY LAPAROTOMY;  Surgeon: Abigail Miyamoto, MD;  Location: [facility];  Service: General;  Laterality: N/A;   LEFT HEART CATH AND CORONARY ANGIOGRAPHY N/A 12/04/2021   Procedure: LEFT HEART CATH AND CORONARY ANGIOGRAPHY;  Surgeon: Swaziland, Peter M, MD;  Location: [facility];  Service: Cardiovascular;  Laterality: N/A;   LYSIS OF ADHESION N/A 07/28/2015   Procedure: LYSIS OF ADHESION;  Surgeon: Abigail Miyamoto, MD;  Location: [facility];  Service: General;  Laterality: N/A;   TONSILLECTOMY     VAGINAL HYSTERECTOMY     Patient Active Problem List   Diagnosis Date Noted   Prediabetes 04/03/2023   Vitamin B12 deficiency 04/03/2023   Former tobacco use 04/03/2023   Eustachian tube dysfunction, bilateral 04/03/2023   Menopause 12/04/2021   Thyroid  dysfunction 12/04/2021   ACS (acute coronary syndrome) (HCC) 12/02/2021   NSTEMI (non-ST elevated myocardial infarction) (HCC) 12/02/2021   Vitamin D deficiency 09/26/2015   S/P exploratory laparotomy 07/28/2015   Dyslipidemia 05/23/2015   Anemia due to vitamin B12 deficiency 05/23/2015   Fibromyalgia 04/29/2014   CAD (coronary artery disease) 02/25/2012   Tobacco abuse 02/25/2012   Hyperlipidemia 02/25/2012   REFERRING PROVIDER: Mechele Claude, MD   REFERRING DIAG: Neck pain   THERAPY DIAG:  Other disturbances of skin sensation  Radiculopathy, cervical region  Rationale for Evaluation and Treatment: Rehabilitation  ONSET DATE: 2 weeks ago  SUBJECTIVE:  SUBJECTIVE STATEMENT: Pt reports RT sided neck pain with numbness in to RT UE is less Hand dominance: Right  PERTINENT HISTORY:  CAD, history of MI, fibromyalgia, current smoker, and depression  PAIN:  Are you having pain? Yes: NPRS scale: 5-6/10 Pain location: neck and right arm Pain description: intermittent throbbing and numbness Aggravating factors: pushing, pulling, lifting, turning her head Relieving factors: alternating heat and ice, and medication  PRECAUTIONS: None  RED FLAGS: None    WEIGHT BEARING RESTRICTIONS: No  FALLS:  Has patient fallen in last 6 months? No  LIVING ENVIRONMENT: Lives with: lives alone Lives in: House/apartment Has following equipment at home: None  OCCUPATION: CNA  PLOF: Independent  PATIENT GOALS: reduced pain and numbness, be able to do her critical job demands without pain, and be able to sleep better (3-4 hours at most)   NEXT MD VISIT: 07/12/23  OBJECTIVE:   DIAGNOSTIC FINDINGS: 04/03/23 cervical x-ray IMPRESSION: 1. No acute findings. 2. Degenerative  changes.  COGNITION: Overall cognitive status: Within functional limits for tasks assessed  SENSATION: Light touch: Impaired with diminished sensation at C5, C8-T1 on the right upper extremity Patient reports numbness into her right hand currently.   PALPATION: TTP: right upper trapezius, cervical paraspinals, and supraspinatus   CERVICAL ROM:   Active ROM A/PROM (deg) eval  Flexion 40; neck pain   Extension 18  Right lateral flexion 18; painful   Left lateral flexion 22  Right rotation 44; right sided neck and shoulder pain   Left rotation 44; slight pain   (Blank rows = not tested)  UPPER EXTREMITY ROM: WFL for activities assessed  Active ROM Right eval Left eval  Shoulder flexion Painful    Shoulder extension    Shoulder abduction    Shoulder adduction    Shoulder extension    Shoulder internal rotation    Shoulder external rotation    Elbow flexion Right shoulder pain   Elbow extension    Wrist flexion    Wrist extension    Wrist ulnar deviation    Wrist radial deviation    Wrist pronation    Wrist supination     (Blank rows = not tested)  UPPER EXTREMITY MMT:  MMT Right eval Left eval  Shoulder flexion 3/5; limited by pain 3/5  Shoulder extension    Shoulder abduction Unable to assess; limited by pain    Shoulder adduction    Shoulder extension    Shoulder internal rotation    Shoulder external rotation    Middle trapezius    Lower trapezius    Elbow flexion    Elbow extension    Wrist flexion    Wrist extension    Wrist ulnar deviation    Wrist radial deviation    Wrist pronation    Wrist supination    Grip strength 15 40   (Blank rows = not tested)  CERVICAL SPECIAL TESTS:  Spurling's test: Positive, Distraction test: Positive, and Sharp pursor's test: Negative  TODAY'S TREATMENT:  DATE:                                        04-30-23 [place] estim combo x 12 mins 1.5 w/cm2 to RT side cerv paras and UT Manual: STW and TPR to RT UT as well as cerv. Paras. IFC x15 mins 80-150hz   to RT side UT/cerv area                                    EXERCISE LOG  Exercise Repetitions and Resistance Comments  Chin tucks X 10  Given for HEP                   Blank cell = exercise not performed today   PATIENT EDUCATION:  Education details: POC, healing, prognosis, anatomy, x-ray results, and goals for therapy Person educated: Patient Education method: Explanation Education comprehension: verbalized understanding  HOME EXERCISE PROGRAM:   ASSESSMENT:  CLINICAL IMPRESSION:  Pt arrived today with RT sided neck pain and numbness into RT UE still , but less and feels that last Rx helped. Pt did well with  [place] combo and STW tightness and Tps. Good release again in UT as well as Cerv paras.  IFC and HMP end of session.   OBJECTIVE IMPAIRMENTS: decreased activity tolerance, decreased ROM, decreased strength, hypomobility, impaired sensation, impaired tone, impaired UE functional use, and pain.   ACTIVITY LIMITATIONS: carrying, lifting, reach over head, and caring for others  PARTICIPATION LIMITATIONS: meal prep, cleaning, laundry, shopping, community activity, and occupation  PERSONAL FACTORS: Profession and 3+ comorbidities: CAD, history of MI, fibromyalgia, current smoker, and depression  are also affecting patient's functional outcome.   REHAB POTENTIAL: Fair    CLINICAL DECISION MAKING: Evolving/moderate complexity  EVALUATION COMPLEXITY: Moderate   GOALS: Goals reviewed with patient? Yes  LONG TERM GOALS: Target date: 05/20/23  Patient will be independent with her HEP.  Baseline:  Goal status: INITIAL  2.  Patient will be able to complete  her critical job demands without her familiar pain exceeding 5/10. Baseline:  Goal status: INITIAL  3.  Patient will be able to demonstrate at least 60 degrees  of cervical rotation bilaterally for improved awareness of her surroundings.  Baseline:  Goal status: INITIAL  4.  Patient will improve her right hand grip strength to at least 35 pounds for improved function carrying groceries with he right upper extremity.  Baseline:  Goal status: INITIAL  5.  Patient will report being able to sleep at least 6 hours without being awakened by her familiar cervical symptoms.  Baseline:  Goal status: INITIAL  PLAN:  PT FREQUENCY: 2x/week  PT DURATION: 4 weeks  PLANNED INTERVENTIONS: Therapeutic exercises, Therapeutic activity, Neuromuscular re-education, Patient/Family education, Self Care, Joint mobilization, Dry Needling, Electrical stimulation, Spinal manipulation, Spinal mobilization, Cryotherapy, Moist heat, Traction, Ultrasound, Manual therapy, and Re-evaluation  PLAN FOR NEXT SESSION: scapular retraction, chin tucks, SNAG's, manual therapy/cervical traction, and modalities as needed   Malva Diesing,CHRIS, PTA 04/30/2023, 6:08 PM

## 2023-05-03 ENCOUNTER — Ambulatory Visit (INDEPENDENT_AMBULATORY_CARE_PROVIDER_SITE_OTHER): Payer: Managed Care, Other (non HMO)

## 2023-05-03 DIAGNOSIS — E538 Deficiency of other specified B group vitamins: Secondary | ICD-10-CM | POA: Diagnosis not present

## 2023-05-03 NOTE — Progress Notes (Signed)
Cyanocobalamin injection given to right deltoid.  Patient tolerated well. 

## 2023-05-07 ENCOUNTER — Ambulatory Visit: Payer: Managed Care, Other (non HMO) | Admitting: *Deleted

## 2023-05-07 DIAGNOSIS — R208 Other disturbances of skin sensation: Secondary | ICD-10-CM

## 2023-05-07 DIAGNOSIS — M5412 Radiculopathy, cervical region: Secondary | ICD-10-CM

## 2023-05-07 NOTE — Therapy (Signed)
OUTPATIENT PHYSICAL THERAPY CERVICAL TREATMENT   Patient Name: Margaret Dixon MRN: [id number] DOB:07/25/1961, 62 y.o., female Today's Date: 05/07/2023  END OF SESSION:  PT End of Session - 05/07/23 1711     Visit Number 4    Number of Visits 8    Date for PT Re-Evaluation 06/21/23    PT Start Time 1600    PT Stop Time 1651    PT Time Calculation (min) 51 min              Past Medical History:  Diagnosis Date   Constipation    Depression    due to Fibromyalgia   Diarrhea    Fibromyalgia    Hormone replacement therapy, postmenopausal    PONV (postoperative nausea and vomiting)    Past Surgical History:  Procedure Laterality Date   ABDOMINAL ADHESION SURGERY  07/28/2015   APPENDECTOMY     CHOLECYSTECTOMY     COLONOSCOPY     CORONARY STENT INTERVENTION N/A 12/04/2021   Procedure: CORONARY STENT INTERVENTION;  Surgeon: Swaziland, Peter M, MD;  Location: [facility];  Service: Cardiovascular;  Laterality: N/A;  circumflex   INCISION AND DRAINAGE ABSCESS N/A 11/03/2015   Procedure: EXCISION CHRONIC ABDOMINAL WALL STITCH ABSCESS;  Surgeon: Abigail Miyamoto, MD;  Location: [facility];  Service: General;  Laterality: N/A;   LAPAROTOMY N/A 07/28/2015   Procedure: EXPLORATORY LAPAROTOMY;  Surgeon: Abigail Miyamoto, MD;  Location: [facility] OR;  Service: General;  Laterality: N/A;   LEFT HEART CATH AND CORONARY ANGIOGRAPHY N/A 12/04/2021   Procedure: LEFT HEART CATH AND CORONARY ANGIOGRAPHY;  Surgeon: Swaziland, Peter M, MD;  Location: [facility] IN[facility];  Service: Cardiovascular;  Laterality: N/A;   LYSIS OF ADHESION N/A 07/28/2015   Procedure: LYSIS OF ADHESION;  Surgeon: Abigail Miyamoto, MD;  Location: [facility] OR;  Service: General;  Laterality: N/A;   TONSILLECTOMY     VAGINAL HYSTERECTOMY     Patient Active Problem List   Diagnosis Date Noted   Prediabetes 04/03/2023   Vitamin B12 deficiency 04/03/2023   Former tobacco use 04/03/2023   Eustachian tube dysfunction,  bilateral 04/03/2023   Menopause 12/04/2021   Thyroid dysfunction 12/04/2021   ACS (acute coronary syndrome) (HCC) 12/02/2021   NSTEMI (non-ST elevated myocardial infarction) (HCC) 12/02/2021   Vitamin D deficiency 09/26/2015   S/P exploratory laparotomy 07/28/2015   Dyslipidemia 05/23/2015   Anemia due to vitamin B12 deficiency 05/23/2015   Fibromyalgia 04/29/2014   CAD (coronary artery disease) 02/25/2012   Tobacco abuse 02/25/2012   Hyperlipidemia 02/25/2012   REFERRING PROVIDER: Mechele Claude, MD   REFERRING DIAG: Neck pain   THERAPY DIAG:  Other disturbances of skin sensation  Radiculopathy, cervical region  Rationale for Evaluation and Treatment: Rehabilitation  ONSET DATE: 2 weeks ago  SUBJECTIVE:  SUBJECTIVE STATEMENT: Pt reports RT sided neck pain with numbness in RT hand 4-5/10. Had increased pain today RT side and felt a pull/ strain  Hand dominance: Right  PERTINENT HISTORY:  CAD, history of MI, fibromyalgia, current smoker, and depression  PAIN:  Are you having pain? Yes: NPRS scale: 5-6/10 Pain location: neck and right arm Pain description: intermittent throbbing and numbness Aggravating factors: pushing, pulling, lifting, turning her head Relieving factors: alternating heat and ice, and medication  PRECAUTIONS: None  RED FLAGS: None    WEIGHT BEARING RESTRICTIONS: No  FALLS:  Has patient fallen in last 6 months? No  LIVING ENVIRONMENT: Lives with: lives alone Lives in: House/apartment Has following equipment at home: None  OCCUPATION: CNA  PLOF: Independent  PATIENT GOALS: reduced pain and numbness, be able to do her critical job demands without pain, and be able to sleep better (3-4 hours at most)   NEXT MD VISIT: 07/12/23  OBJECTIVE:    DIAGNOSTIC FINDINGS: 04/03/23 cervical x-ray IMPRESSION: 1. No acute findings. 2. Degenerative changes.  COGNITION: Overall cognitive status: Within functional limits for tasks assessed  SENSATION: Light touch: Impaired with diminished sensation at C5, C8-T1 on the right upper extremity Patient reports numbness into her right hand currently.   PALPATION: TTP: right upper trapezius, cervical paraspinals, and supraspinatus   CERVICAL ROM:   Active ROM A/PROM (deg) eval  Flexion 40; neck pain   Extension 18  Right lateral flexion 18; painful   Left lateral flexion 22  Right rotation 44; right sided neck and shoulder pain   Left rotation 44; slight pain   (Blank rows = not tested)  UPPER EXTREMITY ROM: WFL for activities assessed  Active ROM Right eval Left eval  Shoulder flexion Painful    Shoulder extension    Shoulder abduction    Shoulder adduction    Shoulder extension    Shoulder internal rotation    Shoulder external rotation    Elbow flexion Right shoulder pain   Elbow extension    Wrist flexion    Wrist extension    Wrist ulnar deviation    Wrist radial deviation    Wrist pronation    Wrist supination     (Blank rows = not tested)  UPPER EXTREMITY MMT:  MMT Right eval Left eval  Shoulder flexion 3/5; limited by pain 3/5  Shoulder extension    Shoulder abduction Unable to assess; limited by pain    Shoulder adduction    Shoulder extension    Shoulder internal rotation    Shoulder external rotation    Middle trapezius    Lower trapezius    Elbow flexion    Elbow extension    Wrist flexion    Wrist extension    Wrist ulnar deviation    Wrist radial deviation    Wrist pronation    Wrist supination    Grip strength 15 40   (Blank rows = not tested)  CERVICAL SPECIAL TESTS:  Spurling's test: Positive, Distraction test: Positive, and Sharp pursor's test: Negative  TODAY'S TREATMENT:  DATE:                                       05-07-23 [place] estim combo x 12 mins 1.5 w/cm2 to RT side cerv paras and UT, Levator Manual: STW and TPR to RT UT, but unable to tol TPR on cerv. Paras . RT levator light STW , very tender  IFC x15 mins 80-150hz   to RT side UT/cerv area                                    EXERCISE LOG  Exercise Repetitions and Resistance Comments  Chin tucks X 10  Given for HEP                   Blank cell = exercise not performed today   PATIENT EDUCATION:  Education details: POC, healing, prognosis, anatomy, x-ray results, and goals for therapy Person educated: Patient Education method: Explanation Education comprehension: verbalized understanding  HOME EXERCISE PROGRAM:   ASSESSMENT:  CLINICAL IMPRESSION:  Pt arrived today with RT sided neck pain and numbness into RT UE still  and feels like she strained her UT today at work. Notable tenderness in RT levator today and unable to tolerate TPR to cerv. Paras or levator.  Pt did well with  [place] combo and light STW. Good release again in UT , but increased tenderness in cerv. Paras and levator scap. Decreased pain end of session.  IFC and HMP end of session. .   OBJECTIVE IMPAIRMENTS: decreased activity tolerance, decreased ROM, decreased strength, hypomobility, impaired sensation, impaired tone, impaired UE functional use, and pain.   ACTIVITY LIMITATIONS: carrying, lifting, reach over head, and caring for others  PARTICIPATION LIMITATIONS: meal prep, cleaning, laundry, shopping, community activity, and occupation  PERSONAL FACTORS: Profession and 3+ comorbidities: CAD, history of MI, fibromyalgia, current smoker, and depression  are also affecting patient's functional outcome.   REHAB POTENTIAL: Fair    CLINICAL DECISION MAKING: Evolving/moderate complexity  EVALUATION COMPLEXITY: Moderate   GOALS: Goals reviewed with patient?  Yes  LONG TERM GOALS: Target date: 05/20/23  Patient will be independent with her HEP.  Baseline:  Goal status: INITIAL  2.  Patient will be able to complete  her critical job demands without her familiar pain exceeding 5/10. Baseline:  Goal status: INITIAL  3.  Patient will be able to demonstrate at least 60 degrees of cervical rotation bilaterally for improved awareness of her surroundings.  Baseline:  Goal status: INITIAL  4.  Patient will improve her right hand grip strength to at least 35 pounds for improved function carrying groceries with he right upper extremity.  Baseline:  Goal status: INITIAL  5.  Patient will report being able to sleep at least 6 hours without being awakened by her familiar cervical symptoms.  Baseline:  Goal status: INITIAL  PLAN:  PT FREQUENCY: 2x/week  PT DURATION: 4 weeks  PLANNED INTERVENTIONS: Therapeutic exercises, Therapeutic activity, Neuromuscular re-education, Patient/Family education, Self Care, Joint mobilization, Dry Needling, Electrical stimulation, Spinal manipulation, Spinal mobilization, Cryotherapy, Moist heat, Traction, Ultrasound, Manual therapy, and Re-evaluation  PLAN FOR NEXT SESSION: scapular retraction, chin tucks, SNAG's, manual therapy/cervical traction, and modalities as needed   Nakeitha Milligan,CHRIS, PTA 05/07/2023, 5:25 PM

## 2023-05-09 ENCOUNTER — Ambulatory Visit: Payer: Managed Care, Other (non HMO) | Admitting: *Deleted

## 2023-05-09 DIAGNOSIS — M5412 Radiculopathy, cervical region: Secondary | ICD-10-CM

## 2023-05-09 DIAGNOSIS — R208 Other disturbances of skin sensation: Secondary | ICD-10-CM

## 2023-05-09 NOTE — Therapy (Signed)
OUTPATIENT PHYSICAL THERAPY CERVICAL TREATMENT   Patient Name: Margaret Dixon MRN: [id number] DOB:02-18-1961, 62 y.o., female Today's Date: 05/09/2023  END OF SESSION:  PT End of Session - 05/09/23 1610     Visit Number 5    Number of Visits 8    Date for PT Re-Evaluation 06/21/23    PT Start Time 1600    PT Stop Time 1646    PT Time Calculation (min) 46 min              Past Medical History:  Diagnosis Date   Constipation    Depression    due to Fibromyalgia   Diarrhea    Fibromyalgia    Hormone replacement therapy, postmenopausal    PONV (postoperative nausea and vomiting)    Past Surgical History:  Procedure Laterality Date   ABDOMINAL ADHESION SURGERY  07/28/2015   APPENDECTOMY     CHOLECYSTECTOMY     COLONOSCOPY     CORONARY STENT INTERVENTION N/A 12/04/2021   Procedure: CORONARY STENT INTERVENTION;  Surgeon: Swaziland, Peter M, MD;  Location: [facility];  Service: Cardiovascular;  Laterality: N/A;  circumflex   INCISION AND DRAINAGE ABSCESS N/A 11/03/2015   Procedure: EXCISION CHRONIC ABDOMINAL WALL STITCH ABSCESS;  Surgeon: Abigail Miyamoto, MD;  Location: [facility];  Service: General;  Laterality: N/A;   LAPAROTOMY N/A 07/28/2015   Procedure: EXPLORATORY LAPAROTOMY;  Surgeon: Abigail Miyamoto, MD;  Location: [facility] OR;  Service: General;  Laterality: N/A;   LEFT HEART CATH AND CORONARY ANGIOGRAPHY N/A 12/04/2021   Procedure: LEFT HEART CATH AND CORONARY ANGIOGRAPHY;  Surgeon: Swaziland, Peter M, MD;  Location: [facility] INVASIVE CV LAB;  Service: Cardiovascular;  Laterality: N/A;   LYSIS OF ADHESION N/A 07/28/2015   Procedure: LYSIS OF ADHESION;  Surgeon: Abigail Miyamoto, MD;  Location: [facility] OR;  Service: General;  Laterality: N/A;   TONSILLECTOMY     VAGINAL HYSTERECTOMY     Patient Active Problem List   Diagnosis Date Noted   Prediabetes 04/03/2023   Vitamin B12 deficiency 04/03/2023   Former tobacco use 04/03/2023   Eustachian tube dysfunction,  bilateral 04/03/2023   Menopause 12/04/2021   Thyroid dysfunction 12/04/2021   ACS (acute coronary syndrome) (HCC) 12/02/2021   NSTEMI (non-ST elevated myocardial infarction) (HCC) 12/02/2021   Vitamin D deficiency 09/26/2015   S/P exploratory laparotomy 07/28/2015   Dyslipidemia 05/23/2015   Anemia due to vitamin B12 deficiency 05/23/2015   Fibromyalgia 04/29/2014   CAD (coronary artery disease) 02/25/2012   Tobacco abuse 02/25/2012   Hyperlipidemia 02/25/2012   REFERRING PROVIDER: Mechele Claude, MD   REFERRING DIAG: Neck pain   THERAPY DIAG:  Other disturbances of skin sensation  Radiculopathy, cervical region  Rationale for Evaluation and Treatment: Rehabilitation  ONSET DATE: 2 weeks ago  SUBJECTIVE:  SUBJECTIVE STATEMENT: Pt reports doing better with some decreased pain, but continues to have flare-ups RT side. RT sided neck pain with numbness in RT hand 4-5/10.   Hand dominance: Right  PERTINENT HISTORY:  CAD, history of MI, fibromyalgia, current smoker, and depression  PAIN:  Are you having pain? Yes: NPRS scale: 5-6/10 Pain location: neck and right arm Pain description: intermittent throbbing and numbness Aggravating factors: pushing, pulling, lifting, turning her head Relieving factors: alternating heat and ice, and medication  PRECAUTIONS: None  RED FLAGS: None    WEIGHT BEARING RESTRICTIONS: No  FALLS:  Has patient fallen in last 6 months? No  LIVING ENVIRONMENT: Lives with: lives alone Lives in: House/apartment Has following equipment at home: None  OCCUPATION: CNA  PLOF: Independent  PATIENT GOALS: reduced pain and numbness, be able to do her critical job demands without pain, and be able to sleep better (3-4 hours at most)   NEXT MD VISIT:  07/12/23  OBJECTIVE:   DIAGNOSTIC FINDINGS: 04/03/23 cervical x-ray IMPRESSION: 1. No acute findings. 2. Degenerative changes.  COGNITION: Overall cognitive status: Within functional limits for tasks assessed  SENSATION: Light touch: Impaired with diminished sensation at C5, C8-T1 on the right upper extremity Patient reports numbness into her right hand currently.   PALPATION: TTP: right upper trapezius, cervical paraspinals, and supraspinatus   CERVICAL ROM:   Active ROM A/PROM (deg) eval  Flexion 40; neck pain   Extension 18  Right lateral flexion 18; painful   Left lateral flexion 22  Right rotation 44; right sided neck and shoulder pain   Left rotation 44; slight pain   (Blank rows = not tested)  UPPER EXTREMITY ROM: WFL for activities assessed  Active ROM Right eval Left eval  Shoulder flexion Painful    Shoulder extension    Shoulder abduction    Shoulder adduction    Shoulder extension    Shoulder internal rotation    Shoulder external rotation    Elbow flexion Right shoulder pain   Elbow extension    Wrist flexion    Wrist extension    Wrist ulnar deviation    Wrist radial deviation    Wrist pronation    Wrist supination     (Blank rows = not tested)  UPPER EXTREMITY MMT:  MMT Right eval Left eval  Shoulder flexion 3/5; limited by pain 3/5  Shoulder extension    Shoulder abduction Unable to assess; limited by pain    Shoulder adduction    Shoulder extension    Shoulder internal rotation    Shoulder external rotation    Middle trapezius    Lower trapezius    Elbow flexion    Elbow extension    Wrist flexion    Wrist extension    Wrist ulnar deviation    Wrist radial deviation    Wrist pronation    Wrist supination    Grip strength 15 40   (Blank rows = not tested)  CERVICAL SPECIAL TESTS:  Spurling's test: Positive, Distraction test: Positive, and Sharp pursor's test: Negative  TODAY'S TREATMENT:  DATE:                                       05-09-23  [place] estim combo x 12 mins 1.5 w/cm2 to RT side cerv paras and UT, Levator Manual: STW and TPR to RT UT and on cerv. Paras . RT levator light STW , very tender still IFC x15 mins 80-150hz   to RT side UT/cerv area                                    EXERCISE LOG  Exercise Repetitions and Resistance Comments  Chin tucks X 10  Given for HEP                   Blank cell = exercise not performed today   PATIENT EDUCATION:  Education details: POC, healing, prognosis, anatomy, x-ray results, and goals for therapy Person educated: Patient Education method: Explanation Education comprehension: verbalized understanding  HOME EXERCISE PROGRAM:   ASSESSMENT:  CLINICAL IMPRESSION:  Pt arrived today with RT sided neck pain and numbness into RT UE still. Notable tenderness in RT levator and unable to tolerate TPR again, but less pain in cerv. Paras.  Pt did well with  [place] combo and light STW. Good release again in UT and some in cervical paras.Decreased pain after treatment, but RT hand numbness remains the same  IFC and HMP end of session. .   OBJECTIVE IMPAIRMENTS: decreased activity tolerance, decreased ROM, decreased strength, hypomobility, impaired sensation, impaired tone, impaired UE functional use, and pain.   ACTIVITY LIMITATIONS: carrying, lifting, reach over head, and caring for others  PARTICIPATION LIMITATIONS: meal prep, cleaning, laundry, shopping, community activity, and occupation  PERSONAL FACTORS: Profession and 3+ comorbidities: CAD, history of MI, fibromyalgia, current smoker, and depression  are also affecting patient's functional outcome.   REHAB POTENTIAL: Fair    CLINICAL DECISION MAKING: Evolving/moderate complexity  EVALUATION COMPLEXITY: Moderate   GOALS: Goals reviewed with patient? Yes  LONG TERM GOALS: Target date:  05/20/23  Patient will be independent with her HEP.  Baseline:  Goal status: INITIAL  2.  Patient will be able to complete  her critical job demands without her familiar pain exceeding 5/10. Baseline:  Goal status: INITIAL  3.  Patient will be able to demonstrate at least 60 degrees of cervical rotation bilaterally for improved awareness of her surroundings.  Baseline:  Goal status: INITIAL  4.  Patient will improve her right hand grip strength to at least 35 pounds for improved function carrying groceries with he right upper extremity.  Baseline:  Goal status: INITIAL  5.  Patient will report being able to sleep at least 6 hours without being awakened by her familiar cervical symptoms.  Baseline:  Goal status: INITIAL  PLAN:  PT FREQUENCY: 2x/week  PT DURATION: 4 weeks  PLANNED INTERVENTIONS: Therapeutic exercises, Therapeutic activity, Neuromuscular re-education, Patient/Family education, Self Care, Joint mobilization, Dry Needling, Electrical stimulation, Spinal manipulation, Spinal mobilization, Cryotherapy, Moist heat, Traction, Ultrasound, Manual therapy, and Re-evaluation  PLAN FOR NEXT SESSION: scapular retraction, chin tucks, SNAG's, manual therapy/cervical traction, and modalities as needed   Walaa Carel,CHRIS, PTA 05/09/2023, 6:14 PM

## 2023-05-10 ENCOUNTER — Encounter: Payer: Self-pay | Admitting: Family Medicine

## 2023-05-10 ENCOUNTER — Ambulatory Visit (INDEPENDENT_AMBULATORY_CARE_PROVIDER_SITE_OTHER): Payer: Managed Care, Other (non HMO) | Admitting: Family Medicine

## 2023-05-10 ENCOUNTER — Ambulatory Visit (INDEPENDENT_AMBULATORY_CARE_PROVIDER_SITE_OTHER): Payer: Managed Care, Other (non HMO)

## 2023-05-10 VITALS — BP 124/79 | HR 87 | Temp 98.2°F | Ht 64.0 in | Wt 151.0 lb

## 2023-05-10 DIAGNOSIS — M542 Cervicalgia: Secondary | ICD-10-CM

## 2023-05-10 DIAGNOSIS — M25511 Pain in right shoulder: Secondary | ICD-10-CM

## 2023-05-10 DIAGNOSIS — R944 Abnormal results of kidney function studies: Secondary | ICD-10-CM | POA: Diagnosis not present

## 2023-05-10 MED ORDER — NAPROXEN 500 MG PO TABS: 500.0000 mg | ORAL_TABLET | Freq: Two times a day (BID) | ORAL | 0 refills | Status: DC

## 2023-05-10 NOTE — Progress Notes (Signed)
Subjective:  Patient ID: Margaret Dixon, female    DOB: Sep 21, 1960, 62 y.o.   MRN: [id number]  Patient Care Team: Arrie Senate, FNP as PCP - General (Family Medicine) Wendall Stade, MD as PCP - Cardiology (Cardiology)   Chief Complaint:  right shoulder pain/numbness (5 visits with PT. Pt advised patient get an MRI)  HPI: Margaret Dixon is a 62 y.o. female presenting on 05/10/2023 for right shoulder pain/numbness (5 visits with PT. Pt advised patient get an MRI) States that Margaret Dixon has completed 5 sessions of PT and Margaret Dixon is not any better. PT recommended further evaluation and imaging. States that Margaret Dixon has pain with pull, pushing, and lifting. Margaret Dixon states that Margaret Dixon cannot afford to be out of work, so Margaret Dixon is still working. 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 Margaret Dixon is taking tylenol OTC which helps very little. Takes zanaflex as needed. States that it helps some, but causes drowsiness.   Relevant past medical, surgical, family, and social history reviewed and updated as indicated.  Allergies and medications reviewed and updated. Data reviewed: Chart in Epic.   Past Medical History:  Diagnosis Date   Constipation    Depression    due to Fibromyalgia   Diarrhea    Fibromyalgia    Hormone replacement therapy, postmenopausal    PONV (postoperative nausea and vomiting)     Past Surgical History:  Procedure Laterality Date   ABDOMINAL ADHESION SURGERY  07/28/2015   APPENDECTOMY     CHOLECYSTECTOMY     COLONOSCOPY     CORONARY STENT INTERVENTION N/A 12/04/2021   Procedure: CORONARY STENT INTERVENTION;  Surgeon: Swaziland, Peter M, MD;  Location: [facility] INVASIVE CV LAB;  Service: Cardiovascular;  Laterality: N/A;  circumflex   INCISION AND DRAINAGE ABSCESS N/A 11/03/2015   Procedure: EXCISION CHRONIC ABDOMINAL WALL STITCH ABSCESS;  Surgeon: Abigail Miyamoto, MD;  Location: [facility];  Service: General;  Laterality: N/A;   LAPAROTOMY N/A 07/28/2015    Procedure: EXPLORATORY LAPAROTOMY;  Surgeon: Abigail Miyamoto, MD;  Location: [facility] OR;  Service: General;  Laterality: N/A;   LEFT HEART CATH AND CORONARY ANGIOGRAPHY N/A 12/04/2021   Procedure: LEFT HEART CATH AND CORONARY ANGIOGRAPHY;  Surgeon: Swaziland, Peter M, MD;  Location: [facility] INVASIVE CV LAB;  Service: Cardiovascular;  Laterality: N/A;   LYSIS OF ADHESION N/A 07/28/2015   Procedure: LYSIS OF ADHESION;  Surgeon: Abigail Miyamoto, MD;  Location: [facility] OR;  Service: General;  Laterality: N/A;   TONSILLECTOMY     VAGINAL HYSTERECTOMY      Social History   Socioeconomic History   Marital status: Divorced    Spouse name: Not on file   Number of children: 2   Years of education: Not on file   Highest education level: GED or equivalent  Occupational History   Not on file  Tobacco Use   Smoking status: Every Day    Current packs/day: 0.00    Average packs/day: 0.3 packs/day for 8.0 years (2.4 ttl pk-yrs)    Types: Cigarettes    Start date: 07/26/2006    Last attempt to quit: 07/26/2014    Years since quitting: 8.7   Smokeless tobacco: Never  Substance and Sexual Activity   Alcohol use: Yes    Comment: socially   Drug use: No   Sexual activity: Not on file  Other Topics Concern   Not on file  Social History Narrative   Lives alone.  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     Social Determinants of Health   Financial Resource Strain: Low Risk  (04/10/2023)   Overall Financial Resource Strain (CARDIA)    Difficulty of Paying Living Expenses: Not hard at all  Food Insecurity: No Food Insecurity (04/10/2023)   Hunger Vital Sign    Worried About Running Out of Food in the Last Year: Never true    Ran Out of Food in the Last Year: Never true  Transportation Needs: No Transportation Needs (04/10/2023)   PRAPARE - Administrator, Civil Service (Medical): No    Lack of Transportation (Non-Medical): No  Physical Activity: Insufficiently Active (04/10/2023)   Exercise Vital Sign     Days of Exercise per Week: 2 days    Minutes of Exercise per Session: 20 min  Stress: No Stress Concern Present (04/10/2023)   [employer] of Occupational Health - Occupational Stress Questionnaire    Feeling of Stress : Only a little  Social Connections: Moderately Isolated (04/10/2023)   Social Connection and Isolation Panel [NHANES]    Frequency of Communication with Friends and Family: Never    Frequency of Social Gatherings with Friends and Family: Never    Attends Religious Services: More than 4 times per year    Active Member of [employer] or Organizations: Yes    Attends Banker Meetings: More than 4 times per year    Marital Status: Divorced  Intimate Partner Violence: Unknown (11/28/2021)   Received from [employer], [facility]   HITS    Physically Hurt: Not on file    Insult or Talk Down To: Not on file    Threaten Physical Harm: Not on file    Scream or Curse: Not on file    Outpatient Encounter Medications as of 05/10/2023  Medication Sig   Ascorbic Acid (VITAMIN C GUMMIES PO) Take by mouth.   aspirin (ASPIRIN LOW DOSE) 81 MG chewable tablet CHEW 1 TABLET BY MOUTH DAILY.   atorvastatin (LIPITOR) 10 MG tablet Take 1 tablet (10 mg total) by mouth daily.   cyanocobalamin (,VITAMIN B-12,) 1000 MCG/ML injection Inject 1,000 mcg into the muscle every 14 (fourteen) days.   estradiol (ESTRACE) 1 MG tablet Take 1 mg by mouth daily.   Multiple Vitamins-Minerals (MULTIVITAMIN GUMMIES ADULT) CHEW Chew by mouth.   nitroGLYCERIN (NITROSTAT) 0.4 MG SL tablet Place 1 tablet (0.4 mg total) under the tongue every 5 (five) minutes as needed for chest pain. PLACE 1 TABLET UNDER THE TONGUE EVERY 5 MIN X 3 DOSES AS NEEDED FOR CHEST PAIN.   tiZANidine (ZANAFLEX) 4 MG tablet Take 1 tablet (4 mg total) by mouth every 6 (six) hours as needed for muscle spasms.   Vitamin D, Ergocalciferol, (DRISDOL) 50000 units CAPS capsule Take 50,000 Units by mouth every 7 (seven) days.    Facility-Administered Encounter Medications as of 05/10/2023  Medication   clindamycin (CLEOCIN) 900 mg in dextrose 5 % 50 mL IVPB   And   gentamicin (GARAMYCIN) 5 mg/kg in dextrose 5 % 50 mL IVPB   cyanocobalamin (VITAMIN B12) injection 1,000 mcg    Allergies  Allergen Reactions   Penicillins Hives and Rash    Has patient had a PCN reaction causing immediate rash, facial/tongue/throat swelling, SOB or lightheadedness with hypotension: Yes Has patient had a PCN reaction causing severe rash involving mucus membranes or skin necrosis: No Has patient had a PCN reaction that required hospitalization No Has patient had a PCN reaction occurring within  the last 10 years: No If all of the above answers are "NO", then may proceed with Cephalosporin use.    Repatha [Evolocumab] Shortness Of Breath    Hives, ? angioedema   Shellfish Allergy Hives and Swelling   Ticagrelor Shortness Of Breath   Cephalexin    Clarithromycin     Review of Systems As per HPI  Objective:  BP 124/79   Pulse 87   Temp 98.2 F (36.8 C)   Ht 5\' 4"  (1.626 m)   Wt 151 lb (68.5 kg)   SpO2 93%   BMI 25.92 kg/m    Wt Readings from Last 3 Encounters:  05/10/23 151 lb (68.5 kg)  04/18/23 151 lb (68.5 kg)  04/10/23 152 lb (68.9 kg)   Physical Exam Constitutional:      General: Margaret Dixon is awake. Margaret Dixon is not in acute distress.    Appearance: Normal appearance. Margaret Dixon is well-developed and well-groomed. Margaret Dixon is not ill-appearing, toxic-appearing or diaphoretic.  Cardiovascular:     Rate and Rhythm: Normal rate.     Pulses: Normal pulses.          Radial pulses are 2+ on the right side and 2+ on the left side.       Posterior tibial pulses are 2+ on the right side and 2+ on the left side.     Heart sounds: Normal heart sounds. No murmur heard.    No gallop.  Pulmonary:     Effort: Pulmonary effort is normal. No respiratory distress.     Breath sounds: Normal breath sounds. No stridor. No wheezing, rhonchi or rales.   Musculoskeletal:     Right shoulder: Tenderness and bony tenderness present. No swelling, deformity, effusion, laceration or crepitus. Decreased range of motion. Decreased strength. Normal pulse.     Cervical back: Full passive range of motion without pain and neck supple.     Right lower leg: No edema.     Left lower leg: No edema.     Comments: Decreased ROM due to pain.  Positive Empty can test  Positive internal rotation test.  Pain with forward flexion   Skin:    General: Skin is warm.     Capillary Refill: Capillary refill takes less than 2 seconds.  Neurological:     General: No focal deficit present.     Mental Status: Margaret Dixon is alert, oriented to person, place, and time and easily aroused. Mental status is at baseline.     GCS: GCS eye subscore is 4. GCS verbal subscore is 5. GCS motor subscore is 6.     Motor: No weakness.  Psychiatric:        Attention and Perception: Attention and perception normal.        Mood and Affect: Mood and affect normal.        Speech: Speech normal.        Behavior: Behavior normal. Behavior is cooperative.        Thought Content: Thought content normal. Thought content does not include homicidal or suicidal ideation. Thought content does not include homicidal or suicidal plan.        Cognition and Memory: Cognition and memory normal.        Judgment: Judgment normal.     Results for orders placed or performed in visit on 04/03/23  Anemia Profile B  Result Value Ref Range   Total Iron Binding Capacity 402 250 - 450 ug/dL   UIBC 962 952 - 841 ug/dL   Iron  182 (H) 27 - 139 ug/dL   Iron Saturation 45 15 - 55 %   Ferritin 42 15 - 150 ng/mL   Vitamin B-12 815 232 - 1,245 pg/mL   Folate >20.0 >3.0 ng/mL   WBC 7.7 3.4 - 10.8 x10E3/uL   RBC 4.76 3.77 - 5.28 x10E6/uL   Hemoglobin 14.9 11.1 - 15.9 g/dL   Hematocrit 25.9 56.3 - 46.6 %   MCV 94 79 - 97 fL   MCH 31.3 26.6 - 33.0 pg   MCHC 33.3 31.5 - 35.7 g/dL   RDW 87.5 64.3 - 32.9 %   Platelets  290 150 - 450 x10E3/uL   Neutrophils 48 Not Estab. %   Lymphs 37 Not Estab. %   Monocytes 11 Not Estab. %   Eos 3 Not Estab. %   Basos 1 Not Estab. %   Neutrophils Absolute 3.8 1.4 - 7.0 x10E3/uL   Lymphocytes Absolute 2.8 0.7 - 3.1 x10E3/uL   Monocytes Absolute 0.8 0.1 - 0.9 x10E3/uL   EOS (ABSOLUTE) 0.2 0.0 - 0.4 x10E3/uL   Basophils Absolute 0.1 0.0 - 0.2 x10E3/uL   Immature Granulocytes 0 Not Estab. %   Immature Grans (Abs) 0.0 0.0 - 0.1 x10E3/uL   Retic Ct Pct 1.6 0.6 - 2.6 %  VITAMIN D 25 Hydroxy (Vit-D Deficiency, Fractures)  Result Value Ref Range   Vit D, 25-Hydroxy 52.4 30.0 - 100.0 ng/mL  Thyroid Panel With TSH  Result Value Ref Range   TSH 2.340 0.450 - 4.500 uIU/mL   T4, Total 8.1 4.5 - 12.0 ug/dL   T3 Uptake Ratio 23 (L) 24 - 39 %   Free Thyroxine Index 1.9 1.2 - 4.9  Lipid panel  Result Value Ref Range   Cholesterol, Total 194 100 - 199 mg/dL   Triglycerides 518 (H) 0 - 149 mg/dL   HDL 50 >84 mg/dL   VLDL Cholesterol Cal 55 (H) 5 - 40 mg/dL   LDL Chol Calc (NIH) 89 0 - 99 mg/dL   Chol/HDL Ratio 3.9 0.0 - 4.4 ratio  Bayer DCA Hb A1c Waived  Result Value Ref Range   HB A1C (BAYER DCA - WAIVED) 5.6 4.8 - 5.6 %       05/10/2023    3:48 PM 04/18/2023    2:30 PM 04/10/2023    9:53 AM 04/03/2023    1:13 PM  Depression screen PHQ 2/9  Decreased Interest 0 0 0 0  Down, Depressed, Hopeless 0 0 0 0  PHQ - 2 Score 0 0 0 0  Altered sleeping 0 0  0  Tired, decreased energy 0 0  0  Change in appetite 0 0  0  Feeling bad or failure about yourself  0 0  0  Trouble concentrating 0 0  0  Moving slowly or fidgety/restless 0 0  0  Suicidal thoughts 0 0  0  PHQ-9 Score 0 0  0  Difficult doing work/chores  Not difficult at all  Not difficult at all       05/10/2023    3:49 PM 04/18/2023    2:31 PM 04/03/2023    1:13 PM  GAD 7 : Generalized Anxiety Score  Nervous, Anxious, on Edge 0 0 0  Control/stop worrying 0 0 0  Worry too much - different things 0 0 0  Trouble  relaxing 0 0 0  Restless 0 0 0  Easily annoyed or irritable 0 0 0  Afraid - awful might happen 0 0 0  Total  GAD 7 Score 0 0 0  Anxiety Difficulty Not difficult at all Not difficult at all Not difficult at all     Pertinent labs & imaging results that were available during my care of the patient were reviewed by me and considered in my medical decision making.  Assessment & Plan:  Kashara was seen today for right shoulder pain/numbness.  Diagnoses and all orders for this visit:  Neck pain Patient failed conservative treatment. Referral as below. Will provide NSAIDs for pain relief. Will repeat CMP for renal function evaluation.  -     Ambulatory referral to Orthopedic Surgery -     naproxen (NAPROSYN) 500 MG tablet; Take 1 tablet (500 mg total) by mouth 2 (two) times daily with a meal.  Acute pain of right shoulder Patient failed conservative treatment. Referral as above. Will provide NSAIDs for pain relief. Will repeat CMP for renal function evaluation.  Imaging as below. Will communicate results to patient once available. Will await results to determine next steps.  -     DG Shoulder Right -     naproxen (NAPROSYN) 500 MG tablet; Take 1 tablet (500 mg total) by mouth 2 (two) times daily with a meal. -     CMP14+EGFR   Continue all other maintenance medications.  Follow up plan: Return in about 3 months (around 08/09/2023) for Chronic Condition Follow up.   Continue healthy lifestyle choices, including diet (rich in fruits, vegetables, and lean proteins, and low in salt and simple carbohydrates) and exercise (at least 30 minutes of moderate physical activity daily).  Written and verbal instructions provided   The above assessment and management plan was discussed with the patient. The patient verbalized understanding of and has agreed to the management plan. Patient is aware to call the clinic if they develop any new symptoms or if symptoms persist or worsen. Patient is aware  when to return to the clinic for a follow-up visit. Patient educated on when it is appropriate to go to the emergency department.   Neale Burly, DNP-FNP [facility] [address] [place], [place] [address] [phone]

## 2023-05-11 LAB — CMP14+EGFR
ALT: 19 IU/L (ref 0–32)
AST: 23 IU/L (ref 0–40)
Albumin: 4.7 g/dL (ref 3.9–4.9)
Alkaline Phosphatase: 63 IU/L (ref 44–121)
BUN/Creatinine Ratio: 15 (ref 12–28)
BUN: 17 mg/dL (ref 8–27)
Bilirubin Total: 0.3 mg/dL (ref 0.0–1.2)
CO2: 23 mmol/L (ref 20–29)
Calcium: 9.6 mg/dL (ref 8.7–10.3)
Chloride: 103 mmol/L (ref 96–106)
Creatinine, Ser: 1.14 mg/dL — ABNORMAL HIGH (ref 0.57–1.00)
Globulin, Total: 2.4 g/dL (ref 1.5–4.5)
Glucose: 80 mg/dL (ref 70–99)
Potassium: 4 mmol/L (ref 3.5–5.2)
Sodium: 141 mmol/L (ref 134–144)
Total Protein: 7.1 g/dL (ref 6.0–8.5)
eGFR: 54 mL/min/{1.73_m2} — ABNORMAL LOW (ref >59)

## 2023-05-13 ENCOUNTER — Other Ambulatory Visit: Payer: Self-pay | Admitting: *Deleted

## 2023-05-13 NOTE — Addendum Note (Signed)
Addended by: Neale Burly on: 05/13/2023 01:50 PM   Modules accepted: Orders

## 2023-05-13 NOTE — Progress Notes (Signed)
Decreased GFR. Would like for patient to avoid NSAIDs (ibuprofen, naproxen, celebrex). Increase hydration. Recommend 80-100 oz of water daily. Please repeat in 2 weeks,

## 2023-05-14 ENCOUNTER — Ambulatory Visit: Payer: Managed Care, Other (non HMO) | Admitting: *Deleted

## 2023-05-14 ENCOUNTER — Telehealth: Payer: Self-pay | Admitting: Family Medicine

## 2023-05-16 ENCOUNTER — Encounter: Payer: Managed Care, Other (non HMO) | Admitting: *Deleted

## 2023-05-17 ENCOUNTER — Ambulatory Visit (INDEPENDENT_AMBULATORY_CARE_PROVIDER_SITE_OTHER): Payer: Managed Care, Other (non HMO) | Admitting: *Deleted

## 2023-05-17 DIAGNOSIS — E538 Deficiency of other specified B group vitamins: Secondary | ICD-10-CM | POA: Diagnosis not present

## 2023-05-17 NOTE — Progress Notes (Signed)
Pt given B12 injection IM left deltoid and tolerated well. °

## 2023-05-27 ENCOUNTER — Ambulatory Visit: Payer: Managed Care, Other (non HMO) | Admitting: Orthopedic Surgery

## 2023-05-28 ENCOUNTER — Other Ambulatory Visit: Payer: Managed Care, Other (non HMO)

## 2023-05-28 DIAGNOSIS — R944 Abnormal results of kidney function studies: Secondary | ICD-10-CM

## 2023-05-28 NOTE — Progress Notes (Signed)
Mild degenerative disease. Referral placed at visit to ortho. Patient to follow up with ortho.

## 2023-05-29 LAB — CMP14+EGFR
ALT: 23 [IU]/L (ref 0–32)
AST: 25 [IU]/L (ref 0–40)
Albumin: 4.5 g/dL (ref 3.9–4.9)
Alkaline Phosphatase: 70 [IU]/L (ref 44–121)
BUN/Creatinine Ratio: 17 (ref 12–28)
BUN: 17 mg/dL (ref 8–27)
Bilirubin Total: 0.2 mg/dL (ref 0.0–1.2)
CO2: 23 mmol/L (ref 20–29)
Calcium: 9.5 mg/dL (ref 8.7–10.3)
Chloride: 101 mmol/L (ref 96–106)
Creatinine, Ser: 1 mg/dL (ref 0.57–1.00)
Globulin, Total: 2.6 g/dL (ref 1.5–4.5)
Glucose: 93 mg/dL (ref 70–99)
Potassium: 4.5 mmol/L (ref 3.5–5.2)
Sodium: 140 mmol/L (ref 134–144)
Total Protein: 7.1 g/dL (ref 6.0–8.5)
eGFR: 63 mL/min/{1.73_m2} (ref >59)

## 2023-05-29 NOTE — Progress Notes (Signed)
GFR improved. Continue to avoid NSAIDs, continue hydration 80-100 oz of water daily.

## 2023-05-31 ENCOUNTER — Ambulatory Visit (INDEPENDENT_AMBULATORY_CARE_PROVIDER_SITE_OTHER): Payer: Managed Care, Other (non HMO)

## 2023-05-31 DIAGNOSIS — E538 Deficiency of other specified B group vitamins: Secondary | ICD-10-CM | POA: Diagnosis not present

## 2023-05-31 NOTE — Progress Notes (Signed)
Cyanocobalamin injection given to right deltoid.  Patient tolerated well. 

## 2023-06-14 ENCOUNTER — Encounter: Payer: Self-pay | Admitting: Family Medicine

## 2023-06-14 ENCOUNTER — Ambulatory Visit (INDEPENDENT_AMBULATORY_CARE_PROVIDER_SITE_OTHER): Payer: Managed Care, Other (non HMO)

## 2023-06-14 DIAGNOSIS — E538 Deficiency of other specified B group vitamins: Secondary | ICD-10-CM

## 2023-06-14 DIAGNOSIS — Z1231 Encounter for screening mammogram for malignant neoplasm of breast: Secondary | ICD-10-CM

## 2023-06-14 NOTE — Progress Notes (Signed)
Cyanocobalamin injection given to left deltoid.  Patient tolerated well. 

## 2023-06-19 ENCOUNTER — Telehealth: Payer: Self-pay | Admitting: Family Medicine

## 2023-06-19 NOTE — Telephone Encounter (Signed)
Vitamin D level normal on last check. Recommend patient start daily OTC supplementation [phone] international units daily.

## 2023-06-19 NOTE — Telephone Encounter (Signed)
  Prescription Request  06/19/2023  Is this a "Controlled Substance" medicine? NO  Have you seen your PCP in the last 2 weeks? No PT has appt on 07/12/23 with GM  If YES, route message to pool  -  If NO, patient needs to be scheduled for appointment.  What is the name of the medication or equipment? Vitamin D, Ergocalciferol, (DRISDOL) 50000 units CAPS capsule   Have you contacted your pharmacy to request a refill? yes   Which pharmacy would you like this sent to? [facility]   Patient notified that their request is being sent to the clinical staff for review and that they should receive a response within 2 business days.

## 2023-06-19 NOTE — Telephone Encounter (Signed)
Contacted patient. Notified patient. Patient verbalized understanding 

## 2023-06-28 ENCOUNTER — Ambulatory Visit (INDEPENDENT_AMBULATORY_CARE_PROVIDER_SITE_OTHER): Payer: Managed Care, Other (non HMO)

## 2023-06-28 DIAGNOSIS — E538 Deficiency of other specified B group vitamins: Secondary | ICD-10-CM

## 2023-06-28 NOTE — Progress Notes (Signed)
Cyanocobalamin injection given to right deltoid.  Patient tolerated well. 

## 2023-07-01 ENCOUNTER — Ambulatory Visit
Admission: RE | Admit: 2023-07-01 | Discharge: 2023-07-01 | Disposition: A | Discharge status: Home / Self Care | Payer: Managed Care, Other (non HMO) | Source: Ambulatory Visit

## 2023-07-01 DIAGNOSIS — Z1231 Encounter for screening mammogram for malignant neoplasm of breast: Secondary | ICD-10-CM

## 2023-07-05 ENCOUNTER — Other Ambulatory Visit: Payer: Self-pay | Admitting: Family Medicine

## 2023-07-05 DIAGNOSIS — R928 Other abnormal and inconclusive findings on diagnostic imaging of breast: Secondary | ICD-10-CM

## 2023-07-12 ENCOUNTER — Ambulatory Visit: Payer: Managed Care, Other (non HMO)

## 2023-07-12 ENCOUNTER — Ambulatory Visit: Payer: Managed Care, Other (non HMO) | Admitting: Family Medicine

## 2023-07-12 ENCOUNTER — Encounter: Payer: Self-pay | Admitting: Family Medicine

## 2023-07-12 VITALS — BP 142/70 | HR 65 | Temp 98.3°F | Ht 64.0 in | Wt 158.0 lb

## 2023-07-12 DIAGNOSIS — R030 Elevated blood-pressure reading, without diagnosis of hypertension: Secondary | ICD-10-CM

## 2023-07-12 DIAGNOSIS — Z87891 Personal history of nicotine dependence: Secondary | ICD-10-CM

## 2023-07-12 DIAGNOSIS — D519 Vitamin B12 deficiency anemia, unspecified: Secondary | ICD-10-CM | POA: Diagnosis not present

## 2023-07-12 DIAGNOSIS — E559 Vitamin D deficiency, unspecified: Secondary | ICD-10-CM

## 2023-07-12 DIAGNOSIS — R7303 Prediabetes: Secondary | ICD-10-CM | POA: Diagnosis not present

## 2023-07-12 DIAGNOSIS — E781 Pure hyperglyceridemia: Secondary | ICD-10-CM | POA: Diagnosis not present

## 2023-07-12 DIAGNOSIS — E538 Deficiency of other specified B group vitamins: Secondary | ICD-10-CM

## 2023-07-12 NOTE — Progress Notes (Signed)
Subjective:  Patient ID: Margaret Dixon, female    DOB: 1960-11-20, 62 y.o.   MRN: [id number]  Patient Care Team: Arrie Senate, FNP as PCP - General (Family Medicine) Wendall Stade, MD as PCP - Cardiology (Cardiology)   Chief Complaint:  Medical Management of Chronic Issues (3 month)   HPI: Margaret Dixon is a 62 y.o. female presenting on 07/12/2023 for Medical Management of Chronic Issues (3 month)  1. Prediabetes Increased water and walking.   Last eye exam: one week ago.  Last foot exam: not indicated  Last A1c:  Lab Results  Component Value Date   HGBA1C 5.6 04/03/2023   Nephropathy screen indicated?: no  Last flu, zoster and/or pneumovax:  Immunization History  Administered Date(s) Administered   Influenza,inj,Quad PF,6+ Mos 06/14/2016, 06/13/2017   Influenza-Unspecified 06/11/2002, 07/28/2003, 05/28/2015, 06/14/2015, 06/14/2016, 06/13/2017   Pneumococcal Polysaccharide-23 07/29/2015   Tdap 10/25/2014   ROS: Denies dizziness, LOC, unintended weight loss, foot ulcerations, numbness or tingling in extremities, or chest pain. Endorses weight gain, more thirsty at night and getting up to go to the bathroom more frequently. Endorses slight shortness of breath.     2. Pure hypertriglyceridemia Lipid/Cholesterol, Follow-up  Last lipid panel Other pertinent labs  Lab Results  Component Value Date   CHOL 194 04/03/2023   HDL 50 04/03/2023   LDLCALC 89 04/03/2023   TRIG 334 (H) 04/03/2023   CHOLHDL 3.9 04/03/2023   Lab Results  Component Value Date   ALT 23 05/28/2023   AST 25 05/28/2023   PLT 290 04/03/2023   TSH 2.340 04/03/2023     She was last seen for this 6 months ago.  Management since that visit includes Lipitor.  She reports excellent compliance with treatment. She is not having side effects.   Symptoms: No chest pain No chest pressure/discomfort  Yes dyspnea No lower extremity edema  No numbness or tingling of extremity No  orthopnea  No palpitations No paroxysmal nocturnal dyspnea  No speech difficulty No syncope   Current diet: in general, an "unhealthy" diet Current exercise: walking  The ASCVD Risk score (Arnett DK, et al., 2019) failed to calculate for the following reasons:   The patient has a prior MI or stroke diagnosis  ---------------------------------------------------------------------------------------------------   3.  Former Tobacco abuse Quit 1 year ago. June 09, 2022.  States that she feels sluggish and tired all the time now and reports weight gain. States that she is trying to walk at work and drink more water. Has not made other changes to diet.   4. Anemia due to vitamin B12 deficiency, unspecified B12 deficiency type Anemia: Patient presents for presents evaluation of anemia. Anemia was found by routine CBC.  Associated signs & symptoms: dyspnea. States that she gets short of breath with walking long distances. She is able to complete all ADLs without getting short of breath. She believes it is due to weight gain from stopping smoking.   5. Vitamin B12 deficiency Continues to receive injection every 2 weeks. Reports improved symptoms of fibromyalgia with injections. Denies any side effects.   6. Vitamin D deficiency States that she started OTC daily supplement. Denies side effects or symptoms.   7. Elevated blood pressure reading without diagnosis of hypertension States that she has a slight headache and had a nosebleed this morning. She does not have a monitor at home. She stopped lisinopril this past year. She has not scheduled a follow up with cardiology, but she knows  that she is due for appt next month.   Relevant past medical, surgical, family, and social history reviewed and updated as indicated.  Allergies and medications reviewed and updated. Data reviewed: Chart in Epic.   Past Medical History:  Diagnosis Date   Constipation    Depression    due to Fibromyalgia    Diarrhea    Fibromyalgia    Hormone replacement therapy, postmenopausal    PONV (postoperative nausea and vomiting)     Past Surgical History:  Procedure Laterality Date   ABDOMINAL ADHESION SURGERY  07/28/2015   APPENDECTOMY     CHOLECYSTECTOMY     COLONOSCOPY     CORONARY STENT INTERVENTION N/A 12/04/2021   Procedure: CORONARY STENT INTERVENTION;  Surgeon: Swaziland, Peter M, MD;  Location: [facility] INVASIVE CV LAB;  Service: Cardiovascular;  Laterality: N/A;  circumflex   INCISION AND DRAINAGE ABSCESS N/A 11/03/2015   Procedure: EXCISION CHRONIC ABDOMINAL WALL STITCH ABSCESS;  Surgeon: Abigail Miyamoto, MD;  Location: [facility];  Service: General;  Laterality: N/A;   LAPAROTOMY N/A 07/28/2015   Procedure: EXPLORATORY LAPAROTOMY;  Surgeon: Abigail Miyamoto, MD;  Location: [facility] OR;  Service: General;  Laterality: N/A;   LEFT HEART CATH AND CORONARY ANGIOGRAPHY N/A 12/04/2021   Procedure: LEFT HEART CATH AND CORONARY ANGIOGRAPHY;  Surgeon: Swaziland, Peter M, MD;  Location: [facility] INVASIVE CV LAB;  Service: Cardiovascular;  Laterality: N/A;   LYSIS OF ADHESION N/A 07/28/2015   Procedure: LYSIS OF ADHESION;  Surgeon: Abigail Miyamoto, MD;  Location: [facility] OR;  Service: General;  Laterality: N/A;   TONSILLECTOMY     VAGINAL HYSTERECTOMY      Social History   Socioeconomic History   Marital status: Divorced    Spouse name: Not on file   Number of children: 2   Years of education: Not on file   Highest education level: GED or equivalent  Occupational History   Not on file  Tobacco Use   Smoking status: Every Day    Current packs/day: 0.00    Average packs/day: 0.3 packs/day for 8.0 years (2.4 ttl pk-yrs)    Types: Cigarettes    Start date: 07/26/2006    Last attempt to quit: 07/26/2014    Years since quitting: 8.9   Smokeless tobacco: Never  Substance and Sexual Activity   Alcohol use: Yes    Comment: socially   Drug use: No   Sexual activity: Not on file  Other Topics Concern    Not on file  Social History Narrative   Lives alone.  Works at [employer], sport and exercise.     Social Determinants of Health   Financial Resource Strain: Medium Risk (07/11/2023)   Overall Financial Resource Strain (CARDIA)    Difficulty of Paying Living Expenses: Somewhat hard  Food Insecurity: No Food Insecurity (07/11/2023)   Hunger Vital Sign    Worried About Running Out of Food in the Last Year: Never true    Ran Out of Food in the Last Year: Never true  Transportation Needs: No Transportation Needs (07/11/2023)   PRAPARE - Administrator, Civil Service (Medical): No    Lack of Transportation (Non-Medical): No  Physical Activity: Insufficiently Active (07/11/2023)   Exercise Vital Sign    Days of Exercise per Week: 3 days    Minutes of Exercise per Session: 30 min  Stress: No Stress Concern Present (07/11/2023)   [employer] of Occupational Health - Occupational Stress Questionnaire    Feeling of Stress :  Not at all  Social Connections: Moderately Integrated (07/11/2023)   Social Connection and Isolation Panel [NHANES]    Frequency of Communication with Friends and Family: More than three times a week    Frequency of Social Gatherings with Friends and Family: Patient declined    Attends Religious Services: More than 4 times per year    Active Member of [employer] or Organizations: Yes    Attends Banker Meetings: More than 4 times per year    Marital Status: Divorced  Intimate Partner Violence: Unknown (11/28/2021)   Received from [employer], [facility]   HITS    Physically Hurt: Not on file    Insult or Talk Down To: Not on file    Threaten Physical Harm: Not on file    Scream or Curse: Not on file    Outpatient Encounter Medications as of 07/12/2023  Medication Sig   Ascorbic Acid (VITAMIN C GUMMIES PO) Take by mouth.   aspirin (ASPIRIN LOW DOSE) 81 MG chewable tablet CHEW 1 TABLET BY MOUTH DAILY.   atorvastatin (LIPITOR) 10 MG tablet Take  1 tablet (10 mg total) by mouth daily.   cyanocobalamin (,VITAMIN B-12,) 1000 MCG/ML injection Inject 1,000 mcg into the muscle every 14 (fourteen) days.   estradiol (ESTRACE) 1 MG tablet Take 1 mg by mouth daily.   Multiple Vitamins-Minerals (MULTIVITAMIN GUMMIES ADULT) CHEW Chew by mouth.   nitroGLYCERIN (NITROSTAT) 0.4 MG SL tablet Place 1 tablet (0.4 mg total) under the tongue every 5 (five) minutes as needed for chest pain. PLACE 1 TABLET UNDER THE TONGUE EVERY 5 MIN X 3 DOSES AS NEEDED FOR CHEST PAIN.   tiZANidine (ZANAFLEX) 4 MG tablet Take 1 tablet (4 mg total) by mouth every 6 (six) hours as needed for muscle spasms.   Vitamin D, Ergocalciferol, (DRISDOL) 50000 units CAPS capsule Take 50,000 Units by mouth every 7 (seven) days.   [EXPIRED] predniSONE (DELTASONE) 20 MG tablet Take 2 tablets (40 mg total) by mouth daily with breakfast for 5 days.   [DISCONTINUED] clopidogrel (PLAVIX) 75 MG tablet FIRST DOSE: TAKE 4 TABLETS (300 MG) ON DAY ONE AND THEN DECREASE TO 1 TABLET (75 MG) DAILY THEREAFTER. PLEASE TAKE WITH YOUR LARGEST MEAL OF THE DAY AT AROUND THE SAME TIME EACH DAY.   [DISCONTINUED] naproxen (NAPROSYN) 500 MG tablet Take 1 tablet (500 mg total) by mouth 2 (two) times daily with a meal.   Facility-Administered Encounter Medications as of 07/12/2023  Medication   clindamycin (CLEOCIN) 900 mg in dextrose 5 % 50 mL IVPB   And   gentamicin (GARAMYCIN) 5 mg/kg in dextrose 5 % 50 mL IVPB   cyanocobalamin (VITAMIN B12) injection 1,000 mcg    Allergies  Allergen Reactions   Penicillins Hives and Rash    Has patient had a PCN reaction causing immediate rash, facial/tongue/throat swelling, SOB or lightheadedness with hypotension: Yes Has patient had a PCN reaction causing severe rash involving mucus membranes or skin necrosis: No Has patient had a PCN reaction that required hospitalization No Has patient had a PCN reaction occurring within the last 10 years: No If all of the above  answers are "NO", then may proceed with Cephalosporin use.    Repatha [Evolocumab] Shortness Of Breath    Hives, ? angioedema   Shellfish Allergy Hives and Swelling   Ticagrelor Shortness Of Breath   Cephalexin    Clarithromycin     Review of Systems As per HPI  Objective:  BP (!) 142/70  Pulse 65   Temp 98.3 F (36.8 C)   Ht 5\' 4"  (1.626 m)   Wt 158 lb (71.7 kg)   SpO2 96%   BMI 27.12 kg/m    Wt Readings from Last 3 Encounters:  07/12/23 158 lb (71.7 kg)  05/10/23 151 lb (68.5 kg)  04/18/23 151 lb (68.5 kg)    Physical Exam Constitutional:      General: She is awake. She is not in acute distress.    Appearance: Normal appearance. She is well-developed and well-groomed. She is not ill-appearing, toxic-appearing or diaphoretic.  Cardiovascular:     Rate and Rhythm: Normal rate and regular rhythm.     Pulses: Normal pulses.          Radial pulses are 2+ on the right side and 2+ on the left side.       Posterior tibial pulses are 2+ on the right side and 2+ on the left side.     Heart sounds: Normal heart sounds. No murmur heard.    No gallop.  Pulmonary:     Effort: Pulmonary effort is normal. No respiratory distress.     Breath sounds: Normal breath sounds. No stridor. No wheezing, rhonchi or rales.  Musculoskeletal:     Cervical back: Full passive range of motion without pain and neck supple.     Right lower leg: No edema.     Left lower leg: No edema.  Skin:    General: Skin is warm.     Capillary Refill: Capillary refill takes less than 2 seconds.  Neurological:     General: No focal deficit present.     Mental Status: She is alert, oriented to person, place, and time and easily aroused. Mental status is at baseline.     GCS: GCS eye subscore is 4. GCS verbal subscore is 5. GCS motor subscore is 6.     Motor: No weakness.  Psychiatric:        Attention and Perception: Attention and perception normal.        Mood and Affect: Mood and affect normal.         Speech: Speech normal.        Behavior: Behavior normal. Behavior is cooperative.        Thought Content: Thought content normal. Thought content does not include homicidal or suicidal ideation. Thought content does not include homicidal or suicidal plan.        Cognition and Memory: Cognition and memory normal.        Judgment: Judgment normal.     Results for orders placed or performed in visit on 05/28/23  CMP14+EGFR  Result Value Ref Range   Glucose 93 70 - 99 mg/dL   BUN 17 8 - 27 mg/dL   Creatinine, Ser 1.61 0.57 - 1.00 mg/dL   eGFR 63 >09 UE/AVW/0.98   BUN/Creatinine Ratio 17 12 - 28   Sodium 140 134 - 144 mmol/L   Potassium 4.5 3.5 - 5.2 mmol/L   Chloride 101 96 - 106 mmol/L   CO2 23 20 - 29 mmol/L   Calcium 9.5 8.7 - 10.3 mg/dL   Total Protein 7.1 6.0 - 8.5 g/dL   Albumin 4.5 3.9 - 4.9 g/dL   Globulin, Total 2.6 1.5 - 4.5 g/dL   Bilirubin Total 0.2 0.0 - 1.2 mg/dL   Alkaline Phosphatase 70 44 - 121 IU/L   AST 25 0 - 40 IU/L   ALT 23 0 - 32 IU/L  07/12/2023    8:08 AM 05/10/2023    3:48 PM 04/18/2023    2:30 PM 04/10/2023    9:53 AM 04/03/2023    1:13 PM  Depression screen PHQ 2/9  Decreased Interest 0 0 0 0 0  Down, Depressed, Hopeless 0 0 0 0 0  PHQ - 2 Score 0 0 0 0 0  Altered sleeping 0 0 0  0  Tired, decreased energy 0 0 0  0  Change in appetite 0 0 0  0  Feeling bad or failure about yourself  0 0 0  0  Trouble concentrating 0 0 0  0  Moving slowly or fidgety/restless 0 0 0  0  Suicidal thoughts 0 0 0  0  PHQ-9 Score 0 0 0  0  Difficult doing work/chores Not difficult at all  Not difficult at all  Not difficult at all       07/12/2023    8:08 AM 05/10/2023    3:49 PM 04/18/2023    2:31 PM 04/03/2023    1:13 PM  GAD 7 : Generalized Anxiety Score  Nervous, Anxious, on Edge 0 0 0 0  Control/stop worrying 0 0 0 0  Worry too much - different things 0 0 0 0  Trouble relaxing 0 0 0 0  Restless 0 0 0 0  Easily annoyed or irritable 0 0 0 0  Afraid -  awful might happen  0 0 0  Total GAD 7 Score  0 0 0  Anxiety Difficulty  Not difficult at all Not difficult at all Not difficult at all   Pertinent labs & imaging results that were available during my care of the patient were reviewed by me and considered in my medical decision making.  Assessment & Plan:  Margaret Dixon was seen today for medical management of chronic issues.  Diagnoses and all orders for this visit: 1. Prediabetes Well controlled. Labs as below. Will communicate results to patient once available. Will await results to determine next steps.  Continue current regimen. Encouraged patient in diet and lifestyle modifications.  - CBC with Differential/Platelet; Future - CMP14+EGFR; Future - Bayer DCA Hb A1c Waived; Future  2. Pure hypertriglyceridemia Continues on Lipitor. Has labs ordered by Eden Emms, MD. Will continue to follow.   3. Anemia due to vitamin B12 deficiency, unspecified B12 deficiency type B12 injection provided today during visit.  Labs as below. Will communicate results to patient once available. Will await results to determine next steps. Will repeat labs given symptom of shortness of breath.  - CBC with Differential/Platelet; Future - CMP14+EGFR; Future  4. Vitamin B12 deficiency As above.   5. Vitamin D deficiency Labs as below. Will communicate results to patient once available. Will await results to determine next steps.  Continue OTC supplementation.  - VITAMIN D 25 Hydroxy (Vit-D Deficiency, Fractures); Future  6. Elevated blood pressure reading without diagnosis of hypertension Elevated BP today in office. Discussed with patient monitoring BP at home. Provided BP log to patient in clinic today. Instructed pt to take BP first thing in the morning after sitting for 5 minutes with feet flat on the floor, arm at heart level. Discussed with patient options for BP cuff at [facility], [facility], [facility]. Pt verbalized they were able to obtain BP cuff. Will  review measurements with patient at follow up and determine plan for BP management.  - For home use only DME Other see comment  7. Former tobacco use Praised patient on her  ability to quit. Encouraged her to continue to abstain.    Continue all other maintenance medications.  Follow up plan: Return in about 6 months (around 01/09/2024) for Chronic Condition Follow up.  Continue healthy lifestyle choices, including diet (rich in fruits, vegetables, and lean proteins, and low in salt and simple carbohydrates) and exercise (at least 30 minutes of moderate physical activity daily).  Written and verbal instructions provided   The above assessment and management plan was discussed with the patient. The patient verbalized understanding of and has agreed to the management plan. Patient is aware to call the clinic if they develop any new symptoms or if symptoms persist or worsen. Patient is aware when to return to the clinic for a follow-up visit. Patient educated on when it is appropriate to go to the emergency department.   Neale Burly, DNP-FNP [facility] [address] [place], [place] [address] [phone]

## 2023-07-18 ENCOUNTER — Ambulatory Visit
Admission: RE | Admit: 2023-07-18 | Discharge: 2023-07-18 | Disposition: A | Discharge status: Home / Self Care | Payer: Managed Care, Other (non HMO) | Source: Ambulatory Visit | Attending: Family Medicine | Admitting: Family Medicine

## 2023-07-18 ENCOUNTER — Other Ambulatory Visit: Payer: Self-pay | Admitting: Family Medicine

## 2023-07-18 DIAGNOSIS — R928 Other abnormal and inconclusive findings on diagnostic imaging of breast: Secondary | ICD-10-CM

## 2023-07-18 DIAGNOSIS — N6489 Other specified disorders of breast: Secondary | ICD-10-CM

## 2023-07-19 ENCOUNTER — Other Ambulatory Visit: Payer: Self-pay | Admitting: *Deleted

## 2023-07-19 ENCOUNTER — Telehealth: Payer: Self-pay | Admitting: Family Medicine

## 2023-07-19 NOTE — Telephone Encounter (Signed)
Contacted patient.  She is asking if Jerrel Ivory can refill her estradiol

## 2023-07-19 NOTE — Telephone Encounter (Signed)
Copied from CRM [id number]. Topic: Clinical - Medication Question >> Jul 19, 2023 11:51 AM Joanette Gula wrote: Reason for CRM: patient has a question on Drisdol..Please Call her back at  [phone]. >> Jul 19, 2023 11:55 AM Joanette Gula wrote: Reason for CRM: patient has a question on Drisdol..Please Call her back at  [phone].

## 2023-07-22 NOTE — Telephone Encounter (Signed)
Copied from CRM [id number]. Topic: Clinical - Medication Refill >> Jul 22, 2023 10:00 AM Hendricks Limes wrote: Most Recent Primary Care Visit:  Provider: Neale Burly CAPPS  Department: [facility]  Visit Type: OFFICE VISIT  Date: 07/12/2023  Medication: Estridol (59m))  Has the patient contacted their pharmacy? Yes (Agent: If no, request that the patient contact the pharmacy for the refill. If patient does not wish to contact the pharmacy document the reason why and proceed with request.) (Agent: If yes, when and what did the pharmacy advise?)  Is this the correct pharmacy for this prescription? Yes If no, delete pharmacy and type the correct one.  This is the patient's preferred pharmacy:  [id number] [place] [place] - [address] [place] [address] Phone: [phone] Fax: [phone]   Has the prescription been filled recently? No  Is the patient out of the medication? Yes  Has the patient been seen for an appointment in the last year OR does the patient have an upcoming appointment? Yes  Can we respond through MyChart? Yes  Agent: Please be advised that Rx refills may take up to 3 business days. We ask that you follow-up with your pharmacy.

## 2023-07-22 NOTE — Telephone Encounter (Signed)
Patient came by the office to see if Margaret Dixon had refilled her Estridol 59m. Has been waiting since 11-22 for refill. Please call.

## 2023-07-22 NOTE — Telephone Encounter (Signed)
Informed pt message has been sent to [facility] but she is off today but will return tomorrow. Pt verbalized understanding

## 2023-07-23 NOTE — Telephone Encounter (Signed)
Returned call to patient. Plan to reach out to cardiology to discuss risk of medication. Patient plans to schedule annual follow up with cardiology this week and discuss medication as well.

## 2023-07-23 NOTE — Telephone Encounter (Signed)
Pt is very upset and states she doesn't need to pay extra for a specialist since she has had a hysterectomy and doesn't even have pap smears anymore. I tried to advise pt that since Estradiol is hormone replacement therapy and with her CAD and NSTEMI and increased risk per Chales Abrahams message she would need to see GYN. Pt is completely out of medication and is very upset and would like Jerrel Ivory to call her back personally so I advised pt Jerrel Ivory is seeing patients and would get back to her as soon as she could and the pt states she would come in office after work if she doesn't get a call back.

## 2023-07-24 ENCOUNTER — Ambulatory Visit: Payer: Managed Care, Other (non HMO)

## 2023-07-29 ENCOUNTER — Ambulatory Visit
Admission: RE | Admit: 2023-07-29 | Discharge: 2023-07-29 | Disposition: A | Discharge status: Home / Self Care | Payer: Managed Care, Other (non HMO) | Source: Ambulatory Visit | Attending: Family Medicine | Admitting: Family Medicine

## 2023-07-29 DIAGNOSIS — N6489 Other specified disorders of breast: Secondary | ICD-10-CM

## 2023-07-29 HISTORY — PX: BREAST BIOPSY: SHX20

## 2023-07-30 LAB — SURGICAL PATHOLOGY

## 2023-08-09 ENCOUNTER — Ambulatory Visit: Payer: Managed Care, Other (non HMO)

## 2023-09-17 ENCOUNTER — Other Ambulatory Visit: Payer: Self-pay | Admitting: Cardiovascular Disease

## 2023-09-27 NOTE — Progress Notes (Signed)
 Cardiology Office Note:    Date:  10/07/2023   ID:  Margaret Dixon, DOB 07/15/1961, MRN [id number]  PCP:  Chrystine Crate, FNP   [facility] Providers Cardiologist:  Janelle Mediate, MD}    Referring MD: Chrystine Crate*   Chief Complaint: post hospital f/u CAD s/p DES to LCx  History of Present Illness:    Margaret Dixon is a 63 y.o. female with a hx of ACS, CAD s/p NSTEMI, hyperlipidemia, and tobacco abuse.   On 12/02/21, she presented to ED with substernal chest pain with elevated hs troponin at 83. Cardiac catheterization revealed single vessel obstructive CAD involving mid LCx 90% stenosed, successful PCI of mid LCx with DES x 1, normal LVEDP, continue DAPT for 1 year. She was discharged home on 12/04/21.  Post hospital had some dyspnea and fibromyalgia Brillinta changed to plavix  Lipitor  d/c and improved Given CAD told to start lower dose crestor but refused She is back at work as CNA Quit smoking since d/c as well     LDL was 89 04/03/23 she was intolerant to crestor and higher dose lipitor  Willing to start PSK9   Myovue 09/27/22 normal no ischemia EF 71% Echo 09/27/22 EF 60-65% Normal RV AV sclerosis    Called office 8/24 with [facility] while at work 30 minutes Sharp left sided Took one nitro with some relief Had just finished lunch and was getting resident dressed for the day She was anxious 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 She has bad vasomotor symptoms when not on it   Past Medical History:  Diagnosis Date   Constipation    Depression    due to Fibromyalgia   Diarrhea    Fibromyalgia    Hormone replacement therapy, postmenopausal    PONV (postoperative nausea and vomiting)     Past Surgical History:  Procedure Laterality Date   ABDOMINAL ADHESION SURGERY  07/28/2015    APPENDECTOMY     BREAST BIOPSY Right 07/29/2023   MM RT BREAST BX W LOC DEV 1ST LESION IMAGE BX SPEC STEREO GUIDE 07/29/2023 [facility] MAMMOGRAPHY   CHOLECYSTECTOMY     COLONOSCOPY     CORONARY STENT INTERVENTION N/A 12/04/2021   Procedure: CORONARY STENT INTERVENTION;  Surgeon: Swaziland, Maragret Vanacker M, MD;  Location: [facility]AS[facility];  Service: Cardiovascular;  Laterality: N/A;  circumflex   INCISION AND DRAINAGE ABSCESS N/A 11/03/2015   Procedure: EXCISION CHRONIC ABDOMINAL WALL STITCH ABSCESS;  Surgeon: Oza Blumenthal, MD;  Location: [facility];  Service: General;  Laterality: N/A;   LAPAROTOMY N/A 07/28/2015   Procedure: EXPLORATORY LAPAROTOMY;  Surgeon: Oza Blumenthal, MD;  Location: [facility] OR;  Service: General;  Laterality: N/A;   LEFT HEART CATH AND CORONARY ANGIOGRAPHY N/A 12/04/2021   Procedure: LEFT HEART CATH AND CORONARY ANGIOGRAPHY;  Surgeon: Swaziland, Jayro Mcmath M, MD;  Location: [facility] INVASIVE CV LAB;  Service: Cardiovascular;  Laterality: N/A;   LYSIS OF ADHESION N/A 07/28/2015   Procedure: LYSIS OF ADHESION;  Surgeon: Oza Blumenthal, MD;  Location: [facility] OR;  Service: General;  Laterality: N/A;   TONSILLECTOMY     VAGINAL HYSTERECTOMY      Current Medications: Current Meds  Medication Sig   Ascorbic Acid (VITAMIN C GUMMIES PO) Take by mouth.   aspirin  (ASPIRIN  LOW DOSE) 81 MG chewable tablet CHEW 1  TABLET BY MOUTH DAILY.   atorvastatin  (LIPITOR ) 10 MG tablet TAKE 1 TABLET BY MOUTH EVERY DAY   cyanocobalamin  (,VITAMIN B-12,) 1000 MCG/ML injection Inject 1,000 mcg into the muscle every 14 (fourteen) days.   estradiol  (ESTRACE ) 1 MG tablet Take 1 mg by mouth daily.   Multiple Vitamins-Minerals (MULTIVITAMIN GUMMIES ADULT) CHEW Chew by mouth.   nitroGLYCERIN  (NITROSTAT ) 0.4 MG SL tablet Place 1 tablet (0.4 mg total) under the tongue every 5 (five) minutes as needed for chest pain. PLACE 1 TABLET UNDER THE TONGUE EVERY 5 MIN X 3 DOSES AS NEEDED FOR CHEST PAIN.   tiZANidine  (ZANAFLEX )  4 MG tablet Take 1 tablet (4 mg total) by mouth every 6 (six) hours as needed for muscle spasms.   Vitamin D , Ergocalciferol , (DRISDOL) 50000 units CAPS capsule Take 50,000 Units by mouth every 7 (seven) days.   Current Facility-Administered Medications for the 10/07/23 encounter (Office Visit) with Loyde Rule, MD  Medication   cyanocobalamin  (VITAMIN B12) injection 1,000 mcg     Allergies:   Penicillins, Repatha  [evolocumab ], Shellfish allergy, Ticagrelor , Cephalexin, and Clarithromycin   Social History   Socioeconomic History   Marital status: Divorced    Spouse name: Not on file   Number of children: 2   Years of education: Not on file   Highest education level: GED or equivalent  Occupational History   Not on file  Tobacco Use   Smoking status: Every Day    Current packs/day: 0.00    Average packs/day: 0.3 packs/day for 8.0 years (2.4 ttl pk-yrs)    Types: Cigarettes    Start date: 07/26/2006    Last attempt to quit: 07/26/2014    Years since quitting: 9.2   Smokeless tobacco: Never  Substance and Sexual Activity   Alcohol use: Yes    Comment: socially   Drug use: No   Sexual activity: Not on file  Other Topics Concern   Not on file  Social History Narrative   Lives alone.  Works at [employer], sport and exercise.     Social Drivers of Corporate investment banker Strain: Low Risk  (08/12/2023)   Received from [facility]   Overall Financial Resource Strain (CARDIA)    Difficulty of Paying Living Expenses: Not hard at all  Recent Concern: Financial Resource Strain - Medium Risk (07/11/2023)   Overall Financial Resource Strain (CARDIA)    Difficulty of Paying Living Expenses: Somewhat hard  Food Insecurity: No Food Insecurity (08/12/2023)   Received from [facility]   Hunger Vital Sign    Worried About Running Out of Food in the Last Year: Never true    Ran Out of Food in the Last Year: Never true  Transportation Needs: No Transportation Needs (08/12/2023)    Received from [facility] - Transportation    Lack of Transportation (Medical): No    Lack of Transportation (Non-Medical): No  Physical Activity: Insufficiently Active (07/11/2023)   Exercise Vital Sign    Days of Exercise per Week: 3 days    Minutes of Exercise per Session: 30 min  Stress: No Stress Concern Present (07/11/2023)   [employer] of Occupational Health - Occupational Stress Questionnaire    Feeling of Stress : Not at all  Social Connections: Moderately Integrated (07/11/2023)   Social Connection and Isolation Panel [NHANES]    Frequency of Communication with Friends and Family: More than three times a week    Frequency of Social Gatherings with Friends and Family:  Patient declined    Attends Religious Services: More than 4 times per year    Active Member of Clubs or Organizations: Yes    Attends Engineer, structural: More than 4 times per year    Marital Status: Divorced     Family History: The patient's family history includes Cancer in her mother; Colon polyps in her father; Coronary artery disease in her paternal grandfather; Coronary artery disease (age of onset: 68) in her father; Diabetes in her father; Emphysema in her father. There is no history of Breast cancer.  ROS:   Please see the history of present illness.  All other systems reviewed and are negative.  Labs/Other Studies Reviewed:    The following studies were reviewed today:  Myovue 09/27/22      The study is normal. The study is low risk.   No ST deviation was noted.   LV perfusion is normal. There is no evidence of ischemia. There is no evidence of infarction.   Left ventricular function is normal. Nuclear stress EF: 71 %. The left ventricular ejection fraction is hyperdynamic (>65%). End diastolic cavity size is normal. End systolic cavity size is normal.  Echo:  09/27/22  IMPRESSIONS     1. Left ventricular ejection fraction, by estimation, is 60 to 65%. The  left  ventricle has normal function. The left ventricle has no regional  wall motion abnormalities. Left ventricular diastolic parameters were  normal.   2. Right ventricular systolic function is normal. The right ventricular  size is normal. There is normal pulmonary artery systolic pressure. The  estimated right ventricular systolic pressure is 25.1 mmHg.   3. The mitral valve is normal in structure. Trivial mitral valve  regurgitation.   4. The aortic valve is tricuspid. There is mild thickening of the aortic  valve. Aortic valve regurgitation is not visualized. Aortic valve  sclerosis is present, with no evidence of aortic valve stenosis.   5. The inferior vena cava is normal in size with greater than 50%  respiratory variability, suggesting right atrial pressure of 3 mmHg.    Normal resting and stress perfusion. No ischemia or infarction EF 71%   LHC 12/04/21    1st Diag lesion is 60% stenosed.   1st Mrg lesion is 50% stenosed.   Mid Cx lesion is 90% stenosed.   A drug-eluting stent was successfully placed using a STENT ONYX FRONTIER 2.5X15.   Post intervention, there is a 0% residual stenosis.   LV end diastolic pressure is normal.   Single vessel obstructive CAD involving the mid LCx Normal LVEDP Successful PCI of the mid LCx with DES x 1   Plan: DAPT for one year. Anticipate same day DC after 6 hours   Echo 12/03/21  Left Ventricle: Left ventricular ejection fraction, by estimation, is 60  to 65%. The left ventricle has normal function. The left ventricle has no  regional wall motion abnormalities. The left ventricular internal cavity  size was normal in size. There is mild left ventricular hypertrophy.  Left ventricular diastolic parameters are indeterminate.  Right Ventricle: The right ventricular size is normal. No increase in  right ventricular wall thickness. Right ventricular systolic function is  normal. There is normal pulmonary artery systolic pressure. The tricuspid   regurgitant velocity is 1.64 m/s, and   with an assumed right atrial pressure of 3 mmHg, the estimated right  ventricular systolic pressure is 13.8 mmHg.  Left Atrium: Left atrial size was normal in size.  Right  Atrium: Right atrial size was normal in size.  Pericardium: There is no evidence of pericardial effusion.  Mitral Valve: The mitral valve is grossly normal. No evidence of mitral  valve regurgitation.  Tricuspid Valve: The tricuspid valve is grossly normal. Tricuspid valve  regurgitation is not demonstrated.  Aortic Valve: The aortic valve is grossly normal. Aortic valve  regurgitation is not visualized.  Pulmonic Valve: The pulmonic valve was not well visualized. Pulmonic valve  regurgitation is not visualized.  Aorta: The aortic root and ascending aorta are structurally normal, with  no evidence of dilitation.  Venous: The inferior vena cava is normal in size with greater than 50%  respiratory variability, suggesting right atrial pressure of 3 mmHg.  IAS/Shunts: No atrial level shunt detected by color flow Doppler.     Recent Labs: 04/03/2023: Hemoglobin 14.9; Platelets 290; TSH 2.340 05/28/2023: ALT 23; BUN 17; Creatinine, Ser 1.00; Potassium 4.5; Sodium 140  Recent Lipid Panel    Component Value Date/Time   CHOL 194 04/03/2023 1353   TRIG 334 (H) 04/03/2023 1353   HDL 50 04/03/2023 1353   CHOLHDL 3.9 04/03/2023 1353   CHOLHDL 4.9 12/02/2021 2102   VLDL 43 (H) 12/02/2021 2102   LDLCALC 89 04/03/2023 1353    Physical Exam:    VS:  BP 128/82   Pulse (!) 59   Ht 5\' 4"  (1.626 m)   Wt 159 lb 9.6 oz (72.4 kg)   SpO2 99%   BMI 27.40 kg/m     Wt Readings from Last 3 Encounters:  10/07/23 159 lb 9.6 oz (72.4 kg)  07/12/23 158 lb (71.7 kg)  05/10/23 151 lb (68.5 kg)     GEN:  Well nourished, well developed in no acute distress HEENT: Normal NECK: No JVD; No carotid bruits CARDIAC: RRR, no murmurs, rubs, gallops RESPIRATORY:  Clear to auscultation without rales,  wheezing or rhonchi  ABDOMEN: Soft, non-tender, non-distended MUSCULOSKELETAL:  No edema; No deformity. 2+ pedal pulses, equal bilaterally SKIN: Warm and dry NEUROLOGIC:  Alert and oriented x 3 PSYCHIATRIC:  Normal affect   EKG:  10/07/2023 NSR Rate 62 normal   Diagnoses:    1. Coronary artery disease involving native coronary artery of native heart without angina pectoris     Assessment and Plan:     CAD s/p ACS s/p DES to [facility] without angina: Single-vessel obstructive CAD with successful PCI of mid LCx with DES x1.  Plan to continue DAPT for 1 year 12/05/22   She is tolerating aspirin  and plavix . She stopped Brillinta due to dyspnea and cost. Was supposed to be on coreg  after last visit with NP 12/18/21 but did not start Observe Normal myovue 09/27/22   ECG today is normal SR rate 59   Tobacco abuse: She has completely stopped smoking.  I congratulated her on this achievement.  Hyperlipidemia LDL goal < 70/Hypertriglyceridemia: LDL 133 01/26/22 Myalgias on high dose lipitor  Told to start crestor 10 mg she will see lipid clinic today  4.  Thyroid :  TSH was  close to 8 a year aog then 2.3 6 months ago f/u with primary to discuss replacement if TSH goes over 5 again  5.  Estrogen:  she has been estrogen for 35 years and has severe vasomotor symptoms when not on it. The WHI and HERS data have been controversial and lower risk in patients who started taking early post hysterectomy as opposed to later I think it would be ok for patient to use trans dermal formulation  of estrogen if she feels her vasomotor symptoms are intolerable. Highest risk is DVT not MI. With her not smoking and on PSK9 plaque stability should be better even with estrogen    Lipid Clinic  Start PSK 9   Disposition: F/U in a year      Signed, Janelle Mediate, MD  10/07/2023 8:32 AM    [facility]

## 2023-10-07 ENCOUNTER — Ambulatory Visit: Payer: Managed Care, Other (non HMO) | Attending: Cardiovascular Disease | Admitting: Cardiovascular Disease

## 2023-10-07 ENCOUNTER — Encounter: Payer: Self-pay | Admitting: Cardiovascular Disease

## 2023-10-07 VITALS — BP 128/82 | HR 59 | Ht 64.0 in | Wt 159.6 lb

## 2023-10-07 DIAGNOSIS — E785 Hyperlipidemia, unspecified: Secondary | ICD-10-CM

## 2023-10-07 DIAGNOSIS — I2510 Atherosclerotic heart disease of native coronary artery without angina pectoris: Secondary | ICD-10-CM

## 2023-10-07 DIAGNOSIS — Z720 Tobacco use: Secondary | ICD-10-CM | POA: Diagnosis not present

## 2023-10-07 DIAGNOSIS — R7989 Other specified abnormal findings of blood chemistry: Secondary | ICD-10-CM

## 2023-10-07 MED ORDER — ATORVASTATIN CALCIUM 10 MG PO TABS: 10.0000 mg | ORAL_TABLET | Freq: Every day | ORAL | 3 refills | Status: DC

## 2023-10-07 NOTE — Patient Instructions (Addendum)
 Medication Instructions:  Your physician recommends that you continue on your current medications as directed. Please refer to the Current Medication list given to you today.  *If you need a refill on your cardiac medications before your next appointment, please call your pharmacy*  Lab Work: If you have labs (blood work) drawn today and your tests are completely normal, you will receive your results only by: MyChart Message (if you have MyChart) OR A paper copy in the mail If you have any lab test that is abnormal or we need to change your treatment, we will call you to review the results.  Testing/Procedures: None ordered today.  Follow-Up: At [facility], you and your health needs are our priority.  As part of our continuing mission to provide you with exceptional heart care, we have created designated Provider Care Teams.  These Care Teams include your primary Cardiologist (physician) and Advanced Practice Providers (APPs -  Physician Assistants and Nurse Practitioners) who all work together to provide you with the care you need, when you need it.  We recommend signing up for the patient portal called "MyChart".  Sign up information is provided on this After Visit Summary.  MyChart is used to connect with patients for Virtual Visits (Telemedicine).  Patients are able to view lab/test results, encounter notes, upcoming appointments, etc.  Non-urgent messages can be sent to your provider as well.   To learn more about what you can do with MyChart, go to [email or web address].    Your next appointment:   1 year(s)  Provider:   Janelle Mediate, MD     You have been referred to Lipid Clinic.  Other Instructions    1st Floor: - Lobby - Registration  - Pharmacy  - Lab - Cafe  2nd Floor: - PV Lab - Diagnostic Testing (echo, CT, nuclear med)  3rd Floor: - Vacant  4th Floor: - TCTS (cardiothoracic surgery) - AFib Clinic - Structural Heart Clinic - Vascular Surgery   - Vascular Ultrasound  5th Floor: - HeartCare Cardiology (general and EP) - Clinical Pharmacy for coumadin, hypertension, lipid, weight-loss medications, and med management appointments    Valet parking services will be available as well.

## 2023-10-17 ENCOUNTER — Other Ambulatory Visit: Payer: Self-pay | Admitting: Cardiology

## 2023-11-25 ENCOUNTER — Telehealth: Payer: Self-pay

## 2023-11-25 DIAGNOSIS — E781 Pure hyperglyceridemia: Secondary | ICD-10-CM

## 2023-11-25 NOTE — Telephone Encounter (Signed)
 I spoke to pt and advised Jerrel Ivory wasn't here in office today and would route to her for her to address tomorrow. There are some future orders in that pt was supposed to have already came back in and have drawn but pt wants cholesterol levels checked for Cardiologist. If you would like to order other labs please let me know so we can call pt back to schedule for her lab.

## 2023-11-25 NOTE — Telephone Encounter (Signed)
 Placed order for lipid panel. Please complete previous labs and lipid panel.

## 2023-11-25 NOTE — Telephone Encounter (Unsigned)
 Copied from CRM [id number]. Topic: Clinical - Lab/Test Results >> Nov 25, 2023 11:40 AM Margaret Dixon wrote: Reason for CRM: Patient wants to get labs checked before appointment with heart doctor on 4/4, please place orders, patient is scheduled for labs tomorrow per her request, please reach out to the patient if there are any issues.

## 2023-11-26 ENCOUNTER — Other Ambulatory Visit

## 2023-11-26 NOTE — Telephone Encounter (Signed)
Patient cancelled lab appt

## 2023-11-29 ENCOUNTER — Ambulatory Visit: Payer: Managed Care, Other (non HMO) | Attending: Internal Medicine | Admitting: Pharmacist

## 2023-11-29 DIAGNOSIS — E782 Mixed hyperlipidemia: Secondary | ICD-10-CM

## 2023-11-29 NOTE — Assessment & Plan Note (Signed)
 Assessment: LDL-C is above goal of less than 55 Intolerant to several statins and Repatha Last labs in August, patient on atorvastatin at the time Will get baseline labs today Discussed medication options including Nexlizet, although I assume will have the same issue with deductible due to the fact that we will co-pay card as per fill limitations on it, Leqvio or ezetimibe.  Plan: Patient would like to try [facility] form signed and will submit Will refer her to [facility] infusion center if affordable I believe she will be able to use co-pay card which will help with deductible

## 2023-11-29 NOTE — Progress Notes (Signed)
 Patient ID: Margaret Dixon                 DOB: 10/29/1960                    MRN: [id number]     HPI: Margaret Dixon is a 63 y.o. female patient referred to lipid clinic by Dr. Eden Emms. PMH is significant for CAD, NSTEMI, HLD, tobacco abuse, and fibromyalgia. Pt had NSTEMI 12/02/21 and LHC showed 1st diag 60% stenosed, 1st Mrg 50% stenosed, mid Cx 90% s/p DES x 1. Pt has intolerances to pravastatin and atorvastatin. At visit with Marcelino Duster on 12/18/21 plan was to start rosuvastatin 10 mg daily. Pt seen by Dr. Eden Emms 03/23/22 and was referred to lipid clinic. Patient seen in lipid clinic 04/20/22 and was started on Repatha. Repeat lipid panel showed very little improvement in LDL-C. Patient also reported SOB which she wasn't sure if it was from COVID or Repatha. We decided to stay on Repatha for 2 more months and then repeat labs. However, when she was seen by Alden Server a few weeks later, she reported hives and possible angioedema with Repatha and stopped therapy.   I saw patient in lipid clinic last on 09/06/22. We tried to start her on Nexlizet, but she could not afford. She decided to retry atorvastatin at 10mg . LDL-C in August was 89.   Patient presents today to lipid clinic.  She reports increasing shortness of breath with atorvastatin.  She is no longer taking.  Wishes to get baseline labs today.  She has same plan as last year with a $2000 deductible.   Discussed medication options including Nexlizet, although I assume will have the same issue with deductible due to the fact that we will co-pay card as per fill limitations on it, Leqvio or ezetimibe.   [name]: [id number] BIN: [id number]  DOB: [birth year]  Group ID: [id number] PCN: [id number]  Legal sex: F  Group name: [facility]  Address: [address]     [place] [address]    Current Medications: None Intolerances: pravastatin (diarrhea), atorvastatin 80 mg daily (myalgias), rosuvastatin (refused to take), Repatha (angioedema?  Hives, shortness of breath,poor response), atorvastatin (increasing shortness of breath) Risk Factors: CAD, NSTEMI, HLD, smoking, premature ASCVD LDL goal: <55  Diet:  Has gained weight since she quit smoking 2 years ago but she is trying to eat healthy  Exercise:  Very active throughout the day- takes care of alzheimer's patients Power walks at work over 5000 steps    Family History: mid LCx 90% stenosed, successful PCI of mid LCx with DES x 1  Social History: quit 3 months ago, no alcohol  Labs: 04/03/23 TC 194, TG 334, LDL-C 89 HDL 50 (atorvastatin 10 mg daily) 07/16/2022 TC 195, TG 100, HDL 51 LDL 126 ApoB 105 (Repatha 140mg ) 01/26/22 lipid profile - TC 202, TG 114, HDL 48, LDL 133 (no LLT)  Past Medical History:  Diagnosis Date   Constipation    Depression    due to Fibromyalgia   Diarrhea    Fibromyalgia    Hormone replacement therapy, postmenopausal    PONV (postoperative nausea and vomiting)     Current Outpatient Medications on File Prior to Visit  Medication Sig Dispense Refill   Ascorbic Acid (VITAMIN C GUMMIES PO) Take by mouth.     ASPIRIN LOW DOSE 81 MG chewable tablet CHEW 1 TABLET BY MOUTH EVERY DAY 90 tablet  3   atorvastatin (LIPITOR) 10 MG tablet Take 1 tablet (10 mg total) by mouth daily. 90 tablet 3   cyanocobalamin (,VITAMIN B-12,) 1000 MCG/ML injection Inject 1,000 mcg into the muscle every 14 (fourteen) days.     estradiol (ESTRACE) 1 MG tablet Take 1 mg by mouth daily.     Multiple Vitamins-Minerals (MULTIVITAMIN GUMMIES ADULT) CHEW Chew by mouth.     nitroGLYCERIN (NITROSTAT) 0.4 MG SL tablet Place 1 tablet (0.4 mg total) under the tongue every 5 (five) minutes as needed for chest pain. PLACE 1 TABLET UNDER THE TONGUE EVERY 5 MIN X 3 DOSES AS NEEDED FOR CHEST PAIN. 25 tablet 1   tiZANidine (ZANAFLEX) 4 MG tablet Take 1 tablet (4 mg total) by mouth every 6 (six) hours as needed for muscle spasms. 30 tablet 1   Vitamin D, Ergocalciferol, (DRISDOL) 50000  units CAPS capsule Take 50,000 Units by mouth every 7 (seven) days.     Current Facility-Administered Medications on File Prior to Visit  Medication Dose Route Frequency Provider Last Rate Last Admin   clindamycin (CLEOCIN) 900 mg in dextrose 5 % 50 mL IVPB  900 mg Intravenous 60 min Pre-Op Abigail Miyamoto, MD       And   gentamicin (GARAMYCIN) 5 mg/kg in dextrose 5 % 50 mL IVPB  5 mg/kg Intravenous 60 min Pre-Op Abigail Miyamoto, MD       cyanocobalamin (VITAMIN B12) injection 1,000 mcg  1,000 mcg Intramuscular Q14 Days Arrie Senate, FNP   1,000 mcg at 07/12/23 5784    Allergies  Allergen Reactions   Penicillins Hives and Rash    Has patient had a PCN reaction causing immediate rash, facial/tongue/throat swelling, SOB or lightheadedness with hypotension: Yes Has patient had a PCN reaction causing severe rash involving mucus membranes or skin necrosis: No Has patient had a PCN reaction that required hospitalization No Has patient had a PCN reaction occurring within the last 10 years: No If all of the above answers are "NO", then may proceed with Cephalosporin use.    Repatha [Evolocumab] Shortness Of Breath    Hives, ? angioedema   Shellfish Allergy Hives and Swelling   Ticagrelor Shortness Of Breath   Cephalexin    Clarithromycin     Assessment/Plan:  1. Hyperlipidemia -  Assessment: LDL-C is above goal of less than 55 Intolerant to several statins and Repatha Last labs in August, patient on atorvastatin at the time Will get baseline labs today Discussed medication options including Nexlizet, although I assume will have the same issue with deductible due to the fact that we will co-pay card as per fill limitations on it, Leqvio or ezetimibe.  Plan: Patient would like to try [facility] form signed and will submit Will refer her to [facility] infusion center if affordable I believe she will be able to use co-pay card which will help with deductible  Thank  you,  Olene Floss, Pharm.D, BCACP, CPP [facility] [place] [address] [facility] [address], [place], [place] [address]  Phone: [phone]; Fax: [phone]

## 2023-11-30 LAB — COMPREHENSIVE METABOLIC PANEL WITH GFR
ALT: 29 IU/L (ref 0–32)
AST: 38 IU/L (ref 0–40)
Albumin: 4.4 g/dL (ref 3.9–4.9)
Alkaline Phosphatase: 78 IU/L (ref 44–121)
BUN/Creatinine Ratio: 11 — ABNORMAL LOW (ref 12–28)
BUN: 12 mg/dL (ref 8–27)
Bilirubin Total: 0.4 mg/dL (ref 0.0–1.2)
CO2: 19 mmol/L — ABNORMAL LOW (ref 20–29)
Calcium: 9.4 mg/dL (ref 8.7–10.3)
Chloride: 102 mmol/L (ref 96–106)
Creatinine, Ser: 1.11 mg/dL — ABNORMAL HIGH (ref 0.57–1.00)
Globulin, Total: 2.9 g/dL (ref 1.5–4.5)
Glucose: 137 mg/dL — ABNORMAL HIGH (ref 70–99)
Potassium: 4.2 mmol/L (ref 3.5–5.2)
Sodium: 143 mmol/L (ref 134–144)
Total Protein: 7.3 g/dL (ref 6.0–8.5)
eGFR: 56 mL/min/{1.73_m2} — ABNORMAL LOW (ref >59)

## 2023-11-30 LAB — LIPID PANEL
Chol/HDL Ratio: 4.2 ratio (ref 0.0–4.4)
Cholesterol, Total: 182 mg/dL (ref 100–199)
HDL: 43 mg/dL (ref >39)
LDL Chol Calc (NIH): 104 mg/dL — ABNORMAL HIGH (ref 0–99)
Triglycerides: 202 mg/dL — ABNORMAL HIGH (ref 0–149)
VLDL Cholesterol Cal: 35 mg/dL (ref 5–40)

## 2023-11-30 LAB — APOLIPOPROTEIN B: Apolipoprotein B: 109 mg/dL — ABNORMAL HIGH (ref <90)

## 2023-12-03 ENCOUNTER — Encounter: Payer: Self-pay | Admitting: Pharmacist

## 2023-12-04 ENCOUNTER — Other Ambulatory Visit: Payer: Self-pay | Admitting: Pharmacist

## 2023-12-04 ENCOUNTER — Telehealth: Payer: Self-pay | Admitting: Pharmacy Technician

## 2023-12-04 NOTE — Telephone Encounter (Signed)
 It looks like copay card should cover her expenses- so please move forward with PA. Thanks!

## 2023-12-04 NOTE — Telephone Encounter (Signed)
 She has Nurse, learning disability so she wont qualify for healthwell, but she will qualify for copay card. Just wanted to make sure copay card would cover the majority of cost.  Ill put the order in so you can start the PA process.

## 2023-12-04 NOTE — Telephone Encounter (Signed)
 If approved by the insurance,  I will inform [name] to enroll in the [facility] for [place] and that will assist with the cost.  She may only have the administration fee left (sometimes the insurance will cover it too). If she's not approved by the foundation we can explore more cost effective treatment. Thoughts? Margaret Dixon

## 2023-12-04 NOTE — Telephone Encounter (Signed)
 Melissa, Thanks. Berkley Harvey has been submitted and I will f/u once I have a response.  Selena Batten

## 2023-12-04 NOTE — Telephone Encounter (Signed)
 Melissa,  Please enter the treatment plan and I will submit an auth.  I wll f/u once I have a response from the insurance.  (I received a BIV from [employer] but noticed the treatment plan has not been entered yet)  Thanks Earnestine Mealing Submission: PENDING Site of care: Site of care: [facility] Payer: [employer] Medication & CPT/J Code(s) submitted: Leqvio (Inclisiran) [phone] Route of submission (phone, fax, portal):  Phone # Fax # Auth type: Buy/Bill PB Units/visits requested: 284mg  q57months x2 doses, then q29months Reference number:  Approval from:

## 2023-12-11 NOTE — Telephone Encounter (Signed)
 Any update on PA?

## 2023-12-11 NOTE — Telephone Encounter (Signed)
 Melissa, Not yet.  I will have to call for an update/status. I will f/u.  Burdette Carolin

## 2023-12-12 ENCOUNTER — Telehealth: Payer: Self-pay | Admitting: Pharmacy Technician

## 2023-12-12 NOTE — Telephone Encounter (Addendum)
 Clista [employer]: [phone]  ext: [id number]  Patient will have to get the Leqvio  from [facility] pharmacy.  Auth Submission: approved Site of care: Site of care: [facility] Payer: [employer] Medication & CPT/J Code(s) submitted: Leqvio  (Inclisiran) J1306 Route of submission (phone, fax, portal):  Phone # Fax # Auth type: Pharmacy Benefit Units/visits requested: 284MG  Q3MONTHS X2 DOSES, THEN Q6 MONTHS Reference number:  Approval from:

## 2023-12-17 ENCOUNTER — Encounter: Payer: Self-pay | Admitting: Pharmacist

## 2023-12-17 ENCOUNTER — Encounter: Payer: Self-pay | Admitting: Cardiovascular Disease

## 2023-12-17 ENCOUNTER — Telehealth: Payer: Self-pay | Admitting: Pharmacy Technician

## 2023-12-17 MED ORDER — LEQVIO 284 MG/1.5ML ~~LOC~~ SOSY: PREFILLED_SYRINGE | SUBCUTANEOUS | 1 refills | Status: DC

## 2023-12-17 NOTE — Telephone Encounter (Signed)
 Melissa, Thanks. Nothing else is needed at this moment.  I will f/u with you if anything else is needed. Apppreciate your help. Burdette Carolin

## 2023-12-17 NOTE — Telephone Encounter (Addendum)
 Dr. Nishan / Melissa,  Please send script to [facility]. Fax: [phone]. Once medication is received patient will be scheduled as soon as possible.  Patient must use [facility] pharmacy only. Fyi flag and excell sheet updated  Auth Submission: APPROVED Site of care: Site of care: [facility] Payer: [employer] Medication & CPT/J Code(s) submitted: Leqvio  (Inclisiran) J1306 Route of submission (phone, fax, portal):  Phone # Fax # Auth type: Pharmacy Benefit Units/visits requested: 284MG  Q3MONTHS X2 DOSES THEN Q6 MONTHS X1 DOSE TOTAL = 3 DOSES Reference number: [id number] Approval from: 11/27/23 to 12/03/24

## 2023-12-17 NOTE — Addendum Note (Signed)
 Addended by: Horatio Bertz D on: 12/17/2023 02:55 PM   Modules accepted: Orders

## 2023-12-17 NOTE — Telephone Encounter (Signed)
 CARD #:[id number] EPP:[id number]  Rx sent to [facility]. I put coapy card info in the sig Kim anything else we need to do to make sure it gets shipped to the infusion center?

## 2023-12-18 MED ORDER — LEQVIO 284 MG/1.5ML ~~LOC~~ SOSY: PREFILLED_SYRINGE | SUBCUTANEOUS | 1 refills | Status: AC

## 2023-12-18 NOTE — Telephone Encounter (Signed)
 Melissa, Received a call from [employer] asking for a script. Can you resend the scipt to fax: [phone].  Edwena Graham

## 2023-12-18 NOTE — Telephone Encounter (Signed)
 I couldn't find it in e-scribe so I had to print and fax it.

## 2023-12-18 NOTE — Addendum Note (Signed)
 Addended by: Osha Errico D on: 12/18/2023 04:04 PM   Modules accepted: Orders

## 2023-12-19 NOTE — Telephone Encounter (Signed)
 Melissa, Thanks your the best!!!

## 2023-12-26 ENCOUNTER — Ambulatory Visit: Admitting: *Deleted

## 2023-12-26 VITALS — BP 122/67 | HR 65 | Temp 97.7°F | Resp 16 | Ht 64.5 in | Wt 155.6 lb

## 2023-12-26 DIAGNOSIS — E782 Mixed hyperlipidemia: Secondary | ICD-10-CM

## 2023-12-26 DIAGNOSIS — I2510 Atherosclerotic heart disease of native coronary artery without angina pectoris: Secondary | ICD-10-CM | POA: Diagnosis not present

## 2023-12-26 MED ORDER — INCLISIRAN SODIUM 284 MG/1.5ML ~~LOC~~ SOSY
284.0000 mg | PREFILLED_SYRINGE | Freq: Once | SUBCUTANEOUS | Status: AC
Start: 2023-12-26 — End: 2023-12-26
  Administered 2023-12-26: 284 mg via SUBCUTANEOUS
  Filled 2023-12-26: qty 1.5

## 2023-12-26 NOTE — Progress Notes (Signed)
 Diagnosis: Hyperlipidemia  Provider:  Mannam, Praveen MD  Procedure: Injection  Leqvio  (inclisiran), Dose: 284 mg, Site: subcutaneous, Number of injections: 1  Injection Site(s): Left lower quad. abdomen  Post Care: Observation period completed  Discharge: Condition: Good, Destination: Home . AVS Provided  Performed by:  Mayme Spearman, RN

## 2023-12-26 NOTE — Patient Instructions (Signed)
 Inclisiran Injection What is this medication? INCLISIRAN (in kli SIR an) treats high cholesterol. It works by decreasing bad cholesterol (such as LDL) in your blood. Changes to diet and exercise are often combined with this medication. This medicine may be used for other purposes; ask your health care provider or pharmacist if you have questions. COMMON BRAND NAME(S): LEQVIO What should I tell my care team before I take this medication? They need to know if you have any of these conditions: An unusual or allergic reaction to inclisiran, other medications, foods, dyes, or preservatives Pregnant or trying to get pregnant Breast-feeding How should I use this medication? This medication is injected under the skin. It is given by your care team in a hospital or clinic setting. Talk to your care team about the use of this medication in children. Special care may be needed. Overdosage: If you think you have taken too much of this medicine contact a poison control center or emergency room at once. NOTE: This medicine is only for you. Do not share this medicine with others. What if I miss a dose? Keep appointments for follow-up doses. It is important not to miss your dose. Call your care team if you are unable to keep an appointment. What may interact with this medication? Interactions are not expected. This list may not describe all possible interactions. Give your health care provider a list of all the medicines, herbs, non-prescription drugs, or dietary supplements you use. Also tell them if you smoke, drink alcohol, or use illegal drugs. Some items may interact with your medicine. What should I watch for while using this medication? Visit your care team for regular checks on your progress. Tell your care team if your symptoms do not start to get better or if they get worse. You may need blood work while you are taking this medication. What side effects may I notice from receiving this  medication? Side effects that you should report to your care team as soon as possible: Allergic reactions--skin rash, itching, hives, swelling of the face, lips, tongue, or throat Side effects that usually do not require medical attention (report these to your care team if they continue or are bothersome): Joint pain Pain, redness, or irritation at injection site This list may not describe all possible side effects. Call your doctor for medical advice about side effects. You may report side effects to FDA at 1-800-FDA-1088. Where should I keep my medication? This medication is given in a hospital or clinic. It will not be stored at home. NOTE: This sheet is a summary. It may not cover all possible information. If you have questions about this medicine, talk to your doctor, pharmacist, or health care provider.  2024 Elsevier/Gold Standard (2023-07-26 00:00:00)

## 2024-02-04 ENCOUNTER — Ambulatory Visit: Payer: Self-pay

## 2024-02-04 NOTE — Telephone Encounter (Signed)
 FYI Only or Action Required?: FYI only for provider  Patient was last seen in primary care on 07/12/2023 by Chrystine Crate, FNP. Called Nurse Triage reporting Back Pain. Symptoms began several days ago. Interventions attempted: Other: increased water. Symptoms are: Dark urine, smell, lower back paingradually worsening.  Triage Disposition: See PCP When Office is Open (Within 3 Days)  Patient/caregiver understands and will follow disposition?: Yes          Copied from CRM [id number]. Topic: Clinical - Red Word Triage >> Feb 04, 2024  4:15 PM Stanly Early wrote: Patient stated lower back pain,bladder infection, pee dark yellow Reason for Disposition  [1] MODERATE back pain (e.g., interferes with normal activities) AND [2] present > 3 days  Answer Assessment - Initial Assessment Questions 1. ONSET: "When did the pain begin?"      A couple of days 2. LOCATION: "Where does it hurt?" (upper, mid or lower back)     Lower back all the way across 3. SEVERITY: "How bad is the pain?"  (e.g., Scale 1-10; mild, moderate, or severe)   - MILD (1-3): Doesn't interfere with normal activities.    - MODERATE (4-7): Interferes with normal activities or awakens from sleep.    - SEVERE (8-10): Excruciating pain, unable to do any normal activities.      moderate 4. PATTERN: "Is the pain constant?" (e.g., yes, no; constant, intermittent)      Constant - can come and go 5. RADIATION: "Does the pain shoot into your legs or somewhere else?"     no 6. CAUSE:  "What do you think is causing the back pain?"      Kidney 7. BACK OVERUSE:  "Any recent lifting of heavy objects, strenuous work or exercise?"     no 8. MEDICINES: "What have you taken so far for the pain?" (e.g., nothing, acetaminophen , NSAIDS)     tylenol  9. NEUROLOGIC SYMPTOMS: "Do you have any weakness, numbness, or problems with bowel/bladder control?"     Urine leakage 10. OTHER SYMPTOMS: "Do you have any other symptoms?" (e.g., fever,  abdomen pain, burning with urination, blood in urine)       Urine, odor  Protocols used: Back Pain-A-AH

## 2024-02-05 ENCOUNTER — Encounter: Payer: Self-pay | Admitting: Family Medicine

## 2024-02-05 ENCOUNTER — Ambulatory Visit: Admitting: Family Medicine

## 2024-02-05 VITALS — BP 120/70 | HR 75 | Temp 98.3°F | Ht 64.5 in | Wt 152.0 lb

## 2024-02-05 DIAGNOSIS — R350 Frequency of micturition: Secondary | ICD-10-CM

## 2024-02-05 DIAGNOSIS — R8290 Unspecified abnormal findings in urine: Secondary | ICD-10-CM | POA: Diagnosis not present

## 2024-02-05 LAB — MICROSCOPIC EXAMINATION
Bacteria, UA: NONE SEEN
Epithelial Cells (non renal): NONE SEEN /HPF (ref 0–10)

## 2024-02-05 LAB — URINALYSIS, ROUTINE W REFLEX MICROSCOPIC
Bilirubin, UA: NEGATIVE
Glucose, UA: NEGATIVE
Leukocytes,UA: NEGATIVE
Nitrite, UA: NEGATIVE
Protein,UA: NEGATIVE
Specific Gravity, UA: 1.025 (ref 1.005–1.030)
Urobilinogen, Ur: 1 mg/dL (ref 0.2–1.0)
pH, UA: 5.5 (ref 5.0–7.5)

## 2024-02-05 MED ORDER — METRONIDAZOLE 500 MG PO TABS: 500.0000 mg | ORAL_TABLET | Freq: Two times a day (BID) | ORAL | 0 refills | Status: AC

## 2024-02-05 NOTE — Progress Notes (Signed)
 Subjective:  Patient ID: Margaret Dixon, female    DOB: 1960-09-03, 63 y.o.   MRN: [id number]  Patient Care Team: Chrystine Crate, FNP as PCP - General (Family Medicine) Loyde Rule, MD as PCP - Cardiology (Cardiology)   Chief Complaint:  Urinary Tract Infection  HPI: Margaret Dixon is a 63 y.o. female presenting on 02/05/2024 for Urinary Tract Infection   States that she has lower back pain, increased frequency of urine, cold chills, sweats. Denies burning with urination, discharge, itching, N/V, hematuria. Reports odor. Reports that she holds her urine a lot on drives and at work. Drinking lots of water. Symptoms started a couple of days ago.    Relevant past medical, surgical, family, and social history reviewed and updated as indicated.  Allergies and medications reviewed and updated. Data reviewed: Chart in Epic.   Past Medical History:  Diagnosis Date   Constipation    Depression    due to Fibromyalgia   Diarrhea    Fibromyalgia    Hormone replacement therapy, postmenopausal    PONV (postoperative nausea and vomiting)     Past Surgical History:  Procedure Laterality Date   ABDOMINAL ADHESION SURGERY  07/28/2015   APPENDECTOMY     BREAST BIOPSY Right 07/29/2023   MM RT BREAST BX W LOC DEV 1ST LESION IMAGE BX SPEC STEREO GUIDE 07/29/2023 [facility]   CHOLECYSTECTOMY     COLONOSCOPY     CORONARY STENT INTERVENTION N/A 12/04/2021   Procedure: CORONARY STENT INTERVENTION;  Surgeon: Swaziland, Peter M, MD;  Location: [facility];  Service: Cardiovascular;  Laterality: N/A;  circumflex   INCISION AND DRAINAGE ABSCESS N/A 11/03/2015   Procedure: EXCISION CHRONIC ABDOMINAL WALL STITCH ABSCESS;  Surgeon: Oza Blumenthal, MD;  Location: [facility];  Service: General;  Laterality: N/A;   LAPAROTOMY N/A 07/28/2015   Procedure: EXPLORATORY LAPAROTOMY;  Surgeon: Oza Blumenthal, MD;  Location: [facility];  Service: General;  Laterality: N/A;    LEFT HEART CATH AND CORONARY ANGIOGRAPHY N/A 12/04/2021   Procedure: LEFT HEART CATH AND CORONARY ANGIOGRAPHY;  Surgeon: Swaziland, Peter M, MD;  Location: [facility]AS[facility] CV [facility];  Service: Cardiovascular;  Laterality: N/A;   LYSIS OF ADHESION N/A 07/28/2015   Procedure: LYSIS OF ADHESION;  Surgeon: Oza Blumenthal, MD;  Location: [facility];  Service: General;  Laterality: N/A;   TONSILLECTOMY     VAGINAL HYSTERECTOMY      Social History   Socioeconomic History   Marital status: Divorced    Spouse name: Not on file   Number of children: 2   Years of education: Not on file   Highest education level: GED or equivalent  Occupational History   Not on file  Tobacco Use   Smoking status: Every Day    Current packs/day: 0.00    Average packs/day: 0.3 packs/day for 8.0 years (2.4 ttl pk-yrs)    Types: Cigarettes    Start date: 07/26/2006    Last attempt to quit: 07/26/2014    Years since quitting: 9.5   Smokeless tobacco: Never  Substance and Sexual Activity   Alcohol use: Yes    Comment: socially   Drug use: No   Sexual activity: Not on file  Other Topics Concern   Not on file  Social History Narrative   Lives alone.  Works at [employer], [employer] and exercise.     Social Drivers of Health   Financial Resource Strain: Low Risk  (12/10/2023)   Received  from [facility]   Overall Financial Resource Strain (CARDIA)    Difficulty of Paying Living Expenses: Not hard at all  Food Insecurity: No Food Insecurity (12/10/2023)   Received from [facility]   Hunger Vital Sign    Worried About Running Out of Food in the Last Year: Never true    Ran Out of Food in the Last Year: Never true  Transportation Needs: No Transportation Needs (12/10/2023)   Received from [facility] - Transportation    Lack of Transportation (Medical): No    Lack of Transportation (Non-Medical): No  Physical Activity: Unknown (12/10/2023)   Received from [facility]   Exercise Vital Sign    Days of Exercise  per Week: 0 days    Minutes of Exercise per Session: Not on file  Stress: No Stress Concern Present (12/10/2023)   Received from [facility] - Occupational Stress Questionnaire    Feeling of Stress : Not at all  Social Connections: Socially Integrated (12/10/2023)   Received [facility]   Social Network    How would you rate your social network (family, work, friends)?: Good participation with social networks  Intimate Partner Violence: Patient Declined (12/10/2023)   Received from [facility]   HITS    Over the last 12 months how often did your partner physically hurt you?: Patient declined    Over the last 12 months how often did your partner insult you or talk down to you?: Patient declined    Over the last 12 months how often did your partner threaten you with physical harm?: Patient declined    Over the last 12 months how often did your partner scream or curse at you?: Patient declined    Outpatient Encounter Medications as of 02/05/2024  Medication Sig   Ascorbic Acid (VITAMIN C GUMMIES PO) Take by mouth.   ASPIRIN  LOW DOSE 81 MG chewable tablet CHEW 1 TABLET BY MOUTH EVERY DAY   cholecalciferol (VITAMIN D3) 25 MCG (1000 UNIT) tablet Take 1,000 Units by mouth daily.   cyanocobalamin  (,VITAMIN B-12,) 1000 MCG/ML injection Inject 1,000 mcg into the muscle every 14 (fourteen) days.   estradiol  (ESTRACE ) 1 MG tablet Take 1 mg by mouth daily.   inclisiran (LEQVIO ) 284 MG/1.5ML SOSY injection Inject into the skin at 0 months, 3 months then every 6 months thereafter. Please apply copay card CARD #:[id number] NUU:[id number]   Multiple Vitamins-Minerals (MULTIVITAMIN GUMMIES ADULT) CHEW Chew by mouth.   nitroGLYCERIN  (NITROSTAT ) 0.4 MG SL tablet Place 1 tablet (0.4 mg total) under the tongue every 5 (five) minutes as needed for chest pain. PLACE 1 TABLET UNDER THE TONGUE EVERY 5 MIN X 3 DOSES AS NEEDED FOR CHEST PAIN.    [DISCONTINUED] tiZANidine  (ZANAFLEX ) 4 MG tablet Take 1 tablet (4 mg total) by mouth every 6 (six) hours as needed for muscle spasms.   Facility-Administered Encounter Medications as of 02/05/2024  Medication   clindamycin  (CLEOCIN ) 900 mg in dextrose  5 % 50 mL IVPB   And   gentamicin  (GARAMYCIN ) 5 mg/kg in dextrose  5 % 50 mL IVPB   cyanocobalamin  (VITAMIN B12) injection 1,000 mcg    Allergies  Allergen Reactions   Penicillins Hives and Rash    Has patient had a PCN reaction causing immediate rash, facial/tongue/throat swelling, SOB or lightheadedness with hypotension: Yes Has patient had a PCN reaction causing severe rash involving mucus membranes or skin necrosis: No Has patient had  a PCN reaction that required hospitalization No Has patient had a PCN reaction occurring within the last 10 years: No If all of the above answers are NO, then may proceed with Cephalosporin use.    Repatha  [Evolocumab ] Shortness Of Breath    Hives, ? angioedema   Shellfish Allergy Hives and Swelling   Ticagrelor  Shortness Of Breath   Cephalexin    Clarithromycin     Review of Systems  Genitourinary:  Positive for frequency and urgency. Negative for dysuria and hematuria.    Objective:  BP 120/70   Pulse 75   Temp 98.3 F (36.8 C)   Ht 5' 4.5 (1.638 m)   Wt 152 lb (68.9 kg)   SpO2 94%   BMI 25.69 kg/m    Wt Readings from Last 3 Encounters:  02/05/24 152 lb (68.9 kg)  12/26/23 155 lb 9.6 oz (70.6 kg)  10/07/23 159 lb 9.6 oz (72.4 kg)    Physical Exam Constitutional:      General: She is awake. She is not in acute distress.    Appearance: Normal appearance. She is well-developed and well-groomed. She is not ill-appearing, toxic-appearing or diaphoretic.  Cardiovascular:     Rate and Rhythm: Normal rate and regular rhythm.     Pulses: Normal pulses.          Radial pulses are 2+ on the right side and 2+ on the left side.       Posterior tibial pulses are 2+ on the right side and  2+ on the left side.     Heart sounds: Normal heart sounds. No murmur heard.    No gallop.  Pulmonary:     Effort: Pulmonary effort is normal. No respiratory distress.     Breath sounds: Normal breath sounds. No stridor. No wheezing, rhonchi or rales.  Abdominal:     General: Abdomen is flat. Bowel sounds are normal.     Palpations: Abdomen is soft.     Tenderness: There is abdominal tenderness in the suprapubic area. There is no right CVA tenderness or left CVA tenderness.     Hernia: No hernia is present.  Musculoskeletal:     Cervical back: Full passive range of motion without pain and neck supple.     Right lower leg: No edema.     Left lower leg: No edema.  Skin:    General: Skin is warm.     Capillary Refill: Capillary refill takes less than 2 seconds.  Neurological:     General: No focal deficit present.     Mental Status: She is alert, oriented to person, place, and time and easily aroused. Mental status is at baseline.     GCS: GCS eye subscore is 4. GCS verbal subscore is 5. GCS motor subscore is 6.     Motor: No weakness.  Psychiatric:        Attention and Perception: Attention and perception normal.        Mood and Affect: Mood and affect normal.        Speech: Speech normal.        Behavior: Behavior normal. Behavior is cooperative.        Thought Content: Thought content normal. Thought content does not include homicidal or suicidal ideation. Thought content does not include homicidal or suicidal plan.        Cognition and Memory: Cognition and memory normal.        Judgment: Judgment normal.     Results for orders placed  or performed in visit on 11/29/23  Lipid panel   Collection Time: 11/29/23 12:00 AM  Result Value Ref Range   Cholesterol, Total 182 100 - 199 mg/dL   Triglycerides 010 (H) 0 - 149 mg/dL   HDL 43 >27 mg/dL   VLDL Cholesterol Cal 35 5 - 40 mg/dL   LDL Chol Calc (NIH) 253 (H) 0 - 99 mg/dL   Chol/HDL Ratio 4.2 0.0 - 4.4 ratio  Comp Met (CMET)    Collection Time: 11/29/23 12:00 AM  Result Value Ref Range   Glucose 137 (H) 70 - 99 mg/dL   BUN 12 8 - 27 mg/dL   Creatinine, Ser 6.64 (H) 0.57 - 1.00 mg/dL   eGFR 56 (L) >40 HK/VQQ/5.95   BUN/Creatinine Ratio 11 (L) 12 - 28   Sodium 143 134 - 144 mmol/L   Potassium 4.2 3.5 - 5.2 mmol/L   Chloride 102 96 - 106 mmol/L   CO2 19 (L) 20 - 29 mmol/L   Calcium  9.4 8.7 - 10.3 mg/dL   Total Protein 7.3 6.0 - 8.5 g/dL   Albumin 4.4 3.9 - 4.9 g/dL   Globulin, Total 2.9 1.5 - 4.5 g/dL   Bilirubin Total 0.4 0.0 - 1.2 mg/dL   Alkaline Phosphatase 78 44 - 121 IU/L   AST 38 0 - 40 IU/L   ALT 29 0 - 32 IU/L  Apolipoprotein B   Collection Time: 11/29/23 12:00 AM  Result Value Ref Range   Apolipoprotein B 109 (H) <90 mg/dL       6/38/7564    3:32 PM 07/12/2023    8:08 AM 05/10/2023    3:48 PM 04/18/2023    2:30 PM 04/10/2023    9:53 AM  Depression screen PHQ 2/9  Decreased Interest 0 0 0 0 0  Down, Depressed, Hopeless 0 0 0 0 0  PHQ - 2 Score 0 0 0 0 0  Altered sleeping  0 0 0   Tired, decreased energy  0 0 0   Change in appetite  0 0 0   Feeling bad or failure about yourself   0 0 0   Trouble concentrating  0 0 0   Moving slowly or fidgety/restless  0 0 0   Suicidal thoughts  0 0 0   PHQ-9 Score  0 0 0   Difficult doing work/chores  Not difficult at all  Not difficult at all        02/05/2024    4:07 PM 07/12/2023    8:08 AM 05/10/2023    3:49 PM 04/18/2023    2:31 PM  GAD 7 : Generalized Anxiety Score  Nervous, Anxious, on Edge 0 0 0 0  Control/stop worrying 0 0 0 0  Worry too much - different things 0 0 0 0  Trouble relaxing 0 0 0 0  Restless 0 0 0 0  Easily annoyed or irritable 0 0 0 0  Afraid - awful might happen 0  0 0  Total GAD 7 Score 0  0 0  Anxiety Difficulty Not difficult at all  Not difficult at all Not difficult at all    Pertinent labs & imaging results that were available during my care of the patient were reviewed by me and considered in my medical  decision making.  Assessment & Plan:  Sherlene was seen today for dysuria.  Diagnoses and all orders for this visit  1. Malodorous urine (Primary) Based on UA, will not start abx at this time.  Based on wet prep, will send in abx as below.  - Urinalysis, Routine w reflex microscopic - Urine Culture - WET PREP FOR TRICH, YEAST, CLUE - Microscopic Examination - metroNIDAZOLE (FLAGYL) 500 MG tablet; Take 1 tablet (500 mg total) by mouth 2 (two) times daily for 7 days.  Dispense: 14 tablet; Refill: 0  2. Urine frequency As above.  - Urinalysis, Routine w reflex microscopic - Urine Culture - WET PREP FOR TRICH, YEAST, CLUE - Microscopic Examination - metroNIDAZOLE (FLAGYL) 500 MG tablet; Take 1 tablet (500 mg total) by mouth 2 (two) times daily for 7 days.  Dispense: 14 tablet; Refill: 0  Continue all other maintenance medications.  Follow up plan: Return if symptoms worsen or fail to improve.   Continue healthy lifestyle choices, including diet (rich in fruits, vegetables, and lean proteins, and low in salt and simple carbohydrates) and exercise (at least 30 minutes of moderate physical activity daily).  Written and verbal instructions provided   The above assessment and management plan was discussed with the patient. The patient verbalized understanding of and has agreed to the management plan. Patient is aware to call the clinic if they develop any new symptoms or if symptoms persist or worsen. Patient is aware when to return to the clinic for a follow-up visit. Patient educated on when it is appropriate to go to the emergency department.   Jacqualyn Mates, DNP-FNP [facility] [address] [place], [place] [address] [phone]

## 2024-02-06 LAB — WET PREP FOR TRICH, YEAST, CLUE
Trichomonas Exam: NEGATIVE
Yeast Exam: NEGATIVE

## 2024-02-07 ENCOUNTER — Ambulatory Visit: Payer: Self-pay | Admitting: Family Medicine

## 2024-02-07 NOTE — Progress Notes (Signed)
 Negative UTI. Continue treatment for BV. Follow up if symptoms continue or worsen.

## 2024-02-24 NOTE — Telephone Encounter (Signed)
 Copied from CRM [id number]. Topic: Clinical - Medication Refill >> Feb 24, 2024  9:16 AM Suzette B wrote: Medication: estradiol  (ESTRACE ) 1 MG tablet  Has the patient contacted their pharmacy? Yes ((Agent: If yes, when and what did the pharmacy advise?)Pharmacy advised they had attempted to contact the providers   This is the patient's preferred pharmacy:  [id number] - [place], [place] - [address] [place] [address] Phone: [phone] Fax: [phone]    Is this the correct pharmacy for this prescription? Yes If no, delete pharmacy and type the correct one.   Has the prescription been filled recently? Yes  Is the patient out of the medication? No  Has the patient been seen for an appointment in the last year OR does the patient have an upcoming appointment? Yes  Can we respond through MyChart? Yes  Agent: Please be advised that Rx refills may take up to 3 business days. We ask that you follow-up with your pharmacy.

## 2024-02-26 ENCOUNTER — Other Ambulatory Visit: Payer: Self-pay | Admitting: *Deleted

## 2024-02-26 NOTE — Telephone Encounter (Addendum)
 TC back to pt. She had refills that got her through till now, only has 2 pills left, these were from her last regular physician has not seen a GYN since she had a hysterectomy. Has been taking Estradiol  for 20 yrs now. This was also prescribed previously by Dr. Wells, family medicine, before he retired. Has upcoming appt in Aug with new provider here.

## 2024-02-26 NOTE — Telephone Encounter (Signed)
 TC from pt needing refill on her Estradiol  Historical med. Last OV for chronic FU 07/12/23 Next OV 04/23/24 Please advise

## 2024-03-09 IMAGING — DX DG CHEST 2V
2 series · 2 of 2 positions shown · non-contrast
Comparison: 01/24/2012

CLINICAL DATA: Chest pain.  Left arm pain for the last week

EXAM:
CHEST - 2 VIEW

[chest pa]
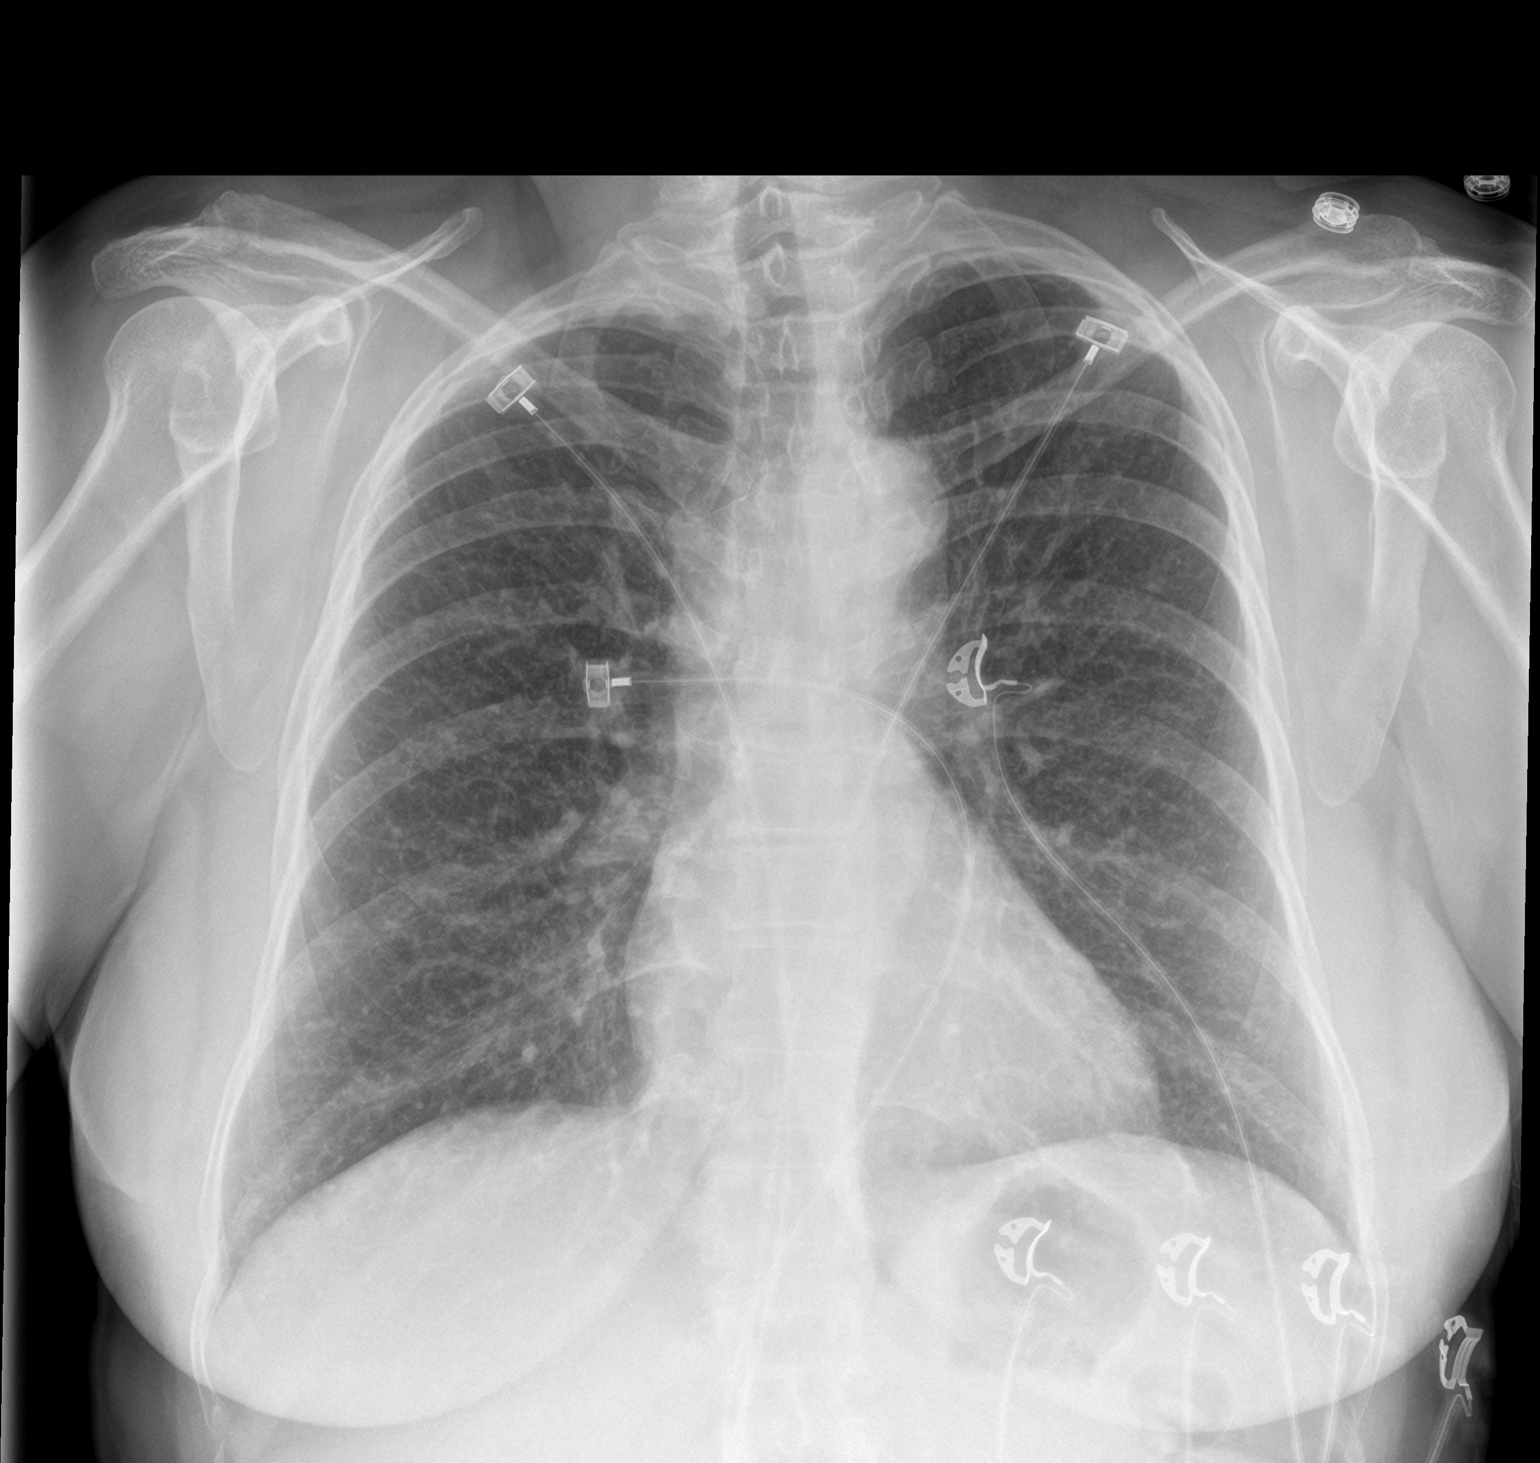

[chest lat]
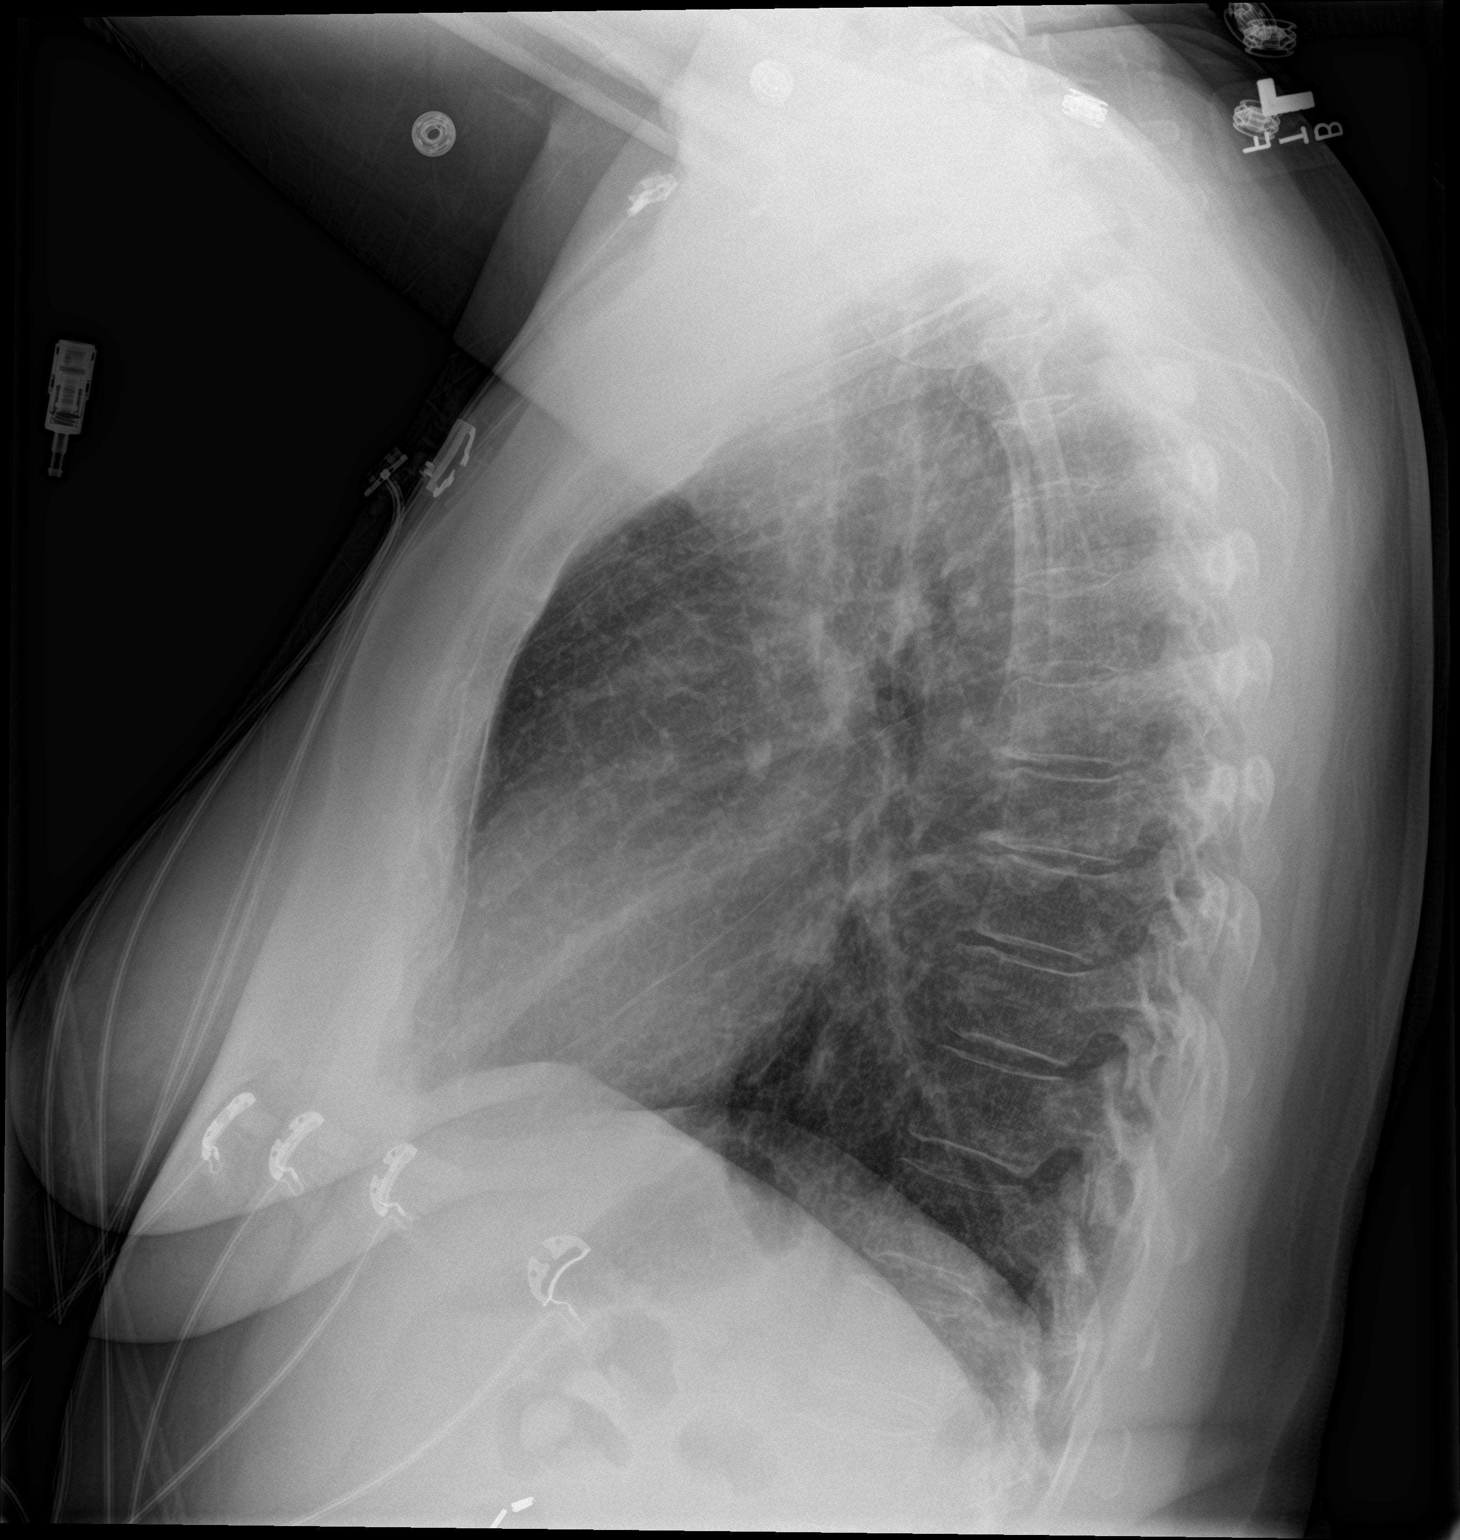

[2 of 2 positions shown; findings below may reference images not displayed]

FINDINGS: The heart size and mediastinal contours are within normal limits.
Both lungs are clear. The visualized skeletal structures are
unremarkable.
IMPRESSION: No active cardiopulmonary disease.

## 2024-03-27 ENCOUNTER — Ambulatory Visit (INDEPENDENT_AMBULATORY_CARE_PROVIDER_SITE_OTHER)

## 2024-03-27 VITALS — BP 170/78 | HR 53 | Temp 97.9°F | Resp 20 | Ht 64.0 in | Wt 150.6 lb

## 2024-03-27 DIAGNOSIS — I2510 Atherosclerotic heart disease of native coronary artery without angina pectoris: Secondary | ICD-10-CM | POA: Diagnosis not present

## 2024-03-27 DIAGNOSIS — E782 Mixed hyperlipidemia: Secondary | ICD-10-CM

## 2024-03-27 MED ORDER — INCLISIRAN SODIUM 284 MG/1.5ML ~~LOC~~ SOSY
284.0000 mg | PREFILLED_SYRINGE | Freq: Once | SUBCUTANEOUS | Status: AC
Start: 2024-03-27 — End: 2024-03-27
  Administered 2024-03-27: 284 mg via SUBCUTANEOUS
  Filled 2024-03-27: qty 1.5

## 2024-03-27 NOTE — Progress Notes (Signed)
 Diagnosis: Hyperlipidemia  Provider:  Mannam, Praveen MD  Procedure: Injection  Leqvio  (inclisiran), Dose: 284 mg, Site: subcutaneous, Number of injections: 1  Injection Site(s): Left lower quad. abdomen  Post Care: left lower quad abdomen injection  Discharge: Condition: Good, Destination: Home . AVS Declined  Performed by:  Maximiano JONELLE Pouch, LPN

## 2024-04-21 ENCOUNTER — Ambulatory Visit (INDEPENDENT_AMBULATORY_CARE_PROVIDER_SITE_OTHER): Admitting: Nurse Practitioner

## 2024-04-21 ENCOUNTER — Ambulatory Visit: Payer: Self-pay | Admitting: Nurse Practitioner

## 2024-04-21 ENCOUNTER — Encounter: Payer: Self-pay | Admitting: Cardiovascular Disease

## 2024-04-21 ENCOUNTER — Emergency Department (HOSPITAL_COMMUNITY)

## 2024-04-21 ENCOUNTER — Emergency Department (HOSPITAL_COMMUNITY)
Admission: EM | Admit: 2024-04-21 | Discharge: 2024-04-21 | Disposition: A | Discharge status: Home / Self Care | Source: Ambulatory Visit

## 2024-04-21 ENCOUNTER — Encounter: Payer: Self-pay | Admitting: Nurse Practitioner

## 2024-04-21 ENCOUNTER — Ambulatory Visit

## 2024-04-21 VITALS — BP 150/82 | HR 58 | Temp 97.6°F | Resp 16 | Ht 64.0 in | Wt 150.0 lb

## 2024-04-21 DIAGNOSIS — R001 Bradycardia, unspecified: Secondary | ICD-10-CM | POA: Diagnosis not present

## 2024-04-21 DIAGNOSIS — I2510 Atherosclerotic heart disease of native coronary artery without angina pectoris: Secondary | ICD-10-CM | POA: Insufficient documentation

## 2024-04-21 DIAGNOSIS — R0789 Other chest pain: Secondary | ICD-10-CM | POA: Diagnosis present

## 2024-04-21 DIAGNOSIS — Z7982 Long term (current) use of aspirin: Secondary | ICD-10-CM | POA: Diagnosis not present

## 2024-04-21 DIAGNOSIS — I509 Heart failure, unspecified: Secondary | ICD-10-CM | POA: Insufficient documentation

## 2024-04-21 DIAGNOSIS — I2089 Other forms of angina pectoris: Secondary | ICD-10-CM | POA: Diagnosis not present

## 2024-04-21 LAB — CBC
HCT: 40.9 % (ref 36.0–46.0)
Hemoglobin: 14 g/dL (ref 12.0–15.0)
MCH: 31.2 pg (ref 26.0–34.0)
MCHC: 34.2 g/dL (ref 30.0–36.0)
MCV: 91.1 fL (ref 80.0–100.0)
Platelets: 263 K/uL (ref 150–400)
RBC: 4.49 MIL/uL (ref 3.87–5.11)
RDW: 12.7 % (ref 11.5–15.5)
WBC: 5.6 K/uL (ref 4.0–10.5)
nRBC: 0 % (ref 0.0–0.2)

## 2024-04-21 LAB — BASIC METABOLIC PANEL WITH GFR
Anion gap: 11 (ref 5–15)
BUN: 14 mg/dL (ref 8–23)
CO2: 23 mmol/L (ref 22–32)
Calcium: 8.8 mg/dL — ABNORMAL LOW (ref 8.9–10.3)
Chloride: 104 mmol/L (ref 98–111)
Creatinine, Ser: 0.84 mg/dL (ref 0.44–1.00)
GFR, Estimated: >60 mL/min (ref >60)
Glucose, Bld: 96 mg/dL (ref 70–99)
Potassium: 3.7 mmol/L (ref 3.5–5.1)
Sodium: 138 mmol/L (ref 135–145)

## 2024-04-21 LAB — TROPONIN I (HIGH SENSITIVITY)
Troponin I (High Sensitivity): 2 ng/L (ref <18)
Troponin I (High Sensitivity): 2 ng/L (ref <18)

## 2024-04-21 LAB — D-DIMER, QUANTITATIVE: D-Dimer, Quant: 0.55 ug{FEU}/mL — ABNORMAL HIGH (ref 0.00–0.50)

## 2024-04-21 NOTE — ED Provider Notes (Signed)
 [place] EM[facility]GENCY DEPARTMENT AT [facility] Provider Note   CSN: [id number] Arrival date & time: 04/21/24  1031     Patient presents with: Chest Pain   Margaret Dixon is a 63 y.o. female.   Chest Pain Associated symptoms: shortness of breath    Patient is a 63 year old female present to ED today for acute onset left-sided chest pain worse with exertion that is accompanied with shortness of breath.  Noted to have 1 episode of dizziness which she reported as bilateral blurry vision and lightheadedness that she felt when ambulating.  Previous medical history of CAD, CHF, fibromyalgia, HRT taking Estrace .  Underwent PCI 2 years ago.  Seen by PCP earlier today and was told to come into the ED for further evaluation with patient's current symptoms.  Denies recent trips, previous clot history.  Denies fever, headache, unilateral weakness, abdominal pain, vomiting, dysuria, hematuria, hematochezia, melena, lower leg swelling.  Prior to Admission medications   Medication Sig Start Date End Date Taking? Authorizing Provider  Ascorbic Acid (VITAMIN C GUMMIES PO) Take by mouth.    [provider]  ASPIRIN  LOW DOSE 81 MG chewable tablet CHEW 1 TABLET BY MOUTH EVERY DAY 10/17/23   Nishan, Peter C, MD  cholecalciferol (VITAMIN D3) 25 MCG (1000 UNIT) tablet Take 1,000 Units by mouth daily.    [provider]  cyanocobalamin  (,VITAMIN B-12,) 1000 MCG/ML injection Inject 1,000 mcg into the muscle every 14 (fourteen) days.    [provider]  estradiol  (ESTRACE ) 1 MG tablet Take 1 mg by mouth daily.    [provider]  inclisiran (LEQVIO ) 284 MG/1.5ML SOSY injection Inject into the skin at 0 months, 3 months then every 6 months thereafter. Please apply copay card CARD #:[id number] APW:[id number] HMNLE:[id number] ERW:NYRE 12/18/23   Nishan, Peter C, MD  Multiple Vitamins-Minerals (MULTIVITAMIN GUMMIES ADULT) CHEW Chew by mouth.    [provider]  nitroGLYCERIN  (NITROSTAT ) 0.4 MG SL tablet Place 1 tablet (0.4 mg total) under the tongue every 5 (five) minutes as needed for chest pain. PLACE 1 TABLET UNDER THE TONGUE EVERY 5 MIN X 3 DOSES AS NEEDED FOR CHEST PAIN. 04/03/23   Milian, Marry Lenis, FNP    Allergies: Penicillins, Repatha  [evolocumab ], Shellfish allergy, Ticagrelor , Cephalexin, and Clarithromycin    Review of Systems  Respiratory:  Positive for shortness of breath.   Cardiovascular:  Positive for chest pain.  All other systems reviewed and are negative.   Updated Vital Signs BP (!) 159/72   Pulse 64   Temp 98 F (36.7 C) (Oral)   Resp 16   Ht 5' 4 (1.626 m)   Wt 68 kg   SpO2 98%   BMI 25.75 kg/m   Physical Exam Vitals and nursing note reviewed.  Constitutional:      General: She is not in acute distress.    Appearance: Normal appearance. She is not ill-appearing or diaphoretic.  HENT:     Head: Normocephalic and atraumatic.  Eyes:     General: No scleral icterus.       Right eye: No discharge.        Left eye: No discharge.     Extraocular Movements: Extraocular movements intact.     Conjunctiva/sclera: Conjunctivae normal.  Neck:     Vascular: No JVD.     Trachea: No tracheal deviation.  Cardiovascular:     Rate and Rhythm: Regular rhythm. Bradycardia present.     Pulses: Normal pulses.  Heart sounds: Normal heart sounds. No murmur heard.    No friction rub. No gallop.  Pulmonary:     Effort: Pulmonary effort is normal. No respiratory distress.     Breath sounds: No stridor. No decreased breath sounds, wheezing, rhonchi or rales.  Chest:     Chest wall: Tenderness (Noted to have some mild left-sided chest wall tenderness to palpation.) present.  Abdominal:     General: Abdomen is flat. There is no distension.     Palpations: Abdomen is soft.     Tenderness: There is no abdominal tenderness. There is no right CVA tenderness, left CVA tenderness, guarding or rebound.  Musculoskeletal:         General: No swelling, deformity or signs of injury.     Cervical back: Normal range of motion. No rigidity or tenderness.     Right lower leg: No edema.     Left lower leg: No edema.  Skin:    General: Skin is warm and dry.     Findings: No bruising, erythema, lesion or rash.     Nails: There is no clubbing.  Neurological:     General: No focal deficit present.     Mental Status: She is alert and oriented to person, place, and time. Mental status is at baseline.     Cranial Nerves: No cranial nerve deficit.     Sensory: No sensory deficit.     Motor: No weakness.     Comments: No facial asymmetry, no ataxia, no apraxia, no aphasia, no arm drift, normal coordination with finger-to-nose, normal sensation to both upper and lower extremities bilaterally, normal grip strength bilaterally, normal strength to both flexion and extension to both upper lower extremities 5+ bilaterally, no visual field deficits, no nystagmus.   Psychiatric:        Mood and Affect: Mood normal. Mood is not anxious.     (all labs ordered are listed, but only abnormal results are displayed) Labs Reviewed  BASIC METABOLIC PANEL WITH GFR - Abnormal; Notable for the following components:      Result Value   Calcium  8.8 (*)    All other components within normal limits  D-DIMER, QUANTITATIVE - Abnormal; Notable for the following components:   D-Dimer, Quant 0.55 (*)    All other components within normal limits  CBC  TROPONIN I (HIGH SENSITIVITY)  TROPONIN I (HIGH SENSITIVITY)    EKG: EKG Interpretation Date/Time:  Tuesday April 21 2024 10:40:01 EDT Ventricular Rate:  55 PR Interval:  132 QRS Duration:  84 QT Interval:  449 QTC Calculation: 430 R Axis:   69  Text Interpretation: Sinus rhythm Confirmed by Ula Barter [phone]) on 04/21/2024 10:59:58 AM  Radiology: DG Chest 2 View Result Date: 04/21/2024 CLINICAL DATA:  Chest pain. EXAM: DG CHEST 2V COMPARISON:  12/02/2021 FINDINGS: Cardiomediastinal  silhouette and pulmonary vasculature are within normal limits. Lungs are clear. IMPRESSION: No acute cardiopulmonary process. Electronically Signed   By: Aliene Lloyd M.D.   On: 04/21/2024 11:58    Procedures   Medications Ordered in the ED - No data to display                              Medical Decision Making Amount and/or Complexity of Data Reviewed Labs: ordered. Radiology: ordered.   This patient is a 63 year old female who presents to the ED for concern of left-sided chest pain, seen by PCP today and was concerned  for possible ACS and sent to the emergency department for evaluation, with her previous history of needing PCI for severe CAD.  They are to be on HRT.  On physical exam, patient is in no acute distress, afebrile, alert and orient x 4, speaking in full sentences, nontachypneic, nontachycardic.  Notably does have some left-sided chest wall tenderness to palpation, with exam being otherwise unremarkable.  Low suspicion for ACS however with patient's chest pain and HRT, D-dimer was obtained as well and were negative.  D-dimer was negative when adjusted for age.  Lab work and imaging were unremarkable for any acute abnormalities.  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  Will have her continue to manage symptoms at home and follow-up with PCP for any persistent symptoms, return to the ED for any new or worsening symptoms.  Patient vital signs have remained stable throughout the course of patient's time in the ED. Low suspicion for any other emergent pathology at this time. I believe this patient is safe to be discharged. Provided strict return to ER precautions. Patient expressed agreement and understanding of plan. All questions were answered.  Differential diagnoses prior to evaluation: The emergent differential  diagnosis includes, but is not limited to, ACS, AAS, Pulmonary Embolism, Tension Pneumothorax, Esophageal Rupture, Cardiac Tamponade, Pericarditis, Myocarditis, Pneumothorax, Pneumonia, Aortic Stenosis, CHF Exacerbation, GERD,  Esophageal Spasm,  Mallory-Weiss, Costochondritis, Musculoskeletal Chest Wall Pain, Anxiety / Panic Attack. This is not an exhaustive differential.   Past Medical History / Co-morbidities / Social History: Fibromyalgia, HRT, CHF, arthritis.   Status post cholecystectomy  Additional history: Chart reviewed. Pertinent results include:   Seen today by PCP for angina, with patient's current symptoms, was recommended to go to the emergency department for further evaluation.  Referred to cardiology.  Previous echo done on 09/2022, showing EF of 60 to 65% with normal LV function.  Last heart cath done on 11/2021 noting single-vessel obstruction to mid Lcx with successful PCI.  Lab Tests/Imaging studies: I personally interpreted labs/imaging and the pertinent results include:   CBC unremarkable BMP unremarkable Troponin and delta troponin negative D-dimer was positive however negative when adjusted for age Chest x-ray unremarkable I agree with the radiologist interpretation.  Cardiac monitoring: EKG obtained and interpreted by myself and attending physician which shows: Sinus rhythm  EKG Interpretation Date/Time:  Tuesday April 21 2024 10:40:01 EDT Ventricular Rate:  55 PR Interval:  132 QRS Duration:  84 QT Interval:  449 QTC Calculation: 430 R Axis:   69  Text Interpretation: Sinus rhythm Confirmed by Ula Barter [phone]) on 04/21/2024 10:59:58 AM           I have reviewed the patients home medicines and have made adjustments as needed.  Critical Interventions: None  Social Determinants of Health: Has good follow-up with PCP  Disposition: After consideration of the diagnostic results and the patients response to treatment, I feel that the patient would  benefit from discharge and treatment as above.   emergency department workup does not suggest an emergent condition requiring admission or immediate intervention beyond what has been performed at this time. The plan is: Follow-up with PCP, symptomatic management at home, return for new or worsening symptoms. The patient is safe for discharge and has been instructed to return immediately for worsening symptoms, change in symptoms or any other concerns.  Final diagnoses:  Chest wall pain    ED Discharge Orders     None          Beola Terrall GORMAN DEVONNA 04/21/24 1409    Ula Prentice SAUNDERS, MD 04/21/24 1415

## 2024-04-21 NOTE — Progress Notes (Signed)
 Subjective:    Patient ID: Margaret Dixon, female    DOB: 10/17/1960, 63 y.o.   MRN: [id number]   Chief Complaint: chest pain Patient was at work this morning at [employer] when she developed left sided chest pain. Describes as pressure. She had MI with stent placement 2 years ago. Had neck and arm pain at that time. Current pain does not radiate.  Chest Pain  This is a new problem. The current episode started today. The problem occurs constantly. The problem has been waxing and waning. The pain is present in the substernal region. The pain is at a severity of 6/10. The pain is moderate. The quality of the pain is described as pressure. The pain does not radiate. Pertinent negatives include no back pain, claudication, fever, nausea or shortness of breath.  Her past medical history is significant for hyperlipidemia.  Pertinent negatives for past medical history include no diabetes and no hypertension. Past medical history comments: stent placement 2 years ago Prior diagnostic workup includes echocardiogram.   Has taken 3 baby aspirin  today  Patient Active Problem List   Diagnosis Date Noted   Prediabetes 04/03/2023   Vitamin B12 deficiency 04/03/2023   Former tobacco use 04/03/2023   Eustachian tube dysfunction, bilateral 04/03/2023   Menopause 12/04/2021   Thyroid  dysfunction 12/04/2021   ACS (acute coronary syndrome) (HCC) 12/02/2021   NSTEMI (non-ST elevated myocardial infarction) (HCC) 12/02/2021   Vitamin D  deficiency 09/26/2015   S/P exploratory laparotomy 07/28/2015   Anemia due to vitamin B12 deficiency 05/23/2015   Fibromyalgia 04/29/2014   CAD (coronary artery disease) 02/25/2012   Hyperlipidemia 02/25/2012       Review of Systems  Constitutional:  Negative for fever.  Respiratory:  Positive for chest tightness. Negative for shortness of breath.   Cardiovascular:  Positive for chest pain. Negative for claudication.  Gastrointestinal:  Negative for nausea.   Musculoskeletal:  Negative for back pain.       Objective:   Physical Exam Constitutional:      Appearance: Normal appearance.  Cardiovascular:     Rate and Rhythm: Normal rate and regular rhythm.     Heart sounds: Normal heart sounds.  Pulmonary:     Effort: Pulmonary effort is normal.     Breath sounds: Normal breath sounds.  Skin:    General: Skin is warm.  Neurological:     General: No focal deficit present.     Mental Status: She is alert and oriented to person, place, and time.  Psychiatric:        Mood and Affect: Mood normal.    EKG- NSR-Mary-Margaret Gladis, FNP   BP (!) 164/78   Pulse (!) 58   Temp 97.6 F (36.4 C) (Oral)   Resp 16   Ht 5' 4 (1.626 m)   Wt 150 lb (68 kg)   SpO2 99%   BMI 25.75 kg/m       Elida JONETTA Forts in today with chief complaint of Chest Pain   1. Other forms of angina pectoris (HCC) (Primary) To [facility] for troponin levels. - EKG 12-Lead - Ambulatory referral to Cardiology    The above assessment and management plan was discussed with the patient. The patient verbalized understanding of and has agreed to the management plan. Patient is aware to call the clinic if symptoms persist or worsen. Patient is aware when to return to the clinic for a follow-up visit. Patient educated on when it is appropriate to go to  the emergency department.   Mary-Margaret Gladis, FNP

## 2024-04-21 NOTE — Discharge Instructions (Signed)
 You were seen today for left-sided chest pain.  Your lab work and imaging today were reassuring that have low suspicion for any emergent cause of your symptoms today.  Would recommend that you continue to take Tylenol  and ibuprofen for the left-sided chest wall tenderness.  Take Tylenol  (acetominophen)  650mg  every 4-6 hours, as needed for pain or fever. Do not take more than 4,000 mg in a 24-hour period. As this may cause liver damage. While this is rare, if you begin to develop yellowing of the skin or eyes, stop taking and return to ER immediately. Take Ibuprofen 400mg  every 4-6 hours for pain or fever, not exceeding 3,200 mg per day as more than 3,200mg  can cause Stomach irritation, dizziness, kidney issues with long-term use.  Please ensure to follow-up with PCP for any persistent symptoms and return to the ED for any new or worsening symptoms.

## 2024-04-21 NOTE — ED Notes (Signed)
 Pt ambulated to restroom.

## 2024-04-21 NOTE — ED Triage Notes (Signed)
 Patient arrives by [facility] from [place] [facility] with complaint of chest pain, in route over patient was given nitroglycerin  tab 0.4 x1 at 10am, 4mg  of morphine  at 1013, and Zofran  4mg  at 1025. Patient reports pain is 3/10 mid chest at this time.

## 2024-04-23 ENCOUNTER — Ambulatory Visit (INDEPENDENT_AMBULATORY_CARE_PROVIDER_SITE_OTHER)

## 2024-04-23 ENCOUNTER — Encounter: Payer: Self-pay | Admitting: Family

## 2024-04-23 ENCOUNTER — Ambulatory Visit: Admitting: Family

## 2024-04-23 VITALS — BP 136/82 | HR 54 | Temp 97.8°F | Ht 64.0 in | Wt 149.6 lb

## 2024-04-23 DIAGNOSIS — Z780 Asymptomatic menopausal state: Secondary | ICD-10-CM

## 2024-04-23 DIAGNOSIS — Z0001 Encounter for general adult medical examination with abnormal findings: Secondary | ICD-10-CM | POA: Diagnosis not present

## 2024-04-23 DIAGNOSIS — J019 Acute sinusitis, unspecified: Secondary | ICD-10-CM

## 2024-04-23 DIAGNOSIS — Z0000 Encounter for general adult medical examination without abnormal findings: Secondary | ICD-10-CM

## 2024-04-23 DIAGNOSIS — R7303 Prediabetes: Secondary | ICD-10-CM

## 2024-04-23 DIAGNOSIS — I252 Old myocardial infarction: Secondary | ICD-10-CM

## 2024-04-23 DIAGNOSIS — I2510 Atherosclerotic heart disease of native coronary artery without angina pectoris: Secondary | ICD-10-CM

## 2024-04-23 DIAGNOSIS — D519 Vitamin B12 deficiency anemia, unspecified: Secondary | ICD-10-CM

## 2024-04-23 DIAGNOSIS — E559 Vitamin D deficiency, unspecified: Secondary | ICD-10-CM

## 2024-04-23 DIAGNOSIS — E782 Mixed hyperlipidemia: Secondary | ICD-10-CM | POA: Diagnosis not present

## 2024-04-23 LAB — BAYER DCA HB A1C WAIVED: HB A1C (BAYER DCA - WAIVED): 5.7 % — ABNORMAL HIGH (ref 4.8–5.6)

## 2024-04-23 LAB — LIPID PANEL

## 2024-04-23 MED ORDER — NITROGLYCERIN 0.4 MG SL SUBL: 0.4000 mg | SUBLINGUAL_TABLET | SUBLINGUAL | 1 refills | Status: AC | PRN

## 2024-04-23 MED ORDER — DOXYCYCLINE HYCLATE 100 MG PO TABS: 100.0000 mg | ORAL_TABLET | Freq: Two times a day (BID) | ORAL | 0 refills | Status: DC

## 2024-04-23 NOTE — Patient Instructions (Signed)

## 2024-04-23 NOTE — Progress Notes (Signed)
 Subjective:    Patient ID: Margaret Dixon, female    DOB: [birth year], 63 y.o.   MRN: [id number]  Chief Complaint  Patient presents with   Annual Exam    Patient c/o sinus issues since Sunday. Declines all vaccines today because she feels sick   PT presents to the office today for CPE.  She is followed by Cardiologists annually for hx NSTEM in 2023 and CAD.  Sinus Problem This is a new problem. The current episode started 1 to 4 weeks ago. The problem has been gradually worsening since onset. There has been no fever. Her pain is at a severity of 7/10. The pain is moderate. Associated symptoms include congestion, ear pain, headaches, a hoarse voice, shortness of breath and sinus pressure. Pertinent negatives include no coughing. Treatments tried: zyrtec and tylenol .  Hyperlipidemia This is a chronic problem. The current episode started more than 1 year ago. The problem is uncontrolled. Exacerbating diseases include obesity. Associated symptoms include shortness of breath. Treatments tried: leqvio . The current treatment provides moderate improvement of lipids. Risk factors for coronary artery disease include dyslipidemia, a sedentary lifestyle and post-menopausal.  Diabetes Diabetes type: hx of prediabetes. Hypoglycemia symptoms include headaches. Pertinent negatives for hypoglycemia include no confusion. Associated symptoms include blurred vision. Pertinent negatives for diabetes include no foot paresthesias. Risk factors for coronary artery disease include dyslipidemia, sedentary lifestyle and post-menopausal. (Does not check )  Anemia Presents for follow-up visit. Symptoms include malaise/fatigue. There has been no confusion or fever.      Review of Systems  Constitutional:  Positive for malaise/fatigue. Negative for fever.  HENT:  Positive for congestion, ear pain, hoarse voice and sinus pressure.   Eyes:  Positive for blurred vision.  Respiratory:  Positive for shortness of  breath. Negative for cough.   Neurological:  Positive for headaches.  Psychiatric/Behavioral:  Negative for confusion.   All other systems reviewed and are negative.   Social History   Socioeconomic History   Marital status: Divorced    Spouse name: Not on file   Number of children: 2   Years of education: Not on file   Highest education level: GED or equivalent  Occupational History   Not on file  Tobacco Use   Smoking status: Every Day    Current packs/day: 0.00    Average packs/day: 0.3 packs/day for 8.0 years (2.4 ttl pk-yrs)    Types: Cigarettes    Start date: 07/26/2006    Last attempt to quit: 07/26/2014    Years since quitting: 9.7   Smokeless tobacco: Never  Substance and Sexual Activity   Alcohol use: Yes    Comment: socially   Drug use: No   Sexual activity: Not on file  Other Topics Concern   Not on file  Social History Narrative   Lives alone.  Works at [employer], sport and exercise.     Social Drivers of Corporate investment banker Strain: Low Risk  (04/19/2024)   Overall Financial Resource Strain (CARDIA)    Difficulty of Paying Living Expenses: Not very hard  Food Insecurity: No Food Insecurity (04/19/2024)   Hunger Vital Sign    Worried About Running Out of Food in the Last Year: Never true    Ran Out of Food in the Last Year: Never true  Transportation Needs: No Transportation Needs (04/19/2024)   PRAPARE - Administrator, Civil Service (Medical): No    Lack of Transportation (Non-Medical): No  Physical Activity: Insufficiently Active (  04/19/2024)   Exercise Vital Sign    Days of Exercise per Week: 3 days    Minutes of Exercise per Session: 30 min  Stress: No Stress Concern Present (04/19/2024)   [employer] of Occupational Health - Occupational Stress Questionnaire    Feeling of Stress: Not at all  Social Connections: Moderately Isolated (04/19/2024)   Social Connection and Isolation Panel    Frequency of Communication with Friends and  Family: Once a week    Frequency of Social Gatherings with Friends and Family: Once a week    Attends Religious Services: More than 4 times per year    Active Member of [employer] or Organizations: Yes    Attends Engineer, structural: More than 4 times per year    Marital Status: Divorced   Family History  Problem Relation Age of Onset   Cancer Mother    Coronary artery disease Father 36   Diabetes Father    Colon polyps Father    Emphysema Father    Cancer Father    Heart disease Father    Coronary artery disease Paternal Grandfather        Died of CAD at an early age.   Breast cancer Neg Hx         Objective:   Physical Exam Vitals reviewed.  Constitutional:      General: She is not in acute distress.    Appearance: She is well-developed.  HENT:     Head: Normocephalic and atraumatic.     Right Ear: Tympanic membrane normal.     Left Ear: Tympanic membrane normal.     Nose:     Right Sinus: Frontal sinus tenderness present.     Left Sinus: Frontal sinus tenderness present.  Eyes:     Pupils: Pupils are equal, round, and reactive to light.  Neck:     Thyroid : No thyromegaly.  Cardiovascular:     Rate and Rhythm: Normal rate and regular rhythm.     Heart sounds: Normal heart sounds. No murmur heard. Pulmonary:     Effort: Pulmonary effort is normal. No respiratory distress.     Breath sounds: Normal breath sounds. No wheezing.  Abdominal:     General: Bowel sounds are normal. There is no distension.     Palpations: Abdomen is soft.     Tenderness: There is no abdominal tenderness.  Musculoskeletal:        General: No tenderness. Normal range of motion.     Cervical back: Normal range of motion and neck supple.  Skin:    General: Skin is warm and dry.  Neurological:     Mental Status: She is alert and oriented to person, place, and time.     Cranial Nerves: No cranial nerve deficit.     Deep Tendon Reflexes: Reflexes are normal and symmetric.   Psychiatric:        Behavior: Behavior normal.        Thought Content: Thought content normal.        Judgment: Judgment normal.       BP 136/82   Pulse (!) 54   Temp 97.8 F (36.6 C)   Ht 5' 4 (1.626 m)   Wt 149 lb 9.6 oz (67.9 kg)   SpO2 98%   BMI 25.68 kg/m      Assessment & Plan:  CHARLANE WESTRY comes in today with chief complaint of Annual Exam (Patient c/o sinus issues since Sunday. Declines all vaccines today because  she feels sick)   Diagnosis and orders addressed:  1. History of non-ST elevation myocardial infarction (NSTEMI) - nitroGLYCERIN  (NITROSTAT ) 0.4 MG SL tablet; Place 1 tablet (0.4 mg total) under the tongue every 5 (five) minutes as needed for chest pain. PLACE 1 TABLET UNDER THE TONGUE EVERY 5 MIN X 3 DOSES AS NEEDED FOR CHEST PAIN.  Dispense: 25 tablet; Refill: 1 - CMP14+EGFR - CBC with Differential/Platelet  2. Annual physical exam (Primary) - CMP14+EGFR - CBC with Differential/Platelet - Lipid panel - TSH - Bayer DCA Hb A1c Waived - Vitamin B12  3. Coronary artery disease involving native coronary artery of native heart without angina pectoris - CMP14+EGFR - CBC with Differential/Platelet - Lipid panel  4. Mixed hyperlipidemia - CMP14+EGFR - CBC with Differential/Platelet - Lipid panel  5. Vitamin D  deficiency  - CMP14+EGFR - CBC with Differential/Platelet  6. Anemia due to vitamin B12 deficiency, unspecified B12 deficiency type - CMP14+EGFR - CBC with Differential/Platelet - Vitamin B12  7. Prediabetes - CMP14+EGFR - CBC with Differential/Platelet - Bayer DCA Hb A1c Waived  8. Post-menopause - CMP14+EGFR - CBC with Differential/Platelet - DG WRFM DEXA  9. Acute non-recurrent sinusitis, unspecified location - Take meds as prescribed - Use a cool mist humidifier  -Use saline nose sprays frequently -Force fluids -For any cough or congestion  Use plain Mucinex- regular strength or max strength is fine -For fever or  aces or pains- take tylenol  or ibuprofen. -Throat lozenges if help -Follow up if symptoms worsen or do not improve  - doxycycline  (VIBRA -TABS) 100 MG tablet; Take 1 tablet (100 mg total) by mouth 2 (two) times daily.  Dispense: 20 tablet; Refill: 0   Labs pending Continue current medications  Keep follow up with specialists  Health Maintenance reviewed Diet and exercise encouraged  Return in about 1 year (around 04/23/2025), or if symptoms worsen or fail to improve.    Bari Learn, FNP

## 2024-04-24 ENCOUNTER — Other Ambulatory Visit: Payer: Self-pay | Admitting: Family

## 2024-04-24 ENCOUNTER — Ambulatory Visit: Payer: Self-pay | Admitting: Family

## 2024-04-24 DIAGNOSIS — R7303 Prediabetes: Secondary | ICD-10-CM

## 2024-04-24 LAB — LIPID PANEL
Cholesterol, Total: 109 mg/dL (ref 100–199)
HDL: 46 mg/dL (ref >39)
LDL CALC COMMENT:: 2.4 ratio (ref 0.0–4.4)
LDL Chol Calc (NIH): 38 mg/dL (ref 0–99)
Triglycerides: 148 mg/dL (ref 0–149)
VLDL Cholesterol Cal: 25 mg/dL (ref 5–40)

## 2024-04-24 LAB — TSH: TSH: 5.79 u[IU]/mL — AB (ref 0.450–4.500)

## 2024-04-24 LAB — CBC WITH DIFFERENTIAL/PLATELET
Basophils Absolute: 0.1 x10E3/uL (ref 0.0–0.2)
Basos: 1 %
EOS (ABSOLUTE): 0.4 x10E3/uL (ref 0.0–0.4)
Eos: 6 %
Hematocrit: 44.2 % (ref 34.0–46.6)
Hemoglobin: 14.5 g/dL (ref 11.1–15.9)
Immature Grans (Abs): 0 x10E3/uL (ref 0.0–0.1)
Immature Granulocytes: 0 %
Lymphocytes Absolute: 2.8 x10E3/uL (ref 0.7–3.1)
Lymphs: 50 %
MCH: 30.8 pg (ref 26.6–33.0)
MCHC: 32.8 g/dL (ref 31.5–35.7)
MCV: 94 fL (ref 79–97)
Monocytes Absolute: 0.6 x10E3/uL (ref 0.1–0.9)
Monocytes: 11 %
Neutrophils Absolute: 1.9 x10E3/uL (ref 1.4–7.0)
Neutrophils: 32 %
Platelets: 279 x10E3/uL (ref 150–450)
RBC: 4.71 x10E6/uL (ref 3.77–5.28)
RDW: 12 % (ref 11.7–15.4)
WBC: 5.8 x10E3/uL (ref 3.4–10.8)

## 2024-04-24 LAB — CMP14+EGFR
ALT: 24 IU/L (ref 0–32)
AST: 35 IU/L (ref 0–40)
Albumin: 4.5 g/dL (ref 3.9–4.9)
Alkaline Phosphatase: 84 IU/L (ref 44–121)
BUN/Creatinine Ratio: 14 (ref 12–28)
BUN: 14 mg/dL (ref 8–27)
Bilirubin Total: 0.5 mg/dL (ref 0.0–1.2)
CO2: 23 mmol/L (ref 20–29)
Calcium: 9.8 mg/dL (ref 8.7–10.3)
Chloride: 102 mmol/L (ref 96–106)
Creatinine, Ser: 1.02 mg/dL — AB (ref 0.57–1.00)
Globulin, Total: 2.5 g/dL (ref 1.5–4.5)
Glucose: 90 mg/dL (ref 70–99)
Potassium: 4.9 mmol/L (ref 3.5–5.2)
Sodium: 141 mmol/L (ref 134–144)
Total Protein: 7 g/dL (ref 6.0–8.5)
eGFR: 61 mL/min/1.73 (ref >59)

## 2024-04-24 LAB — VITAMIN B12: Vitamin B-12: 770 pg/mL (ref 232–1245)

## 2024-04-28 NOTE — Telephone Encounter (Signed)
 Left detailed message making patient aware of normal bone density results and advised for her to call back if she had any questions.

## 2024-04-29 ENCOUNTER — Telehealth: Payer: Self-pay

## 2024-04-29 NOTE — Progress Notes (Signed)
 Care Guide Pharmacy Note  04/29/2024 Name: Margaret Dixon MRN: [id number] DOB: [birth year]  Referred By: Lavell Bari LABOR, FNP Reason for referral: Complex Care Management (Outreach to schedule with Pharm d )   Margaret Dixon is a 63 y.o. year old female who is a primary care patient of Lavell Bari LABOR, FNP.  Margaret Dixon was referred to the pharmacist for assistance related to: DMII  Successful contact was made with the patient to discuss pharmacy services including being ready for the pharmacist to call at least 5 minutes before the scheduled appointment time and to have medication bottles and any blood pressure readings ready for review. The patient agreed to meet with the pharmacist via telephone visit on (date/time).05/12/2024  Jeoffrey Buffalo , RMA     [place]  [facility], [facility] Guide  Direct Dial: [phone]  Website: [email or web address]

## 2024-05-11 NOTE — Progress Notes (Unsigned)
 05/12/2024 Name: Margaret Dixon MRN: [id number] DOB: Sep 11, 1960  Chief Complaint  Patient presents with   Prediabetes   Hyperlipidemia    Margaret Dixon is a 63 y.o. year old female who presented for a telephone visit.   They were referred to the pharmacist by their PCP for assistance in managing pre-diabetes.    Subjective: Margaret Dixon presents for her telephone visit today for education regarding her recent pre-diabetes diagnosis. She states that her father was a diabetic, so she knew to reduce her sugar and carbohydrate intake. 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 She has lost 10 pounds since then and has been able to maintain a healthy diet.  She was concerned with not fasting prior to her blood work. Ensured her that an A1c was an average of her blood sugar over the past 3 months and that eating prior to the lab draw would not affect it.    Care Team: Primary Care Provider: Lavell Bari LABOR, FNP ; Next Scheduled Visit: 06/25/24  Medication Access/Adherence  Current Pharmacy:  [facility] [phone] - [place], [place] - [address] [place] [address] Phone: [phone] Fax: [phone]  [facility] - [place], [place] - [address] [facility] [address] [place] [address] Phone: [phone] Fax: [phone]   Pre-Diabetes: Current medications: none Medications tried in the past: none  A1c of 5.7% on 04/23/24  Current meal patterns:  - Breakfast: fruit (banana) and snack bar - Lunch: [place] burger, salad (oil & vinegar) - Supper: vegetables (broccoli, squash, zucchini) and meat - Snacks: sweets (trying to limit)  - Drinks: water, diet green tea, coffee (moving sugar to honey)  Current physical activity: increase as able  Current medication access support: [employer]   Hyperlipidemia/ASCVD Risk Reduction Current lipid lowering medications: inclisiran (Leqvio )   Medications tried in the past:  Repatha  (SOB?)  Atorvastatin , pravastatin, rosuvastatin (h/o myalgias, diarrhea)  Risk Factors: CAD, NSTEMI, HLD, smoking (quit 2 years ago)  LDL of 38 on 04/23/24, decreased from 104 on 11/29/23   Objective: Lab Results  Component Value Date   HGBA1C 5.7 (H) 04/23/2024   Lab Results  Component Value Date   CREATININE 1.02 (H) 04/23/2024   BUN 14 04/23/2024   NA 141 04/23/2024   K 4.9 04/23/2024   CL 102 04/23/2024   CO2 23 04/23/2024   Lab Results  Component Value Date   CHOL 109 04/23/2024   HDL 46 04/23/2024   LDLCALC 38 04/23/2024   TRIG 148 04/23/2024   CHOLHDL 2.4 04/23/2024   Medications Reviewed Today     Reviewed by Bernette Falling, [facility] (Pharmacist) on 05/12/24 at 1524  Med List Status: <None>   Medication Order Taking? Sig Documenting Provider Last Dose Status Informant  Ascorbic Acid (VITAMIN C GUMMIES PO) [id number]  Take by mouth. [provider]  Active Self, Pharmacy Records  ASPIRIN  LOW DOSE 81 MG chewable tablet [id number]  CHEW 1 TABLET BY MOUTH EVERY DAY Nishan, Peter C, MD  Active   cholecalciferol (VITAMIN D3) 25 MCG (1000 UNIT) tablet [id number]  Take 1,000 Units by mouth daily. [provider]  Active   cyanocobalamin  (,VITAMIN B-12,) 1000 MCG/ML injection [id number]  Inject 1,000 mcg into the muscle every 14 (fourteen) days. [provider]  Active Self, Pharmacy Records  cyanocobalamin  (VITAMIN B12) injection 1,000 mcg [id number]   Cathlene Marry Lenis, FNP  Active     Discontinued  05/12/24 1524 (Completed Course)   estradiol  (ESTRACE ) 1 MG tablet [id number]  Take 1 mg by mouth daily. [provider]  Active Self, Pharmacy Records  gentamicin  (GARAMYCIN ) 5 mg/kg in dextrose  5 % 50 mL IVPB [id number]   Vernetta Berg, MD  Active   inclisiran (LEQVIO ) 284 MG/1.5ML SOSY injection [id number]  Inject into the skin at 0 months, 3 months then every 6 months thereafter. Please apply copay card  CARD #:[id number] [id number] HMNLE:[id number] ERW:[id number] Nishan, Peter C, MD  Active   Multiple Vitamins-Minerals (MULTIVITAMIN GUMMIES ADULT) CHEW [id number]  Chew by mouth. [provider]  Active Self, Pharmacy Records  nitroGLYCERIN  (NITROSTAT ) 0.4 MG SL tablet [id number]  Place 1 tablet (0.4 mg total) under the tongue every 5 (five) minutes as needed for chest pain. PLACE 1 TABLET UNDER THE TONGUE EVERY 5 MIN X 3 DOSES AS NEEDED FOR CHEST PAIN. Lavell Bari LABOR, FNP  Active             Assessment/Plan:  Pre-Diabetes: A1c of 5.7% Reviewed dietary and lifestyle modifications including:  Increase protein intake with each meal Reduce intake of sweets and sugary beverages Aim for ~45 g of carbohydrates with each meal Increase physical exercise, as able Will provide additional resources and a summary of recommendations within the AVS for patient to review.   Hyperlipidemia/ASCVD Risk Reduction: Currently controlled. LDL within goal of <55 mg/dL. Reviewed long term complications of uncontrolled cholesterol Recommend to  Continue Leqvio   Follow Up Plan: PCP 06/25/24  Woodie Jock, PharmD PGY1 Pharmacy Resident  05/12/2024  Mliss Tarry Griffin, PharmD, BCACP, CPP Clinical Pharmacist, [facility]

## 2024-05-12 ENCOUNTER — Other Ambulatory Visit: Admitting: Pharmacist

## 2024-05-12 DIAGNOSIS — R7303 Prediabetes: Secondary | ICD-10-CM

## 2024-05-12 DIAGNOSIS — E782 Mixed hyperlipidemia: Secondary | ICD-10-CM

## 2024-05-12 NOTE — Patient Instructions (Addendum)
 It was a pleasure speaking with you today!   I know you've worked so hard to [employer] and lifestyle the past couple of years, and you've done a wonderful job! Moving forward, try to prioritize increasing your protein intake, aiming for approximately 45 grams of carbohydrates with each meal, and reducing your sweets intake.   If you wanted to research this more, google Novo Nordisk Healthy Plate Method - the first link should be a PDF. This document will provide you with guidance on the types of each macronutrient and current recommendations for servings. I will provide a picture of the Healthy Plate Method below.   Have a wonderful day! Woodie Jock, PharmD PGY1 Pharmacy Resident  05/12/2024

## 2024-05-26 ENCOUNTER — Encounter: Payer: Self-pay | Admitting: Cardiovascular Disease

## 2024-06-17 ENCOUNTER — Ambulatory Visit: Payer: Self-pay

## 2024-06-17 NOTE — Telephone Encounter (Signed)
 Apt scheduled.

## 2024-06-17 NOTE — Telephone Encounter (Signed)
 FYI Only or Action Required?: FYI only for provider.  Patient was last seen in primary care on 04/23/2024 by Lavell Bari LABOR, FNP.  Called Nurse Triage reporting Cough.  Symptoms began several days ago.  Interventions attempted: OTC medications: cough and cold and Rest, hydration, or home remedies.  Symptoms are: unchanged.  Triage Disposition: See Physician Within 24 Hours  Patient/caregiver understands and will follow disposition?: Yes   Copied from CRM [id number]. Topic: Clinical - Red Word Triage >> Jun 17, 2024 11:32 AM Mia F wrote: Red Word that prompted transfer to Nurse Triage: Cold symptoms that are progressing. Constant cough, congestion, and sore throat. Taken otc meds with no relief. Had a fever and ear pain. Denies SOB and chest pain. Has been going on since Sunday and cough is getting worse Reason for Disposition  [1] Continuous (nonstop) coughing interferes with work or school AND [2] no improvement using cough treatment per Care Advice  Answer Assessment - Initial Assessment Questions 1. ONSET: When did the cough begin?      Sunday  2. SEVERITY: How bad is the cough today?      constant 3. SPUTUM: Describe the color of your sputum (e.g., none, dry cough; clear, white, yellow, green)     Dry  4. HEMOPTYSIS: Are you coughing up any blood? If Yes, ask: How much? (e.g., flecks, streaks, tablespoons, etc.)     Dry  5. DIFFICULTY BREATHING: Are you having difficulty breathing? If Yes, ask: How bad is it? (e.g., mild, moderate, severe)      Denies  6. FEVER: Do you have a fever? If Yes, ask: What is your temperature, how was it measured, and when did it start?     Sunday and Monday now resolved.  7. CARDIAC HISTORY: Do you have any history of heart disease? (e.g., heart attack, congestive heart failure)       8. LUNG HISTORY: Do you have any history of lung disease?  (e.g., pulmonary embolus, asthma, emphysema)      9. PE RISK FACTORS: Do you have  a history of blood clots? (or: recent major surgery, recent prolonged travel, bedridden)      10 . OTHER SYMPTOMS: Do you have any other symptoms? (e.g., runny nose, wheezing, chest pain)     Congestion, ear pain, sore throat.  Protocols used: Cough - Acute Non-Productive-A-AH

## 2024-06-18 ENCOUNTER — Ambulatory Visit: Admitting: Family

## 2024-06-18 VITALS — BP 127/73 | HR 66 | Temp 97.2°F | Ht 62.0 in | Wt 148.6 lb

## 2024-06-18 DIAGNOSIS — R6889 Other general symptoms and signs: Secondary | ICD-10-CM | POA: Diagnosis not present

## 2024-06-18 DIAGNOSIS — J069 Acute upper respiratory infection, unspecified: Secondary | ICD-10-CM

## 2024-06-18 LAB — VERITOR FLU A/B WAIVED
Influenza A: NEGATIVE
Influenza B: NEGATIVE

## 2024-06-18 MED ORDER — PROMETHAZINE-DM 6.25-15 MG/5ML PO SYRP: 5.0000 mL | ORAL_SOLUTION | Freq: Three times a day (TID) | ORAL | 0 refills | Status: DC | PRN

## 2024-06-18 MED ORDER — BENZONATATE 200 MG PO CAPS: 200.0000 mg | ORAL_CAPSULE | Freq: Three times a day (TID) | ORAL | 1 refills | Status: DC | PRN

## 2024-06-18 MED ORDER — PREDNISONE 20 MG PO TABS: 40.0000 mg | ORAL_TABLET | Freq: Every day | ORAL | 0 refills | Status: AC

## 2024-06-18 NOTE — Patient Instructions (Signed)

## 2024-06-18 NOTE — Progress Notes (Signed)
 Subjective:    Patient ID: Margaret Dixon, female    DOB: March 23, 1961, 63 y.o.   MRN: [id number]  Chief Complaint  Patient presents with   Cough   Sore Throat   PT presents to the office today with cough, sore throat that started 4 days ago.  Cough This is a new problem. The current episode started in the past 7 days. The problem has been unchanged. The problem occurs every few minutes. The cough is Non-productive. Associated symptoms include chills, ear congestion, ear pain, a fever, headaches, nasal congestion, postnasal drip, a sore throat and shortness of breath. Pertinent negatives include no myalgias or wheezing. She has tried rest and OTC cough suppressant (tylenol ) for the symptoms. The treatment provided mild relief.  Sore Throat  Associated symptoms include coughing, ear pain, headaches and shortness of breath.      Review of Systems  Constitutional:  Positive for chills and fever.  HENT:  Positive for ear pain, postnasal drip and sore throat.   Respiratory:  Positive for cough and shortness of breath. Negative for wheezing.   Musculoskeletal:  Negative for myalgias.  Neurological:  Positive for headaches.  All other systems reviewed and are negative.   Social History   Socioeconomic History   Marital status: Divorced    Spouse name: Not on file   Number of children: 2   Years of education: Not on file   Highest education level: GED or equivalent  Occupational History   Not on file  Tobacco Use   Smoking status: Every Day    Current packs/day: 0.00    Average packs/day: 0.3 packs/day for 8.0 years (2.4 ttl pk-yrs)    Types: Cigarettes    Start date: 07/26/2006    Last attempt to quit: 07/26/2014    Years since quitting: 9.9   Smokeless tobacco: Never  Substance and Sexual Activity   Alcohol use: Yes    Comment: socially   Drug use: No   Sexual activity: Not on file  Other Topics Concern   Not on file  Social History Narrative   Lives alone.  Works  at [employer], sport and exercise.     Social Drivers of Corporate investment banker Strain: Low Risk  (06/18/2024)   Overall Financial Resource Strain (CARDIA)    Difficulty of Paying Living Expenses: Not very hard  Food Insecurity: No Food Insecurity (06/18/2024)   Hunger Vital Sign    Worried About Running Out of Food in the Last Year: Never true    Ran Out of Food in the Last Year: Never true  Transportation Needs: No Transportation Needs (06/18/2024)   PRAPARE - Administrator, Civil Service (Medical): No    Lack of Transportation (Non-Medical): No  Physical Activity: Insufficiently Active (06/18/2024)   Exercise Vital Sign    Days of Exercise per Week: 2 days    Minutes of Exercise per Session: 20 min  Stress: No Stress Concern Present (06/18/2024)   [employer] of Occupational Health - Occupational Stress Questionnaire    Feeling of Stress: Not at all  Social Connections: Moderately Isolated (06/18/2024)   Social Connection and Isolation Panel    Frequency of Communication with Friends and Family: Twice a week    Frequency of Social Gatherings with Friends and Family: Never    Attends Religious Services: More than 4 times per year    Active Member of [employer] or Organizations: Yes    Attends Banker Meetings: More  than 4 times per year    Marital Status: Divorced   Family History  Problem Relation Age of Onset   Cancer Mother    Coronary artery disease Father 38   Diabetes Father    Colon polyps Father    Emphysema Father    Cancer Father    Heart disease Father    Coronary artery disease Paternal Grandfather        Died of CAD at an early age.   Breast cancer Neg Hx         Objective:   Physical Exam Vitals reviewed.  Constitutional:      General: She is not in acute distress.    Appearance: She is well-developed.  HENT:     Head: Normocephalic and atraumatic.     Right Ear: Tympanic membrane normal.     Left Ear: Tympanic membrane  normal.     Nose: Congestion present.     Mouth/Throat:     Pharynx: Posterior oropharyngeal erythema present. No oropharyngeal exudate.  Eyes:     Pupils: Pupils are equal, round, and reactive to light.  Neck:     Thyroid : No thyromegaly.  Cardiovascular:     Rate and Rhythm: Normal rate and regular rhythm.     Heart sounds: Normal heart sounds. No murmur heard. Pulmonary:     Effort: Pulmonary effort is normal. No respiratory distress.     Breath sounds: Normal breath sounds. No wheezing.     Comments: Dry nonproductive cough Abdominal:     General: Bowel sounds are normal. There is no distension.     Palpations: Abdomen is soft.     Tenderness: There is no abdominal tenderness.  Musculoskeletal:        General: No tenderness. Normal range of motion.     Cervical back: Normal range of motion and neck supple.  Skin:    General: Skin is warm and dry.  Neurological:     Mental Status: She is alert and oriented to person, place, and time.     Cranial Nerves: No cranial nerve deficit.     Deep Tendon Reflexes: Reflexes are normal and symmetric.  Psychiatric:        Behavior: Behavior normal.        Thought Content: Thought content normal.        Judgment: Judgment normal.       BP 127/73   Pulse 66   Temp (!) 97.2 F (36.2 C) (Temporal)   Ht 5' 2 (1.575 m)   Wt 148 lb 9.6 oz (67.4 kg)   SpO2 97%   BMI 27.18 kg/m      Assessment & Plan:  MAIANA HENNIGAN comes in today with chief complaint of Cough and Sore Throat   Diagnosis and orders addressed:  1. Flu-like symptoms (Primary)  - Veritor Flu A/B Waived  2. Viral URI with cough - Take meds as prescribed - Use a cool mist humidifier  -Use saline nose sprays frequently -Force fluids -For any cough or congestion  Use plain Mucinex- regular strength or max strength is fine -For fever or aces or pains- take tylenol  or ibuprofen. -Throat lozenges if help -Follow up if symptoms worsen or do not improve  -  predniSONE  (DELTASONE ) 20 MG tablet; Take 2 tablets (40 mg total) by mouth daily with breakfast for 5 days.  Dispense: 10 tablet; Refill: 0 - benzonatate (TESSALON) 200 MG capsule; Take 1 capsule (200 mg total) by mouth 3 (three) times daily  as needed.  Dispense: 30 capsule; Refill: 1 - promethazine -dextromethorphan (PROMETHAZINE -DM) 6.25-15 MG/5ML syrup; Take 5 mLs by mouth 3 (three) times daily as needed for cough.  Dispense: 118 mL; Refill: 0     Bari Learn, FNP

## 2024-06-19 ENCOUNTER — Ambulatory Visit: Payer: Self-pay | Admitting: Family

## 2024-06-25 ENCOUNTER — Ambulatory Visit: Admitting: Family

## 2024-06-25 NOTE — Patient Instructions (Addendum)
 Hypothyroidism  Hypothyroidism is when the thyroid  gland does not make enough of certain hormones. This is called an underactive thyroid . The thyroid  gland is a small gland located in the lower front part of the neck, just in front of the windpipe (trachea). This gland makes hormones that help control how the body uses food for energy (metabolism) as well as how the heart and brain function. These hormones also play a role in keeping your bones strong. When the thyroid  is underactive, it produces too little of the hormones thyroxine (T4) and triiodothyronine (T3). What are the causes? This condition may be caused by: Hashimoto's disease. This is a disease in which the body's disease-fighting system (immune system) attacks the thyroid  gland. This is the most common cause. Viral infections. Pregnancy. Certain medicines. Birth defects. Problems with a gland in the center of the brain (pituitary gland). Lack of enough iodine in the diet. Other causes may include: Past radiation treatments to the head or neck for cancer. Past treatment with radioactive iodine. Past exposure to radiation in the environment. Past surgical removal of part or all of the thyroid . What increases the risk? You are more likely to develop this condition if: You are female. You have a family history of thyroid  conditions. You use a medicine called lithium. You take medicines that affect the immune system (immunosuppressants). What are the signs or symptoms? Common symptoms of this condition include: Not being able to tolerate cold. Feeling as though you have no energy (lethargy). Lack of appetite. Constipation. Sadness or depression. Weight gain that is not explained by a change in diet or exercise habits. Menstrual irregularity. Dry skin, coarse hair, or brittle nails. Other symptoms may include: Muscle pain. Slowing of thought processes. Poor memory. How is this diagnosed? This condition may be diagnosed  based on: Your symptoms, your medical history, and a physical exam. Blood tests. You may also have imaging tests, such as an ultrasound or MRI. How is this treated? This condition is treated with medicine that replaces the thyroid  hormones that your body does not make. After you begin treatment, it may take several weeks for symptoms to go away. Follow these instructions at home: Take over-the-counter and prescription medicines only as told by your health care provider. If you start taking any new medicines, tell your health care provider. Keep all follow-up visits as told by your health care provider. This is important. As your condition improves, your dosage of thyroid  hormone medicine may change. You will need to have blood tests regularly so that your health care provider can monitor your condition. Contact a health care provider if: Your symptoms do not get better with treatment. You are taking thyroid  hormone replacement medicine and you: Sweat a lot. Have tremors. Feel anxious. Lose weight rapidly. Cannot tolerate heat. Have emotional swings. Have diarrhea. Feel weak. Get help right away if: You have chest pain. You have an irregular heartbeat. You have a rapid heartbeat. You have difficulty breathing. These symptoms may be an emergency. Get help right away. Call 911. Do not wait to see if the symptoms will go away. Do not drive yourself to the hospital. Summary Hypothyroidism is when the thyroid  gland does not make enough of certain hormones (it is underactive). When the thyroid  is underactive, it produces too little of the hormones thyroxine (T4) and triiodothyronine (T3). The most common cause is Hashimoto's disease, a disease in which the body's disease-fighting system (immune system) attacks the thyroid  gland. The condition can also be caused  by viral infections, medicine, pregnancy, or past radiation treatment to the head or neck. Symptoms may include weight gain, dry  skin, constipation, feeling as though you do not have energy, and not being able to tolerate cold. This condition is treated with medicine to replace the thyroid  hormones that your body does not make. This information is not intended to replace advice given to you by your health care provider. Make sure you discuss any questions you have with your health care provider. Document Revised: 08/15/2021 Document Reviewed: 08/15/2021 Elsevier Patient Education  2024 Elsevier Inc.Our records indicate that based on your recent breast imaging examination performed on 07/29/23, it is time to schedule your appointment for the recommended follow up breast imaging. Remember that this is an important part of your follow-up care.  Please call [facility] at [phone] to schedule your appointment.

## 2024-06-29 ENCOUNTER — Encounter: Payer: Self-pay | Admitting: Family

## 2024-06-29 ENCOUNTER — Ambulatory Visit: Payer: Self-pay | Admitting: Family

## 2024-06-29 VITALS — BP 137/69 | HR 65 | Temp 97.0°F | Ht 62.0 in | Wt 149.6 lb

## 2024-06-29 DIAGNOSIS — Z23 Encounter for immunization: Secondary | ICD-10-CM | POA: Diagnosis not present

## 2024-06-29 DIAGNOSIS — I2510 Atherosclerotic heart disease of native coronary artery without angina pectoris: Secondary | ICD-10-CM

## 2024-06-29 DIAGNOSIS — R7303 Prediabetes: Secondary | ICD-10-CM

## 2024-06-29 DIAGNOSIS — R7989 Other specified abnormal findings of blood chemistry: Secondary | ICD-10-CM | POA: Diagnosis not present

## 2024-06-29 DIAGNOSIS — E782 Mixed hyperlipidemia: Secondary | ICD-10-CM

## 2024-06-29 DIAGNOSIS — D519 Vitamin B12 deficiency anemia, unspecified: Secondary | ICD-10-CM

## 2024-06-29 DIAGNOSIS — R928 Other abnormal and inconclusive findings on diagnostic imaging of breast: Secondary | ICD-10-CM

## 2024-06-29 NOTE — Progress Notes (Signed)
 Subjective:    Patient ID: Margaret Dixon, female    DOB: Jan 14, 1961, 63 y.o.   MRN: [id number]  Chief Complaint  Patient presents with   Hypothyroidism   Medical Management of Chronic Issues   PT presents to the office today for chronic follow up.  She is followed by Cardiologists annually for hx NSTEM in 2023 and CAD.   She had an abnormal TSH on 04/23/24 of 5.790. Hyperlipidemia This is a chronic problem. The current episode started more than 1 year ago. The problem is controlled. Recent lipid tests were reviewed and are normal. Exacerbating diseases include obesity. Treatments tried: leqvio . The current treatment provides moderate improvement of lipids. Risk factors for coronary artery disease include dyslipidemia, a sedentary lifestyle and post-menopausal.  Diabetes She presents for her follow-up diabetic visit. Diabetes type: hx of prediabetes. Pertinent negatives for hypoglycemia include no confusion. Associated symptoms include blurred vision. Pertinent negatives for diabetes include no foot paresthesias. Risk factors for coronary artery disease include dyslipidemia, sedentary lifestyle and post-menopausal. (Does not check )  Anemia Presents for follow-up visit. There has been no confusion, fever or malaise/fatigue.      Review of Systems  Constitutional:  Negative for fever and malaise/fatigue.  Eyes:  Positive for blurred vision.  Psychiatric/Behavioral:  Negative for confusion.   All other systems reviewed and are negative.   Social History   Socioeconomic History   Marital status: Divorced    Spouse name: Not on file   Number of children: 2   Years of education: Not on file   Highest education level: GED or equivalent  Occupational History   Not on file  Tobacco Use   Smoking status: Every Day    Current packs/day: 0.00    Average packs/day: 0.3 packs/day for 8.0 years (2.4 ttl pk-yrs)    Types: Cigarettes    Start date: 07/26/2006    Last attempt to  quit: 07/26/2014    Years since quitting: 9.9   Smokeless tobacco: Never  Substance and Sexual Activity   Alcohol use: Yes    Comment: socially   Drug use: No   Sexual activity: Not on file  Other Topics Concern   Not on file  Social History Narrative   Lives alone.  Works at psychologist, sport and exercise.     Social Drivers of Corporate Investment Banker Strain: Low Risk  (06/18/2024)   Overall Financial Resource Strain (CARDIA)    Difficulty of Paying Living Expenses: Not very hard  Food Insecurity: No Food Insecurity (06/18/2024)   Hunger Vital Sign    Worried About Running Out of Food in the Last Year: Never true    Ran Out of Food in the Last Year: Never true  Transportation Needs: No Transportation Needs (06/18/2024)   PRAPARE - Administrator, Civil Service (Medical): No    Lack of Transportation (Non-Medical): No  Physical Activity: Insufficiently Active (06/18/2024)   Exercise Vital Sign    Days of Exercise per Week: 2 days    Minutes of Exercise per Session: 20 min  Stress: No Stress Concern Present (06/18/2024)   [employer] of Occupational Health - Occupational Stress Questionnaire    Feeling of Stress: Not at all  Social Connections: Moderately Isolated (06/18/2024)   Social Connection and Isolation Panel    Frequency of Communication with Friends and Family: Twice a week    Frequency of Social Gatherings with Friends and Family: Never    Attends Religious Services:  More than 4 times per year    Active Member of Clubs or Organizations: Yes    Attends Engineer, Structural: More than 4 times per year    Marital Status: Divorced   Family History  Problem Relation Age of Onset   Cancer Mother    Coronary artery disease Father 40   Diabetes Father    Colon polyps Father    Emphysema Father    Cancer Father    Heart disease Father    Coronary artery disease Paternal Grandfather        Died of CAD at an early age.   Breast cancer Neg Hx          Objective:   Physical Exam Vitals reviewed.  Constitutional:      General: She is not in acute distress.    Appearance: She is well-developed.  HENT:     Head: Normocephalic and atraumatic.     Right Ear: Tympanic membrane normal.     Left Ear: Tympanic membrane normal.     Nose:     Right Sinus: Frontal sinus tenderness present.     Left Sinus: Frontal sinus tenderness present.  Eyes:     Pupils: Pupils are equal, round, and reactive to light.  Neck:     Thyroid : No thyromegaly.  Cardiovascular:     Rate and Rhythm: Normal rate and regular rhythm.     Heart sounds: Normal heart sounds. No murmur heard. Pulmonary:     Effort: Pulmonary effort is normal. No respiratory distress.     Breath sounds: Normal breath sounds. No wheezing.  Abdominal:     General: Bowel sounds are normal. There is no distension.     Palpations: Abdomen is soft.     Tenderness: There is no abdominal tenderness.  Musculoskeletal:        General: No tenderness. Normal range of motion.     Cervical back: Normal range of motion and neck supple.  Skin:    General: Skin is warm and dry.  Neurological:     Mental Status: She is alert and oriented to person, place, and time.     Cranial Nerves: No cranial nerve deficit.     Deep Tendon Reflexes: Reflexes are normal and symmetric.  Psychiatric:        Behavior: Behavior normal.        Thought Content: Thought content normal.        Judgment: Judgment normal.       BP 137/69   Pulse 65   Temp (!) 97 F (36.1 C) (Temporal)   Ht 5' 2 (1.575 m)   Wt 149 lb 9.6 oz (67.9 kg)   BMI 27.36 kg/m      Assessment & Plan:  Margaret Dixon comes in today with chief complaint of Hypothyroidism and Medical Management of Chronic Issues   Diagnosis and orders addressed:  1. Abnormal mammogram (Primary) - MM 3D DIAGNOSTIC MAMMOGRAM BILATERAL BREAST; Future  2. Encounter for immunization - Flu vaccine HIGH DOSE PF(Fluzone Trivalent)  3.  Abnormal TSH - Thyroid  Panel With TSH  4. Prediabetes Low carb diet  5. Mixed hyperlipidemia Low fat diet   6. Coronary artery disease involving native coronary artery of native heart without angina pectoris Keep follow up with Cardiologists   7. Anemia due to vitamin B12 deficiency, unspecified B12 deficiency type Continue B12   Labs pending Continue current medications  Keep follow up with specialists  Health Maintenance reviewed Diet and  exercise encouraged  Return in about 6 months (around 12/27/2024), or if symptoms worsen or fail to improve.    Bari Learn, FNP

## 2024-06-30 ENCOUNTER — Ambulatory Visit: Payer: Self-pay | Admitting: Family

## 2024-06-30 LAB — THYROID PANEL WITH TSH
Free Thyroxine Index: 1.8 (ref 1.2–4.9)
T3 Uptake Ratio: 25 % (ref 24–39)
T4, Total: 7.1 ug/dL (ref 4.5–12.0)
TSH: 4.06 u[IU]/mL (ref 0.450–4.500)

## 2024-07-10 ENCOUNTER — Ambulatory Visit
Admission: RE | Admit: 2024-07-10 | Discharge: 2024-07-10 | Disposition: A | Discharge status: Home / Self Care | Source: Ambulatory Visit | Attending: Family | Admitting: Family

## 2024-07-10 DIAGNOSIS — R928 Other abnormal and inconclusive findings on diagnostic imaging of breast: Secondary | ICD-10-CM

## 2024-07-17 NOTE — Progress Notes (Deleted)
 Cardiology Office Note:    Date:  07/17/2024   ID:  Margaret Dixon, DOB [birth year], MRN [id number]  PCP:  Lavell Bari LABOR, FNP   [facility] Providers Cardiologist:  Maude Emmer, MD}    Referring MD: Gladis Mustard, *   Chief Complaint: post hospital f/u CAD s/p DES to LCx  History of Present Illness:    Margaret Dixon is a 63 y.o. female with a hx of ACS, CAD s/p NSTEMI, hyperlipidemia, and tobacco abuse.   On 12/02/21, she presented to ED with substernal chest pain with elevated hs troponin at 83. Cardiac catheterization revealed single vessel obstructive CAD involving mid LCx 90% stenosed, successful PCI of mid LCx with DES x 1, normal LVEDP, continue DAPT for 1 year. She was discharged home on 12/04/21.  Post hospital had some dyspnea and fibromyalgia Brillinta changed to plavix  Lipitor  d/c and improved Given CAD told to start lower dose crestor but refused She is back at work as CNA Quit smoking since d/c as well     LDL was 89 04/03/23 she was intolerant to crestor and higher dose lipitor  Willing to start PSK9   Myovue 09/27/22 normal no ischemia EF 71% Echo 09/27/22 EF 60-65% Normal RV AV sclerosis    Called office 8/24 with [facility] while at work 30 minutes Sharp left sided Took one nitro with some relief Had just finished lunch and was getting resident dressed for the day She was anxious 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 She has bad vasomotor symptoms when not on it  ***   Past Medical History:  Diagnosis Date   Allergy 2014   Fish, some antibiotics   Arthritis 2025   In the upper part of my neck   CHF (congestive heart failure) (HCC) 11/25/21   Had a heart attack   Constipation    Depression    due to Fibromyalgia   Diarrhea    Fibromyalgia    Hormone replacement  therapy, postmenopausal    PONV (postoperative nausea and vomiting)     Past Surgical History:  Procedure Laterality Date   ABDOMINAL ADHESION SURGERY  07/28/2015   APPENDECTOMY     BREAST BIOPSY Right 07/29/2023   MM RT BREAST BX W LOC DEV 1ST LESION IMAGE BX SPEC STEREO GUIDE 07/29/2023 [facility]   CHOLECYSTECTOMY     COLONOSCOPY     CORONARY STENT INTERVENTION N/A 12/04/2021   Procedure: CORONARY STENT INTERVENTION;  Surgeon: Jordan, Trevelle Mcgurn M, MD;  Location: [facility];  Service: Cardiovascular;  Laterality: N/A;  circumflex   INCISION AND DRAINAGE ABSCESS N/A 11/03/2015   Procedure: EXCISION CHRONIC ABDOMINAL WALL STITCH ABSCESS;  Surgeon: Vicenta Poli, MD;  Location: [facility];  Service: General;  Laterality: N/A;   LAPAROTOMY N/A 07/28/2015   Procedure: EXPLORATORY LAPAROTOMY;  Surgeon: Vicenta Poli, MD;  Location: [facility] OR;  Service: General;  Laterality: N/A;   LEFT HEART CATH AND CORONARY ANGIOGRAPHY N/A 12/04/2021   Procedure: LEFT HEART CATH AND CORONARY ANGIOGRAPHY;  Surgeon: Jordan, Latitia Housewright M, MD;  Location: [facility];  Service: Cardiovascular;  Laterality: N/A;   LYSIS OF ADHESION N/A 07/28/2015   Procedure: LYSIS OF ADHESION;  Surgeon: Vicenta Poli, MD;  Location: [facility] OR;  Service: General;  Laterality: N/A;   TONSILLECTOMY  VAGINAL HYSTERECTOMY      Current Medications: No outpatient medications have been marked as taking for the 07/20/24 encounter (Appointment) with Kenza Munar C, MD.   Current Facility-Administered Medications for the 07/20/24 encounter (Appointment) with Delford Maude BROCKS, MD  Medication   cyanocobalamin  (VITAMIN B12) injection 1,000 mcg     Allergies:   Penicillins, Repatha  [evolocumab ], Shellfish allergy, Ticagrelor , Cephalexin, Clarithromycin, and Sulfa antibiotics   Social History   Socioeconomic History   Marital status: Divorced    Spouse name: Not on file   Number of children: 2   Years of  education: Not on file   Highest education level: GED or equivalent  Occupational History   Not on file  Tobacco Use   Smoking status: Every Day    Current packs/day: 0.00    Average packs/day: 0.3 packs/day for 8.0 years (2.4 ttl pk-yrs)    Types: Cigarettes    Start date: 07/26/2006    Last attempt to quit: 07/26/2014    Years since quitting: 9.9   Smokeless tobacco: Never  Substance and Sexual Activity   Alcohol use: Yes    Comment: socially   Drug use: No   Sexual activity: Not on file  Other Topics Concern   Not on file  Social History Narrative   Lives alone.  Works at psychologist, sport and exercise.     Social Drivers of Corporate Investment Banker Strain: Low Risk  (06/18/2024)   Overall Financial Resource Strain (CARDIA)    Difficulty of Paying Living Expenses: Not very hard  Food Insecurity: No Food Insecurity (06/18/2024)   Hunger Vital Sign    Worried About Running Out of Food in the Last Year: Never true    Ran Out of Food in the Last Year: Never true  Transportation Needs: No Transportation Needs (06/18/2024)   PRAPARE - Administrator, Civil Service (Medical): No    Lack of Transportation (Non-Medical): No  Physical Activity: Insufficiently Active (06/18/2024)   Exercise Vital Sign    Days of Exercise per Week: 2 days    Minutes of Exercise per Session: 20 min  Stress: No Stress Concern Present (06/18/2024)   [employer] of Occupational Health - Occupational Stress Questionnaire    Feeling of Stress: Not at all  Social Connections: Moderately Isolated (06/18/2024)   Social Connection and Isolation Panel    Frequency of Communication with Friends and Family: Twice a week    Frequency of Social Gatherings with Friends and Family: Never    Attends Religious Services: More than 4 times per year    Active Member of [employer] or Organizations: Yes    Attends Engineer, Structural: More than 4 times per year    Marital Status: Divorced      Family History: The patient's family history includes Cancer in her father and mother; Colon polyps in her father; Coronary artery disease in her paternal grandfather; Coronary artery disease (age of onset: 54) in her father; Diabetes in her father; Emphysema in her father; Heart disease in her father. There is no history of Breast cancer.  ROS:   Please see the history of present illness.  All other systems reviewed and are negative.  Labs/Other Studies Reviewed:    The following studies were reviewed today:  Myovue 09/27/22      The study is normal. The study is low risk.   No ST deviation was noted.   LV perfusion is normal. There is no evidence of  ischemia. There is no evidence of infarction.   Left ventricular function is normal. Nuclear stress EF: 71 %. The left ventricular ejection fraction is hyperdynamic (>65%). End diastolic cavity size is normal. End systolic cavity size is normal.  Echo:  09/27/22  IMPRESSIONS     1. Left ventricular ejection fraction, by estimation, is 60 to 65%. The  left ventricle has normal function. The left ventricle has no regional  wall motion abnormalities. Left ventricular diastolic parameters were  normal.   2. Right ventricular systolic function is normal. The right ventricular  size is normal. There is normal pulmonary artery systolic pressure. The  estimated right ventricular systolic pressure is 25.1 mmHg.   3. The mitral valve is normal in structure. Trivial mitral valve  regurgitation.   4. The aortic valve is tricuspid. There is mild thickening of the aortic  valve. Aortic valve regurgitation is not visualized. Aortic valve  sclerosis is present, with no evidence of aortic valve stenosis.   5. The inferior vena cava is normal in size with greater than 50%  respiratory variability, suggesting right atrial pressure of 3 mmHg.    Normal resting and stress perfusion. No ischemia or infarction EF 71%   LHC 12/04/21    1st Diag lesion  is 60% stenosed.   1st Mrg lesion is 50% stenosed.   Mid Cx lesion is 90% stenosed.   A drug-eluting stent was successfully placed using a STENT ONYX FRONTIER 2.5X15.   Post intervention, there is a 0% residual stenosis.   LV end diastolic pressure is normal.   Single vessel obstructive CAD involving the mid LCx Normal LVEDP Successful PCI of the mid LCx with DES x 1   Plan: DAPT for one year. Anticipate same day DC after 6 hours   Echo 12/03/21  Left Ventricle: Left ventricular ejection fraction, by estimation, is 60  to 65%. The left ventricle has normal function. The left ventricle has no  regional wall motion abnormalities. The left ventricular internal cavity  size was normal in size. There is mild left ventricular hypertrophy.  Left ventricular diastolic parameters are indeterminate.  Right Ventricle: The right ventricular size is normal. No increase in  right ventricular wall thickness. Right ventricular systolic function is  normal. There is normal pulmonary artery systolic pressure. The tricuspid  regurgitant velocity is 1.64 m/s, and   with an assumed right atrial pressure of 3 mmHg, the estimated right  ventricular systolic pressure is 13.8 mmHg.  Left Atrium: Left atrial size was normal in size.  Right Atrium: Right atrial size was normal in size.  Pericardium: There is no evidence of pericardial effusion.  Mitral Valve: The mitral valve is grossly normal. No evidence of mitral  valve regurgitation.  Tricuspid Valve: The tricuspid valve is grossly normal. Tricuspid valve  regurgitation is not demonstrated.  Aortic Valve: The aortic valve is grossly normal. Aortic valve  regurgitation is not visualized.  Pulmonic Valve: The pulmonic valve was not well visualized. Pulmonic valve  regurgitation is not visualized.  Aorta: The aortic root and ascending aorta are structurally normal, with  no evidence of dilitation.  Venous: The inferior vena cava is normal in size with  greater than 50%  respiratory variability, suggesting right atrial pressure of 3 mmHg.  IAS/Shunts: No atrial level shunt detected by color flow Doppler.     Recent Labs: 04/23/2024: ALT 24; BUN 14; Creatinine, Ser 1.02; Hemoglobin 14.5; Platelets 279; Potassium 4.9; Sodium 141 06/29/2024: TSH 4.060  Recent Lipid Panel  Component Value Date/Time   CHOL 109 04/23/2024 1157   TRIG 148 04/23/2024 1157   HDL 46 04/23/2024 1157   CHOLHDL 2.4 04/23/2024 1157   CHOLHDL 4.9 12/02/2021 2102   VLDL 43 (H) 12/02/2021 2102   LDLCALC 38 04/23/2024 1157    Physical Exam:    VS:  There were no vitals taken for this visit.    Wt Readings from Last 3 Encounters:  06/29/24 149 lb 9.6 oz (67.9 kg)  06/18/24 148 lb 9.6 oz (67.4 kg)  04/23/24 149 lb 9.6 oz (67.9 kg)     GEN:  Well nourished, well developed in no acute distress HEENT: Normal NECK: No JVD; No carotid bruits CARDIAC: RRR, no murmurs, rubs, gallops RESPIRATORY:  Clear to auscultation without rales, wheezing or rhonchi  ABDOMEN: Soft, non-tender, non-distended MUSCULOSKELETAL:  No edema; No deformity. 2+ pedal pulses, equal bilaterally SKIN: Warm and dry NEUROLOGIC:  Alert and oriented x 3 PSYCHIATRIC:  Normal affect   EKG:  07/17/2024 NSR Rate 62 normal   Diagnoses:    No diagnosis found.   Assessment and Plan:     CAD s/p ACS s/p DES to mLCX without angina: Single-vessel obstructive CAD with successful PCI of mid LCx with DES x1.  Plan to continue DAPT for 1 year 12/05/22   She is tolerating aspirin  and plavix . She stopped Brillinta due to dyspnea and cost. Was supposed to be on coreg  after last visit with NP 12/18/21 but did not start Observe Normal myovue 09/27/22   ECG today is normal SR rate 59   Tobacco abuse: She has completely stopped smoking.  I congratulated her on this achievement.  Hyperlipidemia LDL goal < 70/Hypertriglyceridemia: started on Leqvio  12/18/23 LDL 38 mproved   4.  Thyroid :  TSH 5.7 04/23/24  continue synthroid replacement  5.  Estrogen:  she has been estrogen for 35 years and has severe vasomotor symptoms when not on it. The WHI and HERS data have been controversial and lower risk in patients who started taking early post hysterectomy as opposed to later I think it would be ok for patient to use trans dermal formulation of estrogen if she feels her vasomotor symptoms are intolerable. Highest risk is DVT not MI. With her not smoking and on PSK9 plaque stability should be better even with estrogen   ***  Disposition: F/U in a year      Signed, Maude Emmer, MD  07/17/2024 3:01 PM    [facility]

## 2024-07-20 ENCOUNTER — Ambulatory Visit: Admitting: Cardiovascular Disease

## 2024-07-30 NOTE — Progress Notes (Signed)
 Cardiology Office Note:    Date:  08/07/2024   ID:  Margaret Dixon, DOB 1961/06/19, MRN [id number]  PCP:  Lavell Bari LABOR, FNP   [facility] Providers Cardiologist:  Maude Emmer, MD}    Referring MD: Gladis Mustard, FNP   Chief Complaint: post hospital f/u CAD s/p DES to LCx  History of Present Illness:    Margaret Dixon is a 63 y.o. female with a hx of ACS, CAD s/p NSTEMI, hyperlipidemia, and tobacco abuse.   On 12/02/21, she presented to ED with substernal chest pain with elevated hs troponin at 83. Cardiac catheterization revealed single vessel obstructive CAD involving mid LCx 90% stenosed, successful PCI of mid LCx with DES x 1, normal LVEDP, continue DAPT for 1 year. She was discharged home on 12/04/21.  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 She has bad vasomotor symptoms when not on it No back on oral pill  She has two daughters in [place] one got married in May 2025. 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    Past Medical History:  Diagnosis Date   Allergy 2014   Fish, some antibiotics   Arthritis 2025   In the upper part of my neck   CHF (congestive heart failure)  (HCC) 11/25/21   Had a heart attack   Constipation    Depression    due to Fibromyalgia   Diarrhea    Fibromyalgia    Hormone replacement therapy, postmenopausal    PONV (postoperative nausea and vomiting)     Past Surgical History:  Procedure Laterality Date   ABDOMINAL ADHESION SURGERY  07/28/2015   APPENDECTOMY     BREAST BIOPSY Right 07/29/2023   MM RT BREAST BX W LOC DEV 1ST LESION IMAGE BX SPEC STEREO GUIDE 07/29/2023 [facility]   CHOLECYSTECTOMY     COLONOSCOPY     CORONARY STENT INTERVENTION N/A 12/04/2021   Procedure: CORONARY STENT INTERVENTION;  Surgeon: Jordan, Shunna Mikaelian M, MD;  Location: [facility];  Service: Cardiovascular;  Laterality: N/A;  circumflex   INCISION AND DRAINAGE ABSCESS N/A 11/03/2015   Procedure: EXCISION CHRONIC ABDOMINAL WALL STITCH ABSCESS;  Surgeon: Vicenta Poli, MD;  Location: [facility];  Service: General;  Laterality: N/A;   LAPAROTOMY N/A 07/28/2015   Procedure: EXPLORATORY LAPAROTOMY;  Surgeon: Vicenta Poli, MD;  Location: [facility];  Service: General;  Laterality: N/A;   LEFT HEART CATH AND CORONARY ANGIOGRAPHY N/A 12/04/2021   Procedure: LEFT HEART CATH AND CORONARY ANGIOGRAPHY;  Surgeon: Jordan, Cylan Borum M, MD;  Location: [facility];  Service: Cardiovascular;  Laterality: N/A;   LYSIS OF ADHESION N/A 07/28/2015  Procedure: LYSIS OF ADHESION;  Surgeon: Vicenta Poli, MD;  Location: [facility];  Service: General;  Laterality: N/A;   TONSILLECTOMY     VAGINAL HYSTERECTOMY      Current Medications: Current Meds  Medication Sig   Ascorbic Acid (VITAMIN C GUMMIES PO) Take by mouth.   ASPIRIN  LOW DOSE 81 MG chewable tablet CHEW 1 TABLET BY MOUTH EVERY DAY   benzonatate  (TESSALON ) 200 MG capsule Take 1 capsule (200 mg total) by mouth 3 (three) times daily as needed.   cholecalciferol (VITAMIN D3) 25 MCG (1000 UNIT) tablet Take 1,000 Units by mouth daily.   cyanocobalamin  (VITAMIN B12) 1000 MCG/ML injection Inject 1 mL  (1,000 mcg total) into the muscle every 14 (fourteen) days.   estradiol  (ESTRACE ) 1 MG tablet Take 1 mg by mouth daily.   inclisiran (LEQVIO ) 284 MG/1.5ML SOSY injection Inject into the skin at 0 months, 3 months then every 6 months thereafter. Please apply copay card CARD #:[id number] APW:[id number] HMNLE:[id number] ERW:NYRE   Multiple Vitamins-Minerals (MULTIVITAMIN GUMMIES ADULT) CHEW Chew by mouth.   nitroGLYCERIN  (NITROSTAT ) 0.4 MG SL tablet Place 1 tablet (0.4 mg total) under the tongue every 5 (five) minutes as needed for chest pain. PLACE 1 TABLET UNDER THE TONGUE EVERY 5 MIN X 3 DOSES AS NEEDED FOR CHEST PAIN.   Vitamin D , Ergocalciferol , (DRISDOL) 1.25 MG (50000 UNIT) CAPS capsule Take 50,000 Units by mouth once a week.   Current Facility-Administered Medications for the 08/07/24 encounter (Office Visit) with Delford Maude BROCKS, MD  Medication   cyanocobalamin  (VITAMIN B12) injection 1,000 mcg     Allergies:   Penicillins, Repatha  [evolocumab ], Shellfish allergy, Ticagrelor , Cephalexin, Clarithromycin, and Sulfa antibiotics   Social History   Socioeconomic History   Marital status: Divorced    Spouse name: Not on file   Number of children: 2   Years of education: Not on file   Highest education level: GED or equivalent  Occupational History   Not on file  Tobacco Use   Smoking status: Every Day    Current packs/day: 0.00    Average packs/day: 0.3 packs/day for 8.0 years (2.4 ttl pk-yrs)    Types: Cigarettes    Start date: 07/26/2006    Last attempt to quit: 07/26/2014    Years since quitting: 10.0   Smokeless tobacco: Never  Substance and Sexual Activity   Alcohol use: Yes    Comment: socially   Drug use: No   Sexual activity: Not on file  Other Topics Concern   Not on file  Social History Narrative   Lives alone.  Works at psychologist, sport and exercise.     Social Drivers of Health   Tobacco Use: High Risk (08/07/2024)   Patient History    Smoking Tobacco Use: Every Day     Smokeless Tobacco Use: Never    Passive Exposure: Not on file  Financial Resource Strain: Low Risk (06/18/2024)   Overall Financial Resource Strain (CARDIA)    Difficulty of Paying Living Expenses: Not very hard  Food Insecurity: No Food Insecurity (06/18/2024)   Epic    Worried About Programme Researcher, Broadcasting/film/video in the Last Year: Never true    Ran Out of Food in the Last Year: Never true  Transportation Needs: No Transportation Needs (06/18/2024)   Epic    Lack of Transportation (Medical): No    Lack of Transportation (Non-Medical): No  Physical Activity: Insufficiently Active (06/18/2024)   Exercise Vital Sign    Days of Exercise per Week:  2 days    Minutes of Exercise per Session: 20 min  Stress: No Stress Concern Present (06/18/2024)   [employer] of Occupational Health - Occupational Stress Questionnaire    Feeling of Stress: Not at all  Social Connections: Moderately Isolated (06/18/2024)   Social Connection and Isolation Panel    Frequency of Communication with Friends and Family: Twice a week    Frequency of Social Gatherings with Friends and Family: Never    Attends Religious Services: More than 4 times per year    Active Member of Clubs or Organizations: Yes    Attends Banker Meetings: More than 4 times per year    Marital Status: Divorced  Depression (PHQ2-9): Low Risk (06/29/2024)   Depression (PHQ2-9)    PHQ-2 Score: 0  Alcohol Screen: Not on file  Housing: Unknown (06/18/2024)   Epic    Unable to Pay for Housing in the Last Year: No    Number of Times Moved in the Last Year: Not on file    Homeless in the Last Year: No  Utilities: Not At Risk (12/10/2023)   Received from [facility] Utilities    Threatened with loss of utilities: No  Health Literacy: Not on file     Family History: The patient's family history includes Cancer in her father and mother; Colon polyps in her father; Coronary artery disease in her paternal grandfather;  Coronary artery disease (age of onset: 11) in her father; Diabetes in her father; Emphysema in her father; Heart disease in her father. There is no history of Breast cancer.  ROS:   Please see the history of present illness.  All other systems reviewed and are negative.  Labs/Other Studies Reviewed:    The following studies were reviewed today:  Myovue 09/27/22      The study is normal. The study is low risk.   No ST deviation was noted.   LV perfusion is normal. There is no evidence of ischemia. There is no evidence of infarction.   Left ventricular function is normal. Nuclear stress EF: 71 %. The left ventricular ejection fraction is hyperdynamic (>65%). End diastolic cavity size is normal. End systolic cavity size is normal.  Echo:  09/27/22  IMPRESSIONS     1. Left ventricular ejection fraction, by estimation, is 60 to 65%. The  left ventricle has normal function. The left ventricle has no regional  wall motion abnormalities. Left ventricular diastolic parameters were  normal.   2. Right ventricular systolic function is normal. The right ventricular  size is normal. There is normal pulmonary artery systolic pressure. The  estimated right ventricular systolic pressure is 25.1 mmHg.   3. The mitral valve is normal in structure. Trivial mitral valve  regurgitation.   4. The aortic valve is tricuspid. There is mild thickening of the aortic  valve. Aortic valve regurgitation is not visualized. Aortic valve  sclerosis is present, with no evidence of aortic valve stenosis.   5. The inferior vena cava is normal in size with greater than 50%  respiratory variability, suggesting right atrial pressure of 3 mmHg.    Normal resting and stress perfusion. No ischemia or infarction EF 71%   LHC 12/04/21    1st Diag lesion is 60% stenosed.   1st Mrg lesion is 50% stenosed.   Mid Cx lesion is 90% stenosed.   A drug-eluting stent was successfully placed using a STENT ONYX FRONTIER 2.5X15.    Post intervention, there is a  0% residual stenosis.   LV end diastolic pressure is normal.   Single vessel obstructive CAD involving the mid LCx Normal LVEDP Successful PCI of the mid LCx with DES x 1   Plan: DAPT for one year. Anticipate same day DC after 6 hours   Echo 12/03/21  Left Ventricle: Left ventricular ejection fraction, by estimation, is 60  to 65%. The left ventricle has normal function. The left ventricle has no  regional wall motion abnormalities. The left ventricular internal cavity  size was normal in size. There is mild left ventricular hypertrophy.  Left ventricular diastolic parameters are indeterminate.  Right Ventricle: The right ventricular size is normal. No increase in  right ventricular wall thickness. Right ventricular systolic function is  normal. There is normal pulmonary artery systolic pressure. The tricuspid  regurgitant velocity is 1.64 m/s, and   with an assumed right atrial pressure of 3 mmHg, the estimated right  ventricular systolic pressure is 13.8 mmHg.  Left Atrium: Left atrial size was normal in size.  Right Atrium: Right atrial size was normal in size.  Pericardium: There is no evidence of pericardial effusion.  Mitral Valve: The mitral valve is grossly normal. No evidence of mitral  valve regurgitation.  Tricuspid Valve: The tricuspid valve is grossly normal. Tricuspid valve  regurgitation is not demonstrated.  Aortic Valve: The aortic valve is grossly normal. Aortic valve  regurgitation is not visualized.  Pulmonic Valve: The pulmonic valve was not well visualized. Pulmonic valve  regurgitation is not visualized.  Aorta: The aortic root and ascending aorta are structurally normal, with  no evidence of dilitation.  Venous: The inferior vena cava is normal in size with greater than 50%  respiratory variability, suggesting right atrial pressure of 3 mmHg.  IAS/Shunts: No atrial level shunt detected by color flow Doppler.     Recent  Labs: 04/23/2024: ALT 24; BUN 14; Creatinine, Ser 1.02; Hemoglobin 14.5; Platelets 279; Potassium 4.9; Sodium 141 06/29/2024: TSH 4.060  Recent Lipid Panel    Component Value Date/Time   CHOL 109 04/23/2024 1157   TRIG 148 04/23/2024 1157   HDL 46 04/23/2024 1157   CHOLHDL 2.4 04/23/2024 1157   CHOLHDL 4.9 12/02/2021 2102   VLDL 43 (H) 12/02/2021 2102   LDLCALC 38 04/23/2024 1157    Physical Exam:    VS:  BP 132/68   Pulse (!) 58   Ht 5' 2 (1.575 m)   Wt 149 lb 9.6 oz (67.9 kg)   SpO2 98%   BMI 27.36 kg/m     Wt Readings from Last 3 Encounters:  08/07/24 149 lb 9.6 oz (67.9 kg)  06/29/24 149 lb 9.6 oz (67.9 kg)  06/18/24 148 lb 9.6 oz (67.4 kg)     GEN:  Well nourished, well developed in no acute distress HEENT: Normal NECK: No JVD; No carotid bruits CARDIAC: RRR, no murmurs, rubs, gallops RESPIRATORY:  Clear to auscultation without rales, wheezing or rhonchi  ABDOMEN: Soft, non-tender, non-distended MUSCULOSKELETAL:  No edema; No deformity. 2+ pedal pulses, equal bilaterally SKIN: Warm and dry NEUROLOGIC:  Alert and oriented x 3 PSYCHIATRIC:  Normal affect   EKG:  08/07/2024 NSR Rate 62 normal   Diagnoses:    1. Murmur      Assessment and Plan:     CAD s/p ACS s/p DES to mLCX without angina: Single-vessel obstructive CAD with successful PCI of mid LCx with DES x1.  Plan to continue DAPT for 1 year 12/05/22   She is tolerating  aspirin  and plavix . She stopped Brillinta due to dyspnea and cost. Was supposed to be on coreg  after last visit with NP 12/18/21 but did not start Observe Normal myovue 09/27/22   ECG today is normal SR rate 58  Tobacco abuse: She has completely stopped smoking.  I congratulated her on this achievement.  Hyperlipidemia LDL goal < 70/Hypertriglyceridemia: started on Leqvio  12/18/23 LDL 38 mproved   4.  Thyroid :  TSH 5.7 04/23/24 continue synthroid replacement  5.  Estrogen:  she has been estrogen for 35 years and has severe vasomotor  symptoms when not on it. The WHI and HERS data have been controversial and lower risk in patients who started taking early post hysterectomy as opposed to later I think it would be ok for patient to use trans dermal formulation of estrogen if she feels her vasomotor symptoms are intolerable. Highest risk is DVT not MI. With her not smoking and on PSK9 plaque stability should be better even with estrogen    Disposition: F/U in a year      Signed, Maude Emmer, MD  08/07/2024 2:06 PM    [facility]

## 2024-07-31 ENCOUNTER — Other Ambulatory Visit: Payer: Self-pay | Admitting: Family

## 2024-07-31 NOTE — Telephone Encounter (Unsigned)
 Copied from CRM #[id number]. Topic: Clinical - Medication Refill >> Jul 31, 2024 11:53 AM Leonette P wrote: Medication: cyanocobalamin  B 12  Has the patient contacted their pharmacy? Yes (Agent: If no, request that the patient contact the pharmacy for the refill. If patient does not wish to contact the pharmacy document the reason why and proceed with request.) (Agent: If yes, when and what did the pharmacy advise?)  This is the patient's preferred pharmacy:  [id number] - [place],  - [address] [place] [address] Phone: [phone] Fax: [phone]   Is this the correct pharmacy for this prescription? Yes If no, delete pharmacy and type the correct one.   Has the prescription been filled recently? Yes  Is the patient out of the medication? Yes  Has the patient been seen for an appointment in the last year OR does the patient have an upcoming appointment? Yes  Can we respond through MyChart? No  Agent: Please be advised that Rx refills may take up to 3 business days. We ask that you follow-up with your pharmacy.

## 2024-08-03 ENCOUNTER — Ambulatory Visit: Admitting: Cardiovascular Disease

## 2024-08-03 MED ORDER — CYANOCOBALAMIN 1000 MCG/ML IJ SOLN: 1000.0000 ug | INTRAMUSCULAR | 6 refills | Status: DC

## 2024-08-07 ENCOUNTER — Encounter: Payer: Self-pay | Admitting: Cardiovascular Disease

## 2024-08-07 ENCOUNTER — Ambulatory Visit: Attending: Cardiovascular Disease | Admitting: Cardiovascular Disease

## 2024-08-07 VITALS — BP 132/68 | HR 58 | Ht 62.0 in | Wt 149.6 lb

## 2024-08-07 DIAGNOSIS — I2510 Atherosclerotic heart disease of native coronary artery without angina pectoris: Secondary | ICD-10-CM | POA: Diagnosis not present

## 2024-08-07 DIAGNOSIS — E785 Hyperlipidemia, unspecified: Secondary | ICD-10-CM

## 2024-08-07 DIAGNOSIS — R011 Cardiac murmur, unspecified: Secondary | ICD-10-CM | POA: Diagnosis not present

## 2024-08-07 DIAGNOSIS — Z720 Tobacco use: Secondary | ICD-10-CM | POA: Diagnosis not present

## 2024-08-07 NOTE — Patient Instructions (Addendum)
 Medication Instructions:   Your physician recommends that you continue on your current medications as directed. Please refer to the Current Medication list given to you today.  *If you need a refill on your cardiac medications before your next appointment, please call your pharmacy*    Follow-Up: At [facility], you and your health needs are our priority.  As part of our continuing mission to provide you with exceptional heart care, our providers are all part of one team.  This team includes your primary Cardiologist (physician) and Advanced Practice Providers or APPs (Physician Assistants and Nurse Practitioners) who all work together to provide you with the care you need, when you need it.  Your next appointment:   1 year(s)  Provider:   Janelle Mediate, MD

## 2024-08-29 ENCOUNTER — Encounter: Payer: Self-pay | Admitting: Cardiovascular Disease

## 2024-09-02 ENCOUNTER — Encounter: Payer: Self-pay | Admitting: Cardiovascular Disease

## 2024-09-07 ENCOUNTER — Encounter: Payer: Self-pay | Admitting: Family

## 2024-09-07 ENCOUNTER — Ambulatory Visit: Admitting: Family

## 2024-09-07 VITALS — BP 126/71 | HR 70 | Temp 97.4°F | Ht 62.0 in | Wt 148.6 lb

## 2024-09-07 DIAGNOSIS — E559 Vitamin D deficiency, unspecified: Secondary | ICD-10-CM

## 2024-09-07 DIAGNOSIS — N951 Menopausal and female climacteric states: Secondary | ICD-10-CM

## 2024-09-07 DIAGNOSIS — H6993 Unspecified Eustachian tube disorder, bilateral: Secondary | ICD-10-CM

## 2024-09-07 DIAGNOSIS — R7303 Prediabetes: Secondary | ICD-10-CM | POA: Diagnosis not present

## 2024-09-07 DIAGNOSIS — I2510 Atherosclerotic heart disease of native coronary artery without angina pectoris: Secondary | ICD-10-CM | POA: Diagnosis not present

## 2024-09-07 DIAGNOSIS — I249 Acute ischemic heart disease, unspecified: Secondary | ICD-10-CM

## 2024-09-07 DIAGNOSIS — Z87891 Personal history of nicotine dependence: Secondary | ICD-10-CM

## 2024-09-07 DIAGNOSIS — E782 Mixed hyperlipidemia: Secondary | ICD-10-CM

## 2024-09-07 DIAGNOSIS — D519 Vitamin B12 deficiency anemia, unspecified: Secondary | ICD-10-CM | POA: Diagnosis not present

## 2024-09-07 DIAGNOSIS — Z789 Other specified health status: Secondary | ICD-10-CM | POA: Insufficient documentation

## 2024-09-07 DIAGNOSIS — E538 Deficiency of other specified B group vitamins: Secondary | ICD-10-CM | POA: Diagnosis not present

## 2024-09-07 DIAGNOSIS — M797 Fibromyalgia: Secondary | ICD-10-CM | POA: Diagnosis not present

## 2024-09-07 DIAGNOSIS — I252 Old myocardial infarction: Secondary | ICD-10-CM | POA: Diagnosis not present

## 2024-09-07 MED ORDER — FLUTICASONE PROPIONATE 50 MCG/ACT NA SUSP: 2.0000 | Freq: Every day | NASAL | 6 refills | Status: AC

## 2024-09-07 MED ORDER — CETIRIZINE HCL 10 MG PO TABS: 10.0000 mg | ORAL_TABLET | Freq: Every day | ORAL | 1 refills | Status: AC

## 2024-09-07 MED ORDER — CYANOCOBALAMIN 1000 MCG/ML IJ SOLN: 1000.0000 ug | INTRAMUSCULAR | 6 refills | Status: AC

## 2024-09-07 MED ORDER — ESTRADIOL 1 MG PO TABS: 1.0000 mg | ORAL_TABLET | Freq: Every day | ORAL | 1 refills | Status: AC

## 2024-09-07 NOTE — Patient Instructions (Signed)
 Eustachian Tube Dysfunction  Eustachian tube dysfunction refers to a condition in which a blockage develops in the narrow passage that connects the middle ear to the back of the nose (eustachian tube). The eustachian tube regulates air pressure in the middle ear by letting air move between the ear and nose. It also helps to drain fluid from the middle ear space. Eustachian tube dysfunction can affect one or both ears. When the eustachian tube does not function properly, air pressure, fluid, or both can build up in the middle ear. What are the causes? This condition occurs when the eustachian tube becomes blocked or cannot open normally. Common causes of this condition include: Ear infections. Colds and other infections that affect the nose, mouth, and throat (upper respiratory tract). Allergies. Irritation from cigarette smoke. Irritation from stomach acid coming up into the esophagus (gastroesophageal reflux). The esophagus is the part of the body that moves food from the mouth to the stomach. Sudden changes in air pressure, such as from descending in an airplane or scuba diving. Abnormal growths in the nose or throat, such as: Growths that line the nose (nasal polyps). Abnormal growth of cells (tumors). Enlarged tissue at the back of the throat (adenoids). What increases the risk? You are more likely to develop this condition if: You smoke. You are overweight. You are a child who has: Certain birth defects of the mouth, such as cleft palate. Large tonsils or adenoids. What are the signs or symptoms? Common symptoms of this condition include: A feeling of fullness in the ear. Ear pain. Clicking or popping noises in the ear. Ringing in the ear (tinnitus). Hearing loss. Loss of balance. Dizziness. Symptoms may get worse when the air pressure around you changes, such as when you travel to an area of high elevation, fly on an airplane, or go scuba diving. How is this diagnosed? This  condition may be diagnosed based on: Your symptoms. A physical exam of your ears, nose, and throat. Tests, such as those that measure: The movement of your eardrum. Your hearing (audiometry). How is this treated? Treatment depends on the cause and severity of your condition. In mild cases, you may relieve your symptoms by moving air into your ears. This is called "popping the ears." In more severe cases, or if you have symptoms of fluid in your ears, treatment may include: Medicines to relieve congestion (decongestants). Medicines that treat allergies (antihistamines). Nasal sprays or ear drops that contain medicines that reduce swelling (steroids). A procedure to drain the fluid in your eardrum. In this procedure, a small tube may be placed in the eardrum to: Drain the fluid. Restore the air in the middle ear space. A procedure to insert a balloon device through the nose to inflate the opening of the eustachian tube (balloon dilation). Follow these instructions at home: Lifestyle Do not do any of the following until your health care provider approves: Travel to high altitudes. Fly in airplanes. Work in a Estate agent or room. Scuba dive. Do not use any products that contain nicotine or tobacco. These products include cigarettes, chewing tobacco, and vaping devices, such as e-cigarettes. If you need help quitting, ask your health care provider. Keep your ears dry. Wear fitted earplugs during showering and bathing. Dry your ears completely after. General instructions Take over-the-counter and prescription medicines only as told by your health care provider. Use techniques to help pop your ears as recommended by your health care provider. These may include: Chewing gum. Yawning. Frequent, forceful swallowing.  Closing your mouth, holding your nose closed, and gently blowing as if you are trying to blow air out of your nose. Keep all follow-up visits. This is important. Contact a  health care provider if: Your symptoms do not go away after treatment. Your symptoms come back after treatment. You are unable to pop your ears. You have: A fever. Pain in your ear. Pain in your head or neck. Fluid draining from your ear. Your hearing suddenly changes. You become very dizzy. You lose your balance. Get help right away if: You have a sudden, severe increase in any of your symptoms. Summary Eustachian tube dysfunction refers to a condition in which a blockage develops in the eustachian tube. It can be caused by ear infections, allergies, inhaled irritants, or abnormal growths in the nose or throat. Symptoms may include ear pain or fullness, hearing loss, or ringing in the ears. Mild cases are treated with techniques to unblock the ears, such as yawning or chewing gum. More severe cases are treated with medicines or procedures. This information is not intended to replace advice given to you by your health care provider. Make sure you discuss any questions you have with your health care provider. Document Revised: 10/24/2020 Document Reviewed: 10/24/2020 Elsevier Patient Education  2024 [employer].

## 2024-09-07 NOTE — Progress Notes (Signed)
 "  Subjective:    Patient ID: Margaret Dixon, female    DOB: March 18, 1961, 64 y.o.   MRN: [id number]  Chief Complaint  Patient presents with   Medical Management of Chronic Issues   PT presents to the office today for chronic follow up.  She is followed by Cardiologists annually for hx NSTEM in 2023 and CAD.  Quit smoking 06/10/23.  Currently taking Estrace  1 mg daily for hot flashes. These are stable.  Hyperlipidemia This is a chronic problem. The current episode started more than 1 year ago. The problem is controlled. Recent lipid tests were reviewed and are normal. Exacerbating diseases include obesity. Current antihyperlipidemic treatment includes statins (leqvio ). The current treatment provides moderate improvement of lipids. Risk factors for coronary artery disease include dyslipidemia, a sedentary lifestyle, post-menopausal and obesity.  Diabetes She presents for her follow-up diabetic visit. Diabetes type: hx of prediabetes. Pertinent negatives for hypoglycemia include no confusion. Pertinent negatives for diabetes include no blurred vision and no foot paresthesias. Risk factors for coronary artery disease include dyslipidemia, sedentary lifestyle and post-menopausal. She is following a generally healthy diet. (Does not check )  Anemia Presents for follow-up visit. There has been no confusion, fever or malaise/fatigue.  Ear Fullness  There is pain in both ears. The current episode started more than 1 month ago. The problem occurs every few minutes. The problem has been waxing and waning. The pain is mild. She has tried nothing for the symptoms. The treatment provided no relief.      Review of Systems  Constitutional:  Negative for fever and malaise/fatigue.  Eyes:  Negative for blurred vision.  Psychiatric/Behavioral:  Negative for confusion.   All other systems reviewed and are negative.   Social History   Socioeconomic History   Marital status: Divorced    Spouse name:  Not on file   Number of children: 2   Years of education: Not on file   Highest education level: GED or equivalent  Occupational History   Not on file  Tobacco Use   Smoking status: Former    Current packs/day: 0.00    Average packs/day: 0.3 packs/day for 8.0 years (2.4 ttl pk-yrs)    Types: Cigarettes    Start date: 07/26/2006    Quit date: 07/26/2014    Years since quitting: 10.1   Smokeless tobacco: Never  Substance and Sexual Activity   Alcohol use: Yes    Comment: socially   Drug use: No   Sexual activity: Not on file  Other Topics Concern   Not on file  Social History Narrative   Lives alone.  Works at psychologist, sport and exercise.     Social Drivers of Health   Tobacco Use: Medium Risk (09/07/2024)   Patient History    Smoking Tobacco Use: Former    Smokeless Tobacco Use: Never    Passive Exposure: Not on file  Financial Resource Strain: Low Risk (06/18/2024)   Overall Financial Resource Strain (CARDIA)    Difficulty of Paying Living Expenses: Not very hard  Food Insecurity: No Food Insecurity (06/18/2024)   Epic    Worried About Programme Researcher, Broadcasting/film/video in the Last Year: Never true    Ran Out of Food in the Last Year: Never true  Transportation Needs: No Transportation Needs (06/18/2024)   Epic    Lack of Transportation (Medical): No    Lack of Transportation (Non-Medical): No  Physical Activity: Insufficiently Active (06/18/2024)   Exercise Vital Sign    Days  of Exercise per Week: 2 days    Minutes of Exercise per Session: 20 min  Stress: No Stress Concern Present (06/18/2024)   Margaret [facility] [employer] - Occupational Stress Questionnaire    Feeling of Stress: Not at all  Social Connections: Moderately Isolated (06/18/2024)   Social Connection and Isolation Panel    Frequency of Communication with Friends and Family: Twice a week    Frequency of Social Gatherings with Friends and Family: Never    Attends Religious Services: More than 4 times per  year    Active Member of Clubs or Organizations: Yes    Attends Banker Meetings: More than 4 times per year    Marital Status: Divorced  Depression (PHQ2-9): Low Risk (06/29/2024)   Depression (PHQ2-9)    PHQ-2 Score: 0  Alcohol Screen: Not on file  Housing: Unknown (06/18/2024)   Epic    Unable to Pay for Housing in the Last Year: No    Number of Times Moved in the Last Year: Not on file    Homeless in the Last Year: No  Utilities: Not At Risk (12/10/2023)   Received from [facility] Utilities    Threatened with loss of utilities: No  Health Literacy: Not on file   Family History  Problem Relation Age of Onset   Cancer Mother    Coronary artery disease Father 35   Diabetes Father    Colon polyps Father    Emphysema Father    Cancer Father    Heart disease Father    Coronary artery disease Paternal Grandfather        Died of CAD at an early age.   Breast cancer Neg Hx         Objective:   Physical Exam Vitals reviewed.  Constitutional:      General: She is not in acute distress.    Appearance: She is well-developed.  HENT:     Head: Normocephalic and atraumatic.     Right Ear: Tympanic membrane normal.     Left Ear: Tympanic membrane normal.     Nose:     Right Sinus: Frontal sinus tenderness present.     Left Sinus: Frontal sinus tenderness present.  Eyes:     Pupils: Pupils are equal, round, and reactive to light.  Neck:     Thyroid : No thyromegaly.  Cardiovascular:     Rate and Rhythm: Normal rate and regular rhythm.     Heart sounds: Normal heart sounds. No murmur heard. Pulmonary:     Effort: Pulmonary effort is normal. No respiratory distress.     Breath sounds: Normal breath sounds. No wheezing.  Abdominal:     General: Bowel sounds are normal. There is no distension.     Palpations: Abdomen is soft.     Tenderness: There is no abdominal tenderness.  Musculoskeletal:        General: No tenderness. Normal range of motion.      Cervical back: Normal range of motion and neck supple.  Skin:    General: Skin is warm and dry.  Neurological:     Mental Status: She is alert and oriented to person, place, and time.     Cranial Nerves: No cranial nerve deficit.     Deep Tendon Reflexes: Reflexes are normal and symmetric.  Psychiatric:        Behavior: Behavior normal.        Thought Content: Thought content normal.  Judgment: Judgment normal.       BP 126/71   Pulse 70   Temp (!) 97.4 F (36.3 C) (Temporal)   Ht 5' 2 (1.575 m)   Wt 148 lb 9.6 oz (67.4 kg)   SpO2 98%   BMI 27.18 kg/m      Assessment & Plan:  JACQULIN BRANDENBURGER comes in today with chief complaint of Medical Management of Chronic Issues   Diagnosis and orders addressed:  1. History of non-ST elevation myocardial infarction (NSTEMI) - CMP14+EGFR  2. ACS (acute coronary syndrome) (HCC) (Primary) - CMP14+EGFR  3. Anemia due to vitamin B12 deficiency, unspecified B12 deficiency type - cyanocobalamin  (VITAMIN B12) 1000 MCG/ML injection; Inject 1 mL (1,000 mcg total) into the muscle every 14 (fourteen) days.  Dispense: 1 mL; Refill: 6 - CMP14+EGFR  4. Coronary artery disease involving native coronary artery of native heart without angina pectoris - CMP14+EGFR  5. Fibromyalgia - cyanocobalamin  (VITAMIN B12) 1000 MCG/ML injection; Inject 1 mL (1,000 mcg total) into the muscle every 14 (fourteen) days.  Dispense: 1 mL; Refill: 6 - CMP14+EGFR  6. Former tobacco use - CMP14+EGFR  7. Mixed hyperlipidemia - CMP14+EGFR  8. Vitamin D  deficiency - CMP14+EGFR  9. Prediabetes - CMP14+EGFR  10. Statin intolerance - CMP14+EGFR  11. Hot flash, menopausal - estradiol  (ESTRACE ) 1 MG tablet; Take 1 tablet (1 mg total) by mouth daily.  Dispense: 90 tablet; Refill: 1 - CMP14+EGFR  12. Eustachian tube disorder, bilateral Start zyrtec  and flonase   - fluticasone  (FLONASE ) 50 MCG/ACT nasal spray; Place 2 sprays into both nostrils daily.   Dispense: 16 g; Refill: 6 - cetirizine  (ZYRTEC ) 10 MG tablet; Take 1 tablet (10 mg total) by mouth daily.  Dispense: 90 tablet; Refill: 1   Labs pending Continue current medications  Keep follow up with specialists  Health Maintenance reviewed Diet and exercise encouraged  No follow-ups on file.    Bari Learn, FNP   "

## 2024-09-08 ENCOUNTER — Ambulatory Visit: Payer: Self-pay | Admitting: Family

## 2024-09-08 ENCOUNTER — Ambulatory Visit: Payer: Self-pay

## 2024-09-08 DIAGNOSIS — R7989 Other specified abnormal findings of blood chemistry: Secondary | ICD-10-CM

## 2024-09-08 LAB — CMP14+EGFR
ALT: 30 IU/L (ref 0–32)
AST: 39 IU/L (ref 0–40)
Albumin: 4.7 g/dL (ref 3.9–4.9)
Alkaline Phosphatase: 85 IU/L (ref 49–135)
BUN/Creatinine Ratio: 13 (ref 12–28)
BUN: 16 mg/dL (ref 8–27)
Bilirubin Total: 0.4 mg/dL (ref 0.0–1.2)
CO2: 25 mmol/L (ref 20–29)
Calcium: 10.2 mg/dL (ref 8.7–10.3)
Chloride: 103 mmol/L (ref 96–106)
Creatinine, Ser: 1.2 mg/dL — ABNORMAL HIGH (ref 0.57–1.00)
Globulin, Total: 2.7 g/dL (ref 1.5–4.5)
Glucose: 73 mg/dL (ref 70–99)
Potassium: 4.7 mmol/L (ref 3.5–5.2)
Sodium: 144 mmol/L (ref 134–144)
Total Protein: 7.4 g/dL (ref 6.0–8.5)
eGFR: 50 mL/min/1.73 — ABNORMAL LOW

## 2024-09-08 NOTE — Telephone Encounter (Signed)
" ° °  Copied from CRM [id number]. Topic: Clinical - Lab/Test Results >> Sep 08, 2024 10:37 AM Graeme ORN wrote: Reason for CRM: Patient returned missed call for labs. Read note as written by provider. Patient understood but has further questions/concerns Transfer to NT. Thank You. Answer Assessment - Initial Assessment Questions 1. REASON FOR CALL or QUESTION: What is your reason for calling today? or How can I best     Calling for clarification on her lab results. Patient with slightly elevated creatinine- discussed increasing hydration. She does not take any NSAIDS. Reviewed past levels and it does look like she fluctuates on the high end of normal. This week she retired and was working a job where she was not able to drink water consistently. She is working on increasing hydration this week. Advised one week re-check of blood work.   Patient requesting Lab Appt Next Monday morning if possible. Please reach out.  Protocols used: PCP Call - No Triage-A-AH  "

## 2024-09-10 ENCOUNTER — Telehealth: Payer: Self-pay | Admitting: Pharmacy Technician

## 2024-09-10 NOTE — Telephone Encounter (Addendum)
 Auth Submission: APPROVED PATIENT HAS BEEN APPROVED FOR [facility].  Site of care: Site of care: [facility] Payer: DEVOTED Medication & CPT/J Code(s) submitted: Leqvio  (Inclisiran) J1306 Diagnosis Code: E78.5 Route of submission (phone, fax, portal): FAXED Phone #[phone] Fax # Auth type: Buy/Bill PB Units/visits requested: X2 DOSES - Q6 MONTHS Reference number: [id number] Approval from: 09/10/24 to 08/26/25

## 2024-09-14 ENCOUNTER — Other Ambulatory Visit

## 2024-09-14 DIAGNOSIS — R7989 Other specified abnormal findings of blood chemistry: Secondary | ICD-10-CM

## 2024-09-14 LAB — BASIC METABOLIC PANEL WITH GFR
BUN/Creatinine Ratio: 13 (ref 12–28)
BUN: 14 mg/dL (ref 8–27)
CO2: 21 mmol/L (ref 20–29)
Calcium: 9.4 mg/dL (ref 8.7–10.3)
Chloride: 103 mmol/L (ref 96–106)
Creatinine, Ser: 1.07 mg/dL — ABNORMAL HIGH (ref 0.57–1.00)
Glucose: 155 mg/dL — ABNORMAL HIGH (ref 70–99)
Potassium: 4.4 mmol/L (ref 3.5–5.2)
Sodium: 142 mmol/L (ref 134–144)
eGFR: 58 mL/min/1.73 — ABNORMAL LOW

## 2024-09-15 ENCOUNTER — Ambulatory Visit: Payer: Self-pay | Admitting: Family

## 2024-09-15 MED ORDER — NITROFURANTOIN MONOHYD MACRO 100 MG PO CAPS: 100.0000 mg | ORAL_CAPSULE | Freq: Two times a day (BID) | ORAL | 0 refills | Status: AC

## 2024-09-15 NOTE — Telephone Encounter (Signed)
 Pt have foul urine odor and back pressure. Macrobid  Prescription sent to pharmacy. Follow up if symptoms worsen or do not improve

## 2024-09-21 ENCOUNTER — Ambulatory Visit

## 2024-09-23 ENCOUNTER — Ambulatory Visit

## 2024-09-28 ENCOUNTER — Ambulatory Visit

## 2024-10-01 ENCOUNTER — Ambulatory Visit

## 2024-10-01 VITALS — BP 162/81 | HR 57 | Temp 97.8°F | Resp 16 | Ht 64.0 in | Wt 152.0 lb

## 2024-10-01 DIAGNOSIS — I2510 Atherosclerotic heart disease of native coronary artery without angina pectoris: Secondary | ICD-10-CM

## 2024-10-01 DIAGNOSIS — E782 Mixed hyperlipidemia: Secondary | ICD-10-CM | POA: Diagnosis not present

## 2024-10-01 DIAGNOSIS — E538 Deficiency of other specified B group vitamins: Secondary | ICD-10-CM | POA: Diagnosis not present

## 2024-10-01 MED ORDER — INCLISIRAN SODIUM 284 MG/1.5ML ~~LOC~~ SOSY
284.0000 mg | PREFILLED_SYRINGE | Freq: Once | SUBCUTANEOUS | Status: AC
  Administered 2024-10-01: 284 mg via SUBCUTANEOUS
  Filled 2024-10-01: qty 1.5

## 2024-10-01 NOTE — Progress Notes (Signed)
 Patient is in office today for a nurse visit for B12 Injection. Patient Injection was given in the  Left deltoid. Patient tolerated injection well.

## 2024-10-01 NOTE — Progress Notes (Signed)
 Diagnosis: Hyperlipidemia  Provider:  Mannam, Praveen MD  Procedure: Injection  Leqvio  (inclisiran), Dose: 284 mg, Site: subcutaneous, Number of injections: 1  Injection Site(s): Right upper quad. abdomen  Post Care: Patient declined observation  Discharge: Condition: Good, Destination: Home . AVS Provided  Performed by:  Rocky FORBES Search, RN

## 2024-10-16 ENCOUNTER — Ambulatory Visit

## 2024-12-25 ENCOUNTER — Ambulatory Visit: Admitting: Family

## 2025-02-09 ENCOUNTER — Encounter: Admitting: Family

## 2025-03-09 ENCOUNTER — Ambulatory Visit: Admitting: Family

## 2025-04-01 ENCOUNTER — Ambulatory Visit
